# Patient Record
Sex: Male | Born: 1955
Health system: Southern US, Community
[De-identification: ages and names within clinical notes are randomized; demographics above are authoritative.]

## PROBLEM LIST (undated history)

## (undated) DIAGNOSIS — G629 Polyneuropathy, unspecified: Secondary | ICD-10-CM

## (undated) DIAGNOSIS — H269 Unspecified cataract: Secondary | ICD-10-CM

## (undated) DIAGNOSIS — E119 Type 2 diabetes mellitus without complications: Secondary | ICD-10-CM

## (undated) HISTORY — DX: Type 2 diabetes mellitus without complications: E11.9

## (undated) HISTORY — PX: BACK SURGERY: SHX140

## (undated) HISTORY — PX: EYE SURGERY: SHX253

## (undated) HISTORY — DX: Unspecified cataract: H26.9

## (undated) HISTORY — PX: TOTAL HIP ARTHROPLASTY: SHX124

## (undated) HISTORY — DX: Polyneuropathy, unspecified: G62.9

## (undated) HISTORY — PX: ANKLE SURGERY: SHX546

---

## 1976-04-27 HISTORY — PX: GASTROPLASTY: SHX192

## 1999-11-05 ENCOUNTER — Encounter: Admission: RE | Admit: 1999-11-05 | Discharge: 1999-11-05 | Payer: Self-pay | Admitting: Neurosurgery

## 1999-11-05 ENCOUNTER — Encounter: Payer: Self-pay | Admitting: Neurosurgery

## 1999-12-04 ENCOUNTER — Encounter: Payer: Self-pay | Admitting: Neurosurgery

## 1999-12-04 ENCOUNTER — Ambulatory Visit (HOSPITAL_COMMUNITY): Admission: RE | Admit: 1999-12-04 | Discharge: 1999-12-04 | Payer: Self-pay | Admitting: Neurosurgery

## 1999-12-18 ENCOUNTER — Encounter: Payer: Self-pay | Admitting: Neurosurgery

## 1999-12-18 ENCOUNTER — Ambulatory Visit (HOSPITAL_COMMUNITY): Admission: RE | Admit: 1999-12-18 | Discharge: 1999-12-18 | Payer: Self-pay | Admitting: Neurosurgery

## 2000-01-01 ENCOUNTER — Encounter: Payer: Self-pay | Admitting: Neurosurgery

## 2000-01-01 ENCOUNTER — Ambulatory Visit (HOSPITAL_COMMUNITY): Admission: RE | Admit: 2000-01-01 | Discharge: 2000-01-01 | Payer: Self-pay | Admitting: Neurosurgery

## 2006-08-01 ENCOUNTER — Emergency Department (HOSPITAL_COMMUNITY): Admission: EM | Admit: 2006-08-01 | Discharge: 2006-08-01 | Payer: Self-pay | Admitting: Emergency Medicine

## 2006-08-19 ENCOUNTER — Emergency Department (HOSPITAL_COMMUNITY): Admission: EM | Admit: 2006-08-19 | Discharge: 2006-08-20 | Payer: Self-pay | Admitting: Emergency Medicine

## 2006-08-23 ENCOUNTER — Ambulatory Visit: Payer: Self-pay | Admitting: Cardiovascular Disease

## 2006-08-23 ENCOUNTER — Inpatient Hospital Stay (HOSPITAL_COMMUNITY): Admission: EM | Admit: 2006-08-23 | Discharge: 2006-08-25 | Payer: Self-pay | Admitting: Emergency Medicine

## 2006-09-02 ENCOUNTER — Encounter: Admission: RE | Admit: 2006-09-02 | Discharge: 2006-09-02 | Payer: Self-pay | Admitting: Neurosurgery

## 2006-09-15 ENCOUNTER — Encounter: Admission: RE | Admit: 2006-09-15 | Discharge: 2006-09-15 | Payer: Self-pay | Admitting: Neurosurgery

## 2006-11-19 ENCOUNTER — Inpatient Hospital Stay (HOSPITAL_COMMUNITY): Admission: RE | Admit: 2006-11-19 | Discharge: 2006-11-24 | Payer: Self-pay | Admitting: Neurosurgery

## 2007-06-11 ENCOUNTER — Encounter: Admission: RE | Admit: 2007-06-11 | Discharge: 2007-06-11 | Payer: Self-pay | Admitting: Neurosurgery

## 2007-08-09 ENCOUNTER — Inpatient Hospital Stay (HOSPITAL_COMMUNITY): Admission: RE | Admit: 2007-08-09 | Discharge: 2007-08-12 | Payer: Self-pay | Admitting: Orthopaedic Surgery

## 2008-07-03 ENCOUNTER — Inpatient Hospital Stay (HOSPITAL_COMMUNITY): Admission: RE | Admit: 2008-07-03 | Discharge: 2008-07-07 | Payer: Self-pay | Admitting: Orthopaedic Surgery

## 2008-07-06 ENCOUNTER — Encounter (INDEPENDENT_AMBULATORY_CARE_PROVIDER_SITE_OTHER): Payer: Self-pay | Admitting: Orthopaedic Surgery

## 2008-07-06 ENCOUNTER — Ambulatory Visit: Payer: Self-pay | Admitting: Surgery

## 2010-05-18 ENCOUNTER — Encounter: Payer: Self-pay | Admitting: Neurosurgery

## 2010-08-07 LAB — GLUCOSE, CAPILLARY
Glucose-Capillary: 105 mg/dL — ABNORMAL HIGH (ref 70–99)
Glucose-Capillary: 109 mg/dL — ABNORMAL HIGH (ref 70–99)
Glucose-Capillary: 112 mg/dL — ABNORMAL HIGH (ref 70–99)
Glucose-Capillary: 116 mg/dL — ABNORMAL HIGH (ref 70–99)
Glucose-Capillary: 117 mg/dL — ABNORMAL HIGH (ref 70–99)
Glucose-Capillary: 126 mg/dL — ABNORMAL HIGH (ref 70–99)
Glucose-Capillary: 130 mg/dL — ABNORMAL HIGH (ref 70–99)
Glucose-Capillary: 146 mg/dL — ABNORMAL HIGH (ref 70–99)
Glucose-Capillary: 148 mg/dL — ABNORMAL HIGH (ref 70–99)
Glucose-Capillary: 150 mg/dL — ABNORMAL HIGH (ref 70–99)
Glucose-Capillary: 151 mg/dL — ABNORMAL HIGH (ref 70–99)
Glucose-Capillary: 153 mg/dL — ABNORMAL HIGH (ref 70–99)
Glucose-Capillary: 159 mg/dL — ABNORMAL HIGH (ref 70–99)
Glucose-Capillary: 173 mg/dL — ABNORMAL HIGH (ref 70–99)
Glucose-Capillary: 98 mg/dL (ref 70–99)

## 2010-08-07 LAB — BASIC METABOLIC PANEL
BUN: 12 mg/dL (ref 6–23)
BUN: 14 mg/dL (ref 6–23)
BUN: 15 mg/dL (ref 6–23)
BUN: 9 mg/dL (ref 6–23)
CO2: 29 mEq/L (ref 19–32)
CO2: 29 mEq/L (ref 19–32)
CO2: 29 mEq/L (ref 19–32)
Calcium: 8.2 mg/dL — ABNORMAL LOW (ref 8.4–10.5)
Calcium: 8.3 mg/dL — ABNORMAL LOW (ref 8.4–10.5)
Calcium: 8.3 mg/dL — ABNORMAL LOW (ref 8.4–10.5)
Calcium: 8.3 mg/dL — ABNORMAL LOW (ref 8.4–10.5)
Calcium: 8.4 mg/dL (ref 8.4–10.5)
Chloride: 100 mEq/L (ref 96–112)
Chloride: 95 mEq/L — ABNORMAL LOW (ref 96–112)
Chloride: 97 mEq/L (ref 96–112)
Creatinine, Ser: 0.77 mg/dL (ref 0.4–1.5)
Creatinine, Ser: 0.81 mg/dL (ref 0.4–1.5)
Creatinine, Ser: 0.86 mg/dL (ref 0.4–1.5)
Creatinine, Ser: 0.88 mg/dL (ref 0.4–1.5)
Creatinine, Ser: 0.94 mg/dL (ref 0.4–1.5)
GFR calc Af Amer: >60 mL/min (ref >60)
GFR calc Af Amer: >60 mL/min (ref >60)
GFR calc Af Amer: >60 mL/min (ref >60)
GFR calc non Af Amer: >60 mL/min (ref >60)
GFR calc non Af Amer: >60 mL/min (ref >60)
GFR calc non Af Amer: >60 mL/min (ref >60)
GFR calc non Af Amer: >60 mL/min (ref >60)
Glucose, Bld: 101 mg/dL — ABNORMAL HIGH (ref 70–99)
Glucose, Bld: 122 mg/dL — ABNORMAL HIGH (ref 70–99)
Glucose, Bld: 135 mg/dL — ABNORMAL HIGH (ref 70–99)
Potassium: 3.8 mEq/L (ref 3.5–5.1)
Potassium: 4 mEq/L (ref 3.5–5.1)
Potassium: 4.1 mEq/L (ref 3.5–5.1)
Sodium: 132 mEq/L — ABNORMAL LOW (ref 135–145)
Sodium: 132 mEq/L — ABNORMAL LOW (ref 135–145)

## 2010-08-07 LAB — PROTIME-INR
INR: 1 (ref 0.00–1.49)
INR: 1.3 (ref 0.00–1.49)
INR: 2.3 — ABNORMAL HIGH (ref 0.00–1.49)
INR: 2.4 — ABNORMAL HIGH (ref 0.00–1.49)
Prothrombin Time: 13.8 seconds (ref 11.6–15.2)
Prothrombin Time: 16.3 seconds — ABNORMAL HIGH (ref 11.6–15.2)
Prothrombin Time: 26.6 seconds — ABNORMAL HIGH (ref 11.6–15.2)
Prothrombin Time: 27.5 seconds — ABNORMAL HIGH (ref 11.6–15.2)

## 2010-08-07 LAB — CROSSMATCH: ABO/RH(D): O POS

## 2010-08-07 LAB — URINE CULTURE
Colony Count: NO GROWTH
Colony Count: NO GROWTH

## 2010-08-07 LAB — COMPREHENSIVE METABOLIC PANEL
ALT: 9 U/L (ref 0–53)
Alkaline Phosphatase: 90 U/L (ref 39–117)
BUN: 14 mg/dL (ref 6–23)
CO2: 27 mEq/L (ref 19–32)
GFR calc non Af Amer: >60 mL/min (ref >60)
Glucose, Bld: 140 mg/dL — ABNORMAL HIGH (ref 70–99)
Potassium: 4 mEq/L (ref 3.5–5.1)
Sodium: 138 mEq/L (ref 135–145)
Total Protein: 6.8 g/dL (ref 6.0–8.3)

## 2010-08-07 LAB — CBC
HCT: 27.4 % — ABNORMAL LOW (ref 39.0–52.0)
HCT: 28.8 % — ABNORMAL LOW (ref 39.0–52.0)
HCT: 40 % (ref 39.0–52.0)
Hemoglobin: 13.6 g/dL (ref 13.0–17.0)
Hemoglobin: 9.6 g/dL — ABNORMAL LOW (ref 13.0–17.0)
MCHC: 33.5 g/dL (ref 30.0–36.0)
MCHC: 33.9 g/dL (ref 30.0–36.0)
MCHC: 34.1 g/dL (ref 30.0–36.0)
MCV: 79.1 fL (ref 78.0–100.0)
Platelets: 153 10*3/uL (ref 150–400)
Platelets: 182 10*3/uL (ref 150–400)
RBC: 3.64 MIL/uL — ABNORMAL LOW (ref 4.22–5.81)
RBC: 3.68 MIL/uL — ABNORMAL LOW (ref 4.22–5.81)
RBC: 3.88 MIL/uL — ABNORMAL LOW (ref 4.22–5.81)
RBC: 5.16 MIL/uL (ref 4.22–5.81)
RDW: 15.8 % — ABNORMAL HIGH (ref 11.5–15.5)
RDW: 16.2 % — ABNORMAL HIGH (ref 11.5–15.5)
RDW: 16.3 % — ABNORMAL HIGH (ref 11.5–15.5)
WBC: 6.8 10*3/uL (ref 4.0–10.5)
WBC: 8 10*3/uL (ref 4.0–10.5)
WBC: 8.3 10*3/uL (ref 4.0–10.5)
WBC: 9.2 10*3/uL (ref 4.0–10.5)

## 2010-08-07 LAB — APTT: aPTT: 35 seconds (ref 24–37)

## 2010-08-07 LAB — URINALYSIS, ROUTINE W REFLEX MICROSCOPIC
Hgb urine dipstick: NEGATIVE
Ketones, ur: 15 mg/dL — AB
Nitrite: NEGATIVE
Protein, ur: NEGATIVE mg/dL
Urobilinogen, UA: 1 mg/dL (ref 0.0–1.0)

## 2010-08-07 LAB — DIFFERENTIAL
Basophils Absolute: 0 10*3/uL (ref 0.0–0.1)
Basophils Relative: 0 % (ref 0–1)
Eosinophils Absolute: 0 10*3/uL (ref 0.0–0.7)
Neutro Abs: 6.4 10*3/uL (ref 1.7–7.7)
Neutrophils Relative %: 67 % (ref 43–77)

## 2010-08-07 LAB — OSMOLALITY: Osmolality: 275 mOsm/kg (ref 275–300)

## 2010-08-07 LAB — URINE MICROSCOPIC-ADD ON

## 2010-08-07 LAB — URINALYSIS, MICROSCOPIC ONLY
Bilirubin Urine: NEGATIVE
Glucose, UA: NEGATIVE mg/dL
Hgb urine dipstick: NEGATIVE
Protein, ur: 30 mg/dL — AB

## 2010-09-09 NOTE — Op Note (Signed)
[name], [name] NO.:  [id number]   MEDICAL RECORD NO.:  [id number]          PATIENT TYPE:  INP   LOCATION:  5006                         FACILITY:  [facility]   PHYSICIAN:  Claude Manges. Whitfield, M.D.DATE OF BIRTH:  07-01-1955   DATE OF PROCEDURE:  08/09/2007  DATE OF DISCHARGE:                               OPERATIVE REPORT   PREOPERATIVE DIAGNOSIS:  End-stage osteoarthritis, left hip.   POSTOPERATIVE DIAGNOSIS:  End-stage osteoarthritis, left hip.   PROCEDURE:  Left total hip arthroplasty.   SURGEON:  Claude Manges. Cleophas Dunker, MD   ASSISTANT:  Lenard Galloway. Chaney Malling, MD  Oris Drone Petrarca, PA-C   ANESTHESIA:  General orotracheal.   COMPLICATIONS:  None.   COMPONENTS:  DePuy AML 16.5 large femoral stem, a 36 mm diameter hip  ball with a 8.5 mm neck length, and a 100 series 60 mm outer diameter  metal acetabular cup with a +4 polyethylene liner with a 10-degree  posterior lip.  Components were press fit.   PROCEDURE:  The patient was met in the holding area.  The left lower  extremity was marked as the appropriate operative extremity.  Any  questions were answered.  The patient was then taken to Room 15.  He was  placed under general orotracheal anesthesia.  Nursing staff inserted a  Foley catheter.  Urine was clear.   The patient was then placed in the lateral decubitus position with the  left side up and secured to the operating room table with the Innomed  hip system.   A routine southern incision was utilized via sharp dissection carried on  the subcutaneous tissue.  Abundant adipose tissue was then incised to  the level of the iliotibial band.  This was incised along the skin  incision.  Self-retaining retractors were inserted.  Detectably, the  procedure was difficult as the patient had a fixed flexion and external  rotation contracture.  The short external rotator tendons were  identified with some difficulty and then tagged with 0 Ethibond suture.  The  capsule was identified, incised along the femoral neck and head.  The head was then dislocated posteriorly.  The head was significantly  malformed.  There was over 75% of the femoral head that was devoid of  articular cartilage.   Using the calcar guide, the neck was osteotomized at a point of just  over a fingerbreadth proximal to the lesser tuberosity.   Piriformis fossa was identified.  A starter hole was then made.  The  canal finder was inserted.  Reaming was performed to 16 mm for a 16.5 mm  femoral component.  At that point, a good endosteal purchase.   Rasping was performed sequentially to 16.5 mm.  Calcar reamer was used  to obtain the appropriate calcar angle.  It had a very nice fit and no  toggling.   Retractor was then placed about the acetabulum.  The labrum was  degenerative and torn.  It was sharply excised.  There were large areas  of articular cartilage loss and osteophytes that were removed.  Reaming  was performed sequentially to 59  mm to accept a 60 mm component.  We had  very nice bleeding bone circumferentially.   We trialed a 58 and had good rim fit.  Accordingly, we proceeded to  insert the 60 mm outer diameter 100 series AML metallic cup in what we  felt was appropriate anteversion and flexion.  We had very nice purchase  and we were able to completely seat the component.   The trial +4 polyethylene liner was inserted.  We reinserted the 16.5 mm  large stature femoral rasp and then we tried several neck lengths and  felt that the 8.5 established leg length equality and there was no  instability whatsoever.   The trial components were then removed.  The wound was copiously  irrigated with saline solution.  The apex hole eliminator was inserted  followed by the final +4 10-degree posterior lip polyethylene liner.   The wound was irrigated.  There were several small osteophytes about the  acetabulum that were sharply excised.   The 16.5 mm large stature  femoral component was then impacted in about  20 degrees of anteversion and fit perfectly on the calcar.   We again trialed the 8.5 mm neck length with a 36 mm hip ball.  It was a  perfect fit and there was no instability.   Accordingly, the trial ball was removed.  The Morris-tapered neck was  then cleaned and the final 36 mm diameter with an 8.5 mm neck length was  then impacted on the Morris taper.  The wound was irrigated.  The  acetabulum cleaned.  The entire construct was then reduced again through  a full range of motion.  We could not sublux the hip.   The wound was again irrigated with saline solution.  The capsule was  closed with interrupted #1 Ethibond.  Short external rotators were then  closed with the same material.  The iliotibial band was closed with  running 0 Vicryl, the subcu in several layers of 0 and 2-0 Vicryl, skin  closed with skin clips.  Sterile bulky dressing was applied followed by  a sterile drapes followed by Mepilex dressing.  Marcaine was injected  around the wound edges.   The patient was then returned to the postanesthesia recovery in  satisfactory condition without complications.      Claude Manges. Cleophas Dunker, M.D.  Electronically Signed     PWW/MEDQ  D:  08/09/2007  T:  08/10/2007  Job:  [id number]

## 2010-09-09 NOTE — Op Note (Signed)
NAMEANTONIOUS, [name] NO.:  [id number]   MEDICAL RECORD NO.:  [id number]          PATIENT TYPE:  INP   LOCATION:  3039                         FACILITY:  [facility]   PHYSICIAN:  Payton Doughty, M.D.      DATE OF BIRTH:  1955-12-03   DATE OF PROCEDURE:  11/19/2006  DATE OF DISCHARGE:                               OPERATIVE REPORT   PREOPERATIVE DIAGNOSES:  L3 fracture, right L3 and L4 radiculopathies.   POSTOPERATIVE DIAGNOSES:  L3 fracture, right L3 and L4 radiculopathies.   PROCEDURE:  Right L3 and L4 laminectomy with lateral recess  decompression, L1-L4 segmental pedicle screws and L1-L4 left lamina and  posterolateral arthrodesis.   SERVICE:  Neurosurgery.   ANESTHESIA:  General endotracheal.   PREPARATION:  Prepped and scrubbed with alcohol wipe.   COMPLICATIONS:  None.   NURSE ASSISTANCE:  [place].   DOCTOR ASSISTANCE:  Phoebe Perch.   This is a 55 year old gentleman with an old L3 fracture, lateral recess  narrowing on the right side at L3-4 and L2-3 and right L3 and L4  radiculopathies. Taken to the operating room, smoothly anesthetized,  intubated, placed prone on the operating table. Following shave, prep,  and drape in the usual sterile fashion, the skin was infiltrated with 1%  lidocaine with 1:400,000 epinephrine.  The skin was incised from the top  of L5 to the top of L1 and the lamina and transverse processes of L2, L3  and L4 were exposed bilaterally in the subperiosteal plane.  Intraoperative x-ray confirmed correct level. Having confirmed correct  level,  hemilaminectomy of L3 and L4 was carried out using the high-  speed drill and the Kerrison.  This allowed removal of the abundant  redundant ligamentum flavum on the right side, undercutting of the  spinous process and generous decompression.  The L2, L3 and L4 roots  were identified and decompressed and the neural foramina carefully  explored and found to be open.  Pedicle screws were then  placed in L1,  L2, L3 and L4.  Intraoperative x-rays showed good placement of pedicle  screws.  A rod was contoured to fit across them and they were capped.  Final tightening was undertaken, the high-speed drill was used to  decorticate the left-sided lamina of L1, L2, L3 and L4 as well as the  transverse process of L1, L2, L3 and L4. BMP on the extender  matrix was laid across this as arthrodesis.  The final x-ray showed good  placement of rods and screws.  Successive layers of #0 Vicryl, 2-0  Vicryl and 3-0 nylon were used to close.  Betadine Telfa dressing was  applied, made occlusive with OpSite and the patient returned to recovery  room in good condition.           ______________________________  Payton Doughty, M.D.     MWR/MEDQ  D:  11/19/2006  T:  11/20/2006  Job:  [id number]

## 2010-09-09 NOTE — Discharge Summary (Signed)
NAME:  [name], [name] NO.:  [id number]   MEDICAL RECORD NO.:  [id number]          PATIENT TYPE:  INP   LOCATION:  A210                          FACILITY:  [facility]   PHYSICIAN:  Marcello Moores, MD   DATE OF BIRTH:  10-02-1955   DATE OF ADMISSION:  08/23/2006  DATE OF DISCHARGE:  04/30/2008LH                               DISCHARGE SUMMARY   ADMISSION AND HOSPITAL COURSE:  Mr. [name] is a 55 year old male with a  medical history of diabetes mellitus, who presents with chest  discomfort, palpitations, and sweating.  He presented to ER with this  problem and he was admitted with atypical chest pain to rule out acute  coronary syndrome.  The cardiac enzymes were within normal range.  EKG  was also normal.  Chest x-ray was also normal.  He was assessed by the  cardiologist as well and echocardiogram was done which was normal, and  stress test was done and the result is pending but he was discharged by  the cardiologist, and he is ready to go home.  In his hospital stay, his  blood pressure remained on the high range and was started on metoprolol  and hydrochlorothiazide by the cardiologist, and I will discharge him  with metoprolol and hydrochlorothiazide, and to have follow-up with PMD  and cardiologist.   PHYSICAL EXAMINATION:  VITAL SIGNS:  Blood pressure is 110/64,  temperature is 97, pulse rate is 53, respiratory rate is 20.  Blood  glucose level is 152.  Saturation is 98%  HEENT:  He has pink conjunctivae, nonicteric sclerae.  CHEST:  Good air entry.  CVS:  S1, S2 regular, no murmur.  ABDOMEN:  Soft.  EXTREMITIES:  No pedal edema.  CNS: Alert and oriented.   LABS:  CBC within normal range.  Glucose level was 139, otherwise other  chemistries are normal.   ASSESSMENT:  1. Atypical chest pain.  Acute coronary syndrome was ruled out.  2. Diabetes mellitus.  3. Hypertension.   He will be discharged with medication and will have follow-up.   MEDICATIONS:  1,   Glucophage 1000 mg b.i.d.  1. Lortab 10 mg four times a day.  2. Metoprolol 25 mg t.i.d.  3. Hydrochlorothiazide 12.5 mg p.o. daily.   The patient is well advised about the medications, and he needs regular  follow-up with his PMD and with a cardiologist.      Marcello Moores, MD  Electronically Signed     MT/MEDQ  D:  08/25/2006  T:  08/26/2006  Job:  [id number]

## 2010-09-09 NOTE — Consult Note (Signed)
[name], [name]               ACCOUNT NO.:  [id number]   MEDICAL RECORD NO.:  [id number]          PATIENT TYPE:  INP   LOCATION:  5006                         FACILITY:  [facility]   PHYSICIAN:  Pramod P. Pearlean Brownie, MD    DATE OF BIRTH:  10/18/1955   DATE OF CONSULTATION:  DATE OF DISCHARGE:                                 CONSULTATION   REFERRING PHYSICIAN:  Demetria Pore. Coral Spikes, M.D.   REASON FOR REFERRAL:  Hand tremor.   HISTORY OF PRESENT ILLNESS:  [name] is a 55 year old Caucasian male  who has been having intermittent upper extremity hand tremors for the  last 1 week.  He describes this as being mainly task specific, when he  is holding some objects in his hand he feels subtle jerking movement.  He has not been dropping objects.  He denies significant tremors at  rest.  There is no pill rolling quality, no drooling of saliva, no  change in his voice, stooping gait.  No family history of tremors.  The  patient says at times he has noticed jerky movement in his legs as well.  There is no head, neck, voice or jaw tremor noted.  He does take Lortab  3 tablets a day as well as Neurontin three times a day for chronic back  pain.   PAST MEDICAL HISTORY:  Significant for hypertension, diabetes with  chronic back pain status post back surgery by Dr. Channing Mutters and now left hip  surgery 2 days ago by Dr. Cleophas Dunker.   MEDICATIONS:  Medication list at home includes Lortab 1 tablet three  times a day, and Flexeril 10 mg three times a day, Glucophage daily.   PAST SURGICAL HISTORY:  Left knee surgery, right ankle surgery, stomach  surgery, back surgery.   SOCIAL HISTORY:  The patient is divorced.  He is on disability.  He is  currently not smoking or drinking.   PHYSICAL EXAMINATION:  GENERAL:  Reveals a pleasant, obese Caucasian  male who is at present not in distress.  He is afebrile, pulse rate is  70 per minute, regular, respiratory rate 16 per minute.  Distal pulses  are well felt.  HEAD:   Nontraumatic.  NECK:  Supple.  There is no bruit.  ENT:  Unremarkable.  CARDIAC:  No murmur or gallop.  LUNGS:  Clear to auscultation.  ABDOMEN:  Soft, nontender.  NEUROLOGIC:  Pleasant, awake, alert, cooperative.  There is no aphasia,  or dysarthria.  Pupils are equal, reactive.  Eye movements are full  range with no nystagmus.  Face is symmetric.  Palatal movements normal.  Tongue is midline.  Motor system exam reveals no upper extremity drift.  He has symmetric strength, tone, reflexes, coordination and sensation.  He has intermittent asterixis and flapping tremor of outstretched upper  extremities.  I did not see this in the lower extremities.  There is no  pill-rolling tremor.  There is no cogwheel rigidity.  Glabellar tap is  negative.  He can move all four extremities equally.  Gait was not  tested.  Left hip exam is limited due  to pain.   DATA REVIEWED:  The hospital chart is reviewed.   IMPRESSION:  A 52-year gentleman with 1-week history of intermittent  jerky upper extremity nonspecific tremors which seems like asterixis,  which may be medication effect from his narcotics and Neurontin.  Had  doubt this represents benign essential tremor or Parkinson's disease.   PLAN:  I would recommend to stopping the narcotics pain medication,  Lortab as well as Neurontin.  Use non-narcotic pain medications if  possible.  Check basic metabolic panel, labs, liver function tests,  ammonia levels.  If pain medications are necessary, use nonsteroidal  anti-inflammatory pain medications.  I would be happy to follow the  patient in consults.  Kindly call for questions.           ______________________________  Sunny Schlein. Pearlean Brownie, MD     PPS/MEDQ  D:  08/11/2007  T:  08/12/2007  Job:  [id number]

## 2010-09-09 NOTE — Procedures (Signed)
NAMEJOAB, CARDEN NO.:  [id number]   MEDICAL RECORD NO.:  [id number]          PATIENT TYPE:  INP   LOCATION:  A210                          FACILITY:  [facility]   PHYSICIAN:  Noralyn Pick. [name], MD, FACCDATE OF BIRTH:  July 28, 1955   DATE OF PROCEDURE:  DATE OF DISCHARGE:                                ECHOCARDIOGRAM   INDICATIONS:  Chest pain, diabetic.   Left ventricular cavity size was normal.  There was mild LVH with some  basal septal prominence.  Septal thickness was approximately 16 mm.  There was no outflow tract gradient.  There were no regional wall motion  abnormalities.  EF was 60%.  There was mild annular calcifications which  reveal MR.  There was mild left atrial enlargement.  The aortic valve  was trileaflet and sclerotic.  There was no stenosis.  Right-sided  cardiac chambers were normal with no evidence of pulmonary hypertension.  Subcostal imaging revealed no atrial septal defect, no source of  embolus, and no effusion.   M-Mode measurements, including aortic dimension at 36 mm.  Left atrial  dimension, 41 mm.  Septal thickness 16 mmHg.  LV diastolic dimension 37  mm.  LV systolic dimension 26 mm.   FINAL IMPRESSION:  1. Poor acoustical windows.  2. Mild left ventricular hypertrophy, ejection fraction of 60%.  No      wall motion abnormalities.  3. Mild annular calcifications with trace mitral regurgitation.  4. Aortic valve sclerosis.  5. Mild left atrial enlargement.  6. Normal right-sided cardiac chambers.  7. No pericardial effusion.      Noralyn Pick. [name], MD, [facility]  Electronically Signed     PCN/MEDQ  D:  08/24/2006  T:  08/24/2006  Job:  [id number]

## 2010-09-09 NOTE — H&P (Signed)
NAMEORREN, PIETSCH NO.:  [id number]   MEDICAL RECORD NO.:  [id number]          PATIENT TYPE:  INP   LOCATION:  A210                          FACILITY:  [facility]   PHYSICIAN:  Dorris Singh, DO    DATE OF BIRTH:  1955/11/29   DATE OF ADMISSION:  08/23/2006  DATE OF DISCHARGE:  LH                              HISTORY & PHYSICAL   The patient is a 55 year old, Caucasian male who presented to the ER  with a complaint of chest pain. The patient stated he was at home,  started to feel pressure and started to sweat and decided that it was  time for him to get that evaluated. While he was watching television,  the onset was acute and he had one episode. The pattern was persistent.  The patient stated that his whole body felt heavy and the symptoms were  described as moderate. He did not have anything that relieved the pain  and it was associated with diaphoresis, but he did not have nausea and  vomiting. The patient has a significant history for diabetes and a  family history for cardiovascular disease.   PAST MEDICAL HISTORY:  Diabetes, chronic back pain.   PAST SURGICAL HISTORY:  Cholecystectomy, knee surgery, gastroplasty and  ankle surgery.   SOCIAL HISTORY:  Nonsmoker, nondrinker and the patient denies any drug  use.   ALLERGIES:  Vespids aka bee sting.   CURRENT MEDICATIONS:  The patient current is on 2 medications:  1. Glucophage oral 100 mg b.i.d.  2. Lortab 10 mg 4 times a day.   REVIEW OF SYSTEMS:  Negative for fever, weight loss or appetite changes.  EYES:  Negative for eye pain or changes in vision. HEENT:  Negative for  ear pain, nose pain or drainage. CARDIOVASCULAR:  Positive for chest  pain, palpitations, diaphoresis. Negative for dyspnea. RESPIRATORY:  Negative for cough, dyspnea or productive cough. GASTROINTESTINAL:  Negative for nausea, vomiting, diarrhea or constipation.  MUSCULOSKELETAL: Positive for back pain. SKIN:  Negative pruritus,  rash  or abrasions. NEUROLOGIC:  Positive for light-headedness, negative for  headache.   PHYSICAL EXAMINATION:  VITAL SIGNS:  On the floor, the patient's vitals  are 97.4, 77, 20, 167/109 and that was rechecked 30 minutes later and  was found to be 147/92. Room air saturation at 95%. The patient is 75  inches tall and 116.7 kg.  GENERAL:  This is a well-developed, well-nourished, Caucasian male who  is appropriate.  HEENT:  Head is normocephalic, atraumatic. Eyes are normal in  appearance, symmetrical. EOMI, PERRLA. Ears, nose, throat, mouth and  pharynx normal. Teeth are in poor repair.  NECK:  Supple, nontender, no lymphadenopathy.  CARDIOVASCULAR:  Regular rate and rhythm, no murmur, rub or gallop. S1,  S2 present.  RESPIRATORY:  Normal breath sounds, equal bilaterally. No rales, rhonchi  or wheezes. Diminished breath sounds.  ABDOMEN:  Soft, nontender, nondistended. Positive midline scar as well  as scars on the right side indicative of cholecystectomy.  EXTREMITIES:  No abnormality, full range of motion, positive +1 edema  bilaterally with multiple abrasions on lower  legs.  SKIN:  Normal color, normal turgor, no rash or petechia noted.   EKG was done, rate was 64, normal sinus rhythm, normal axis, normal  interval, normal P, QRS and normal T wave. Radiology did a chest x-ray,  no acute changes.   LABORATORY DATA:  Sodium 136, potassium 4.2, chloride 105, carbon  dioxide 24, glucose 163, BUN 13, creatinine 0.71. Cardiac markers were  done. CK-MB was less than 1, myoglobin was 40.4, troponin was 0.05. CBC  was done, white count 7.7, hemoglobin 15.3, hematocrit 43.9. Platelets  207.   ASSESSMENT/PLAN:  The patient was admitted to the [facility] service for  a diagnosis of  chest pain  diabetes.  We will have [place] to consult and participate regarding his care. The  patient had 3 sets of normal enzymes. Will continue to monitor him. We  have sublingual nitroglycerin if the  chest pain returns and will do labs  in the morning.      Dorris Singh, DO  Electronically Signed     CB/MEDQ  D:  08/24/2006  T:  08/24/2006  Job:  [id number]

## 2010-09-09 NOTE — Discharge Summary (Signed)
NAMERADWAN, COWLEY NO.:  [id number]   MEDICAL RECORD NO.:  [id number]          PATIENT TYPE:  INP   LOCATION:  3039                         FACILITY:  [facility]   PHYSICIAN:  Payton Doughty, M.D.      DATE OF BIRTH:  02-Dec-1955   DATE OF ADMISSION:  11/19/2006  DATE OF DISCHARGE:  11/24/2006                               DISCHARGE SUMMARY   ADMITTING DIAGNOSIS:  L3 fracture.   DISCHARGE DIAGNOSIS:  L3 fracture.   PROCEDURE:  L1-L4 fusion.   DICTATING DOCTOR:  Dr. Channing Mutters.   SERVICES:  Neurosurgery.   BODY OF TEXT:  This is a 55 year old, right-handed, gentleman, whose  history and physical is recounted in the chart.  He has had L3 fracture  for numerous years and has developed back pain, right leg associated  with it.  Otherwise, in reasonably good health.  Strength is full.  He  was admitted after ascertainment of  normal __________ values and  underwent a segmental fusion from L1-L4.  Postoperatively, he has done  well.  Mobilizing with PT.  Foley was removed.  He says his leg pain is  gone and his incision has had some leakage, but is now dry.  He has  remained afebrile.  His other vital signs are stable.  He is up and  about, independent in his activities of daily living.  He is being  discharged home in the care of his family with Percocet for pain and 5  days of Cipro for his skin coverage.           ______________________________  Payton Doughty, M.D.     MWR/MEDQ  D:  11/24/2006  T:  11/24/2006  Job:  [id number]

## 2010-09-09 NOTE — Consult Note (Signed)
[name], GILL NO.:  [id number]   MEDICAL RECORD NO.:  [id number]          PATIENT TYPE:  INP   LOCATION:  A210                          FACILITY:  [facility]   PHYSICIAN:  Noralyn Pick. [name], MD, FACCDATE OF BIRTH:  1955-05-24   DATE OF CONSULTATION:  DATE OF DISCHARGE:                                 CONSULTATION   REASON FOR CONSULT:  Chest pain.   HISTORY OF PRESENT ILLNESS:  The patient is a 55 year old patient  admitted by [facility] yesterday evening.  He presented to the ER with the  complaints of diaphoresis, a pressure sensation in his epigastric and  chest area, and actually a feeling of things closing in on him.  The  patient's chest pain complaints were actually somewhat minor compared to  an overwhelming feeling of weakness, particularly in his legs,  diaphoresis, and almost a claustrophobic-like feeling.   The patient has not had this happen before.  When he was evaluated, his  blood sugar was 160 and his blood pressure was somewhat elevated.  He  was initially watching television and had the acute onset of this  feeling.  It was persistent and he called his girlfriend, who is an OR  nurse, and drove himself to the hospital.   The patient apparently felt relief with Demerol.   He did not have any associated nausea or vomiting.  There is no previous  history of cardiac disease.  He does have a history of diabetes and  family history for coronary artery disease.  His initial evaluation in  the ER was negative, with negative for cardiac enzymes and no  arrhythmia.  The patient essentially felt better after his Demerol;  however, he continues to feel somewhat fatigued this morning.   PAST MEDICAL HISTORY:  His past medical history is remarkable for  diabetes, which he has had for about 2 years.  It is orally treated.  He  has chronic lower back pain and actually is to see Dr. Channing Mutters later this  week.  I am not sure if any surgery is planned.  He has had  a previous  cholecystectomy, knee surgery, gastroplasty, and right ankle surgery.   SOCIAL HISTORY:  The patient is a nonsmoker, nondrinker.  He is  disabled.  His girlfriend is an Scientist, forensic.  He is otherwise fairly  sedentary.  He does [place] a [place] of  [place].   ALLERGIES:  BEE STINGS.   MEDICATIONS:  1. He is only taking Glucophage 1 gram b.i.d.  2. Lortab 10 mg 4 times a day.   REVIEW OF SYSTEMS:  His review of systems is otherwise negative, except  for as indicated in the HPI.   PHYSICAL EXAMINATION:  GENERAL:  Physical examination is remarkable for  an overweight white male.  He is in no distress.  VITAL SIGNS:  Blood pressure is currently 140/70.  His room air  saturations are 100%.  He is in sinus rhythm, with a rate of 75.  HEENT:  Normal.  There is no thyromegaly.  No carotid  bruits.  LUNGS:  Lungs are clear.  HEART:  S1, S2.  Normal heart sounds.  ABDOMEN:  He has a vertical gastroplasty scar and a horizontal  cholecystectomy scar.  There is no triple-A.  No epigastric pain.  No  hepatosplenomegaly.  EXTREMITIES:  Distal pulses are intact, with no edema.  NEUROLOGIC:  Nonfocal.  SKIN:  Warm and dry.   DATA:  His EKG is normal.  His chest x-ray shows no active disease.  His  laboratory work is remarkable for 2 sets of negative enzymes.  His  hematocrit is 39.8.  Platelets are 178,000.  White count is normal.  Potassium is 4.1, glucose 139, BUN 10, creatinine 0.6.   IMPRESSION:  Atypical presentation in a diabetic.  Apparently the only  objective findings were somewhat of a high sugar and high blood  pressure.   The patient is a diabetic.  He is currently not on a statin drug or an  angiotensin-converting enzyme inhibitor.  He should probably be on both.  The angiotensin-converting enzyme inhibitor would help with his labile  blood pressure and decreased proteinuria in regards to nephrotoxic  effects of his diabetes.  Certainly he  deserves to have a Myoview given  his lower back pain.  I do not think he can walk.  There is also a  question of needing upcoming back surgery and this would help clear him  for surgery.  I will try to arrange his adenosine Myoview for later  today or tomorrow.   He will also have a 2D echocardiogram to rule out regional wall motion  abnormalities.  This will hopefully be done today.   His symptoms are somewhat of an enigma.  I do not think they represent  an acute cardiac event; however, I think his risk factors probably need  tighter control.  The primary service should order a hemoglobin A1C  while he is in the hospital to see how good his sugars have been  controlled.   Further recommendations will be based on the results of his  echocardiogram and Myoview, as well as his clinical course.   I took the liberty of starting him on 10 mg of lisinopril here in the  hospital, and we will have to monitor the effects of this on his  creatinine and blood pressure.      Noralyn Pick. [name], MD, [facility]  Electronically Signed     PCN/MEDQ  D:  08/24/2006  T:  08/24/2006  Job:  [id number]

## 2010-09-09 NOTE — Op Note (Signed)
NAMEADEMIDE, SCHABERG NO.:  [id number]   MEDICAL RECORD NO.:  [id number]          PATIENT TYPE:  INP   LOCATION:  5002                         FACILITY:  [facility]   PHYSICIAN:  Claude Manges. Whitfield, M.D.DATE OF BIRTH:  1955-07-18   DATE OF PROCEDURE:  DATE OF DISCHARGE:                               OPERATIVE REPORT   PREOPERATIVE DIAGNOSIS:  End-stage osteoarthritis, right hip.   POSTOPERATIVE DIAGNOSIS:  End-stage osteoarthritis, right hip.   PROCEDURE:  Right total hip replacement.   COMPLICATIONS:  None.   COMPONENTS:  DePuy AML 16.5-mm large femoral component, a 60-mm outer  diameter 100 series metallic acetabular cup with a Marathon 10-degree  posterior lip liner, a 36-mm outer diameter hip ball with a 8.5-mm neck  length, all were press fit.   PROCEDURE:  With the patient comfortable on the operating table and  under general orotracheal anesthesia, nursing staff inserted a Foley  catheter.  The patient was then placed in the lateral decubitus position  with the right side up and secured to the operating room table with the  Innomed hip system.  The right lower extremity had been identified as  the appropriate extremity preoperatively.   The right hip was then prepped with Betadine scrub and then DuraPrep  from the iliac crest to the midcalf.  Sterile draping was then  performed.   A routine southern incision was utilized and via sharp dissection  carried down to the subcutaneous tissue.  There was abundant adipose  tissue that was incised with the Bovie.  Self-retaining retractors were  inserted.  The iliotibial band was identified, incised along the length  of the skin incision.  East-West retractor was then inserted.  With the  hip internally rotated with some difficulty related to the  osteoarthritis, short external rotators were identified.  Tendinous  structures were tagged with 0 Ethilon suture.  The external rotators  were then released in the  posterior aspect of the greater trochanter.  The capsule was identified.  It was incised along the femoral neck and  head.  There was a serosanguineous effusion.   The head was then dislocated posteriorly with some difficulty related to  the large osteophytes.   The head was identified.  It was oblong.  There were large areas of  articular cartilage loss and large osteophytes.   The head was then osteotomized using the calcar guide and then removed  from the wound.   A center hole was then made in the piriformis fossa with the reamer.  The canal finder was inserted.  Reaming was performed to 16 mm to accept  a 16.5 component.  The patient had a prior left total hip replacement  with a 16.5 component, thought this was a perfect fit.   Rasping was then performed sequentially to a 16.5 large component.  Femoral neck was osteotomized about 1 cm proximal to the lesser  trochanter.  We had a very nice fit and there were no further  osteophytes.   Retractor was then placed about the acetabulum.  The labrum was  identified circumferentially with some tearing.  This was removed.  Osteophytes were removed as well.  Reaming was performed sequentially to  59 mm to accept a 60-mm component.  This had been measured  preoperatively and it was also the same size as the opposite hip.  We  had very nice bleeding and excellent depth.   We trialed a 58 component with excellent rim fit and it would completely  seat.  The 60 mm had nice rim fit, but would not completely seat.   The 100 series metallic AML acetabular component was then impacted into  the hip.  We inserted the +4 neck length and screwed it in place.  With  the 16.5 large rasp in place that we trialed several neck lengths.  We  thought we were unstable even with an 11-mm neck length and accordingly  removed all the trial components and repositioned the acetabular  component in more flexion and abduction.  With this new construct, the   hip was perfectly stable with an 8.5-mm neck length.   All the trial components were removed.  We further impacted the  acetabular component, maybe a millimeter, it was perfectly stable.  We  irrigated it and inserted a central hole filler and then impacted the  Marathon polyethylene liner with 10-degrees posterior lip.  The wound  was then irrigated again as well as the femoral canal.  This 16.5-mm  femoral stem was then impacted flush on the calcar at about 15 degrees  of anteversion.  We again trialed a 8.5-mm neck length and felt that it  was perfectly stable.  It was removed and the [facility] taper neck cleaned,  and the final 36-mm outer diameter hip ball with an 8.5-mm neck length  was then impacted.  The acetabulum was inspected and was clear of any  loose material.  It was again irrigated.  The final construct was then  reduced and through a full range of motion we had no instability and we  thought that the leg lengths were symmetrical.  He was approximately a  centimeter short preoperatively.   The wound was again irrigated with saline solution.  The capsule was  closed anatomically with #1 Ethibond.  The short external rotators were  closed with similar material.  I was able to palpate the sciatic nerve  throughout the procedure and was intact.   The iliotibial band was closed with a running #1 Vicryl.  The subcu was  closed in several layers with 0 and 2-0 Vicryl, skin closed with skin  clips.  Sterile bulky dressing was applied.  We did inject Marcaine with  epinephrine into the wound edges and the deep capsule.  The patient was  awoken and carefully placed on the operating room stretcher and returned  to the postanesthesia recovery room in satisfactory condition.      Claude Manges. Cleophas Dunker, M.D.  Electronically Signed     PWW/MEDQ  D:  07/03/2008  T:  07/03/2008  Job:  [id number]

## 2010-09-09 NOTE — H&P (Signed)
NAMESHIMSHON, NARULA NO.:  [id number]   MEDICAL RECORD NO.:  [id number]          PATIENT TYPE:  INP   LOCATION:  3172                         FACILITY:  [facility]   PHYSICIAN:  Payton Doughty, M.D.      DATE OF BIRTH:  December 30, 1955   DATE OF ADMISSION:  11/19/2006  DATE OF DISCHARGE:                              HISTORY & PHYSICAL   ADMISSION DIAGNOSIS:  L3 fracture.   SERVICE:  Neurosurgery.   A very nice 55 year old right-handed white gentleman who for numerous  years had pain in his back, worse in his right leg but also gets in the  left.  He has numbness of both feet.  He has had some pain in his neck  and in the left shoulder.  Epidurals have not helped.  MR shows stenosis  at 3-4, 2-3, as well as his fracture through the vertebral body of L3.  He is admitted now for decompression and fusion.   MEDICAL HISTORY:  Remarkable for type 2 diabetes.  He takes glipizide 5  mg twice a day, Feldene 20 mg a day, and Lorcet 10/500 four a day.  He  has no allergies.   SURGICAL HISTORY:  None.   SOCIAL HISTORY:  Does not smoke, does not drink, and is on disability.   FAMILY HISTORY:  History of diabetes, hypertension, and heart disease.   REVIEW OF SYSTEMS:  Remarkable for glasses, swelling in his hands and  feet, leg pain, leg weakness, back pain, joint pain, arthritis, neck  pain.   HEENT:  Within normal limits.  He has reasonable range of motion of his  neck.  CHEST:  Clear.  CARDIAC:  Regular rate and rhythm.  ABDOMEN:  Large but nontender with no hepatosplenomegaly.  EXTREMITIES:  Without clubbing or cyanosis.  GENITOURINARY:  Deferred.  PERIPHERAL PULSES:  Good.  NEUROLOGIC:  He is awake, alert and oriented.  His cranial nerves are  intact.  Motor exam is shows 5/5 strength throughout the upper and lower  extremities.  Pulling bothers his back, not so much pushing.  He is  areflexic in the lower extremities, has positive straight leg and  reverse straight leg  raise for right leg pain.   MR demonstrates fibrous changes at the old L3 fracture.  He has an L2  inferior endplate fracture and lateral recess narrowing on the right a 2-  3 and 3-4.   CLINICAL IMPRESSION:  Right L3 radiculopathy related to fracture and  spondylosis.   The plan is for wide lateral decompression on the right side of L2-3, L3-  4; make sure the 2-3-4 roots are decompressed; place pedicle screws at  L1 and L2, possibly at L3 and L4, and bridging the fracture, the left  side with endplates, laminar bone graft as well as intertransverse graft  bilaterally.  The risks and benefits of this approach have been  discussed with him and he wishes to proceed.           ______________________________  Payton Doughty, M.D.     MWR/MEDQ  D:  11/19/2006  T:  11/19/2006  Job:  [id number]

## 2010-09-12 NOTE — Discharge Summary (Signed)
[name], [name] NO.:  [id number]   MEDICAL RECORD NO.:  [id number]          PATIENT TYPE:  INP   LOCATION:  5006                         FACILITY:  [facility]   PHYSICIAN:  Claude Manges. Whitfield, M.D.DATE OF BIRTH:  25-Apr-1956   DATE OF ADMISSION:  08/09/2007  DATE OF DISCHARGE:  08/12/2007                               DISCHARGE SUMMARY   ADMISSION DIAGNOSIS:  Osteoarthritis of left hip.   DISCHARGE DIAGNOSES:  1. Osteoarthritis of left hip.  2. Osteoarthritis of right hip.  3. Non-insulin-dependent diabetes mellitus.  4. Hypertension.  5. Post hemorrhagic anemia.   PROCEDURE:  Left total hip arthroplasty.   HISTORY:  This is a 55 year old white male status post laminectomy with  instrumentation by Dr. Channing Mutters on July 2008.  However, his left hip has been  so painful that he must waddle with persistent groin pain.  He is now  having left knee pain also.  He has difficulty with sleep and activities  of daily living.  Has radiographic end-stage osteoarthritis.  Indicated  for left total hip arthroplasty.   HOSPITAL COURSE:  This is a 55 year old male, now admitted on August 09, 2007, after appropriate laboratory studies were obtained as well as 2 g  of Ancef IV on-call in the operating room.  He was taken to the  operating room, where he underwent a left total hip arthroplasty.  He  tolerated the procedure well.  He was placed with glycemic control of  moderate correction.  Ancef was continued at 1 g IV q.6 h. for 3 doses.  Lovenox 30 mg subcu q.12 h. was started on August 10, 2007, at 8:00 a.m.  Coumadin was then started.  Consults PT/OT and Care Management were  made.  Ambulation with 50% weightbearing.  Foley was placed  intraoperatively.  He was allowed out of bed to chair the following day.  He was weaned off his PCA to oral pain medicines.  Weaned off O2,  keeping his sats greater than 92%.  His Lovenox was discontinued on  August 11, 2007.  CMP, CBC, and  ammonia level was ordered on August 11, 2007.  His Neurontin and Lortab was discontinued.  Remainder of his  hospital course was uneventful.  He was discharged on August 12, 2007, to  return back to the office in followup.  His lisinopril and  hydrochlorothiazide were held on August 12, 2007.  Radiographic studies  on August 09, 2007, showed a right hip replacement without complicating  features.  A second view of that was on the pelvis.  There is a mistype  on the report.  It was a left hip replacement with no complicating  feature.   LABORATORY STUDIES:  Hemoglobin of 12.9, hematocrit 37.5%, white count  9900, and platelets 171,000.  Discharge hemoglobin 8.9, hematocrit  25.6%, white count 8100, and platelets 151,000.  Protime rated at 14.0  and INR of 1.1, and PTT of 30.  Discharge protime 22.9 and INR 2.0.  Preop sodium 138, potassium 4.7, chloride 104, CO2 of 27, glucose 68,  BUN 18, and creatinine 1.08.  Discharge sodium 137, potassium 3.6,  chloride 100, CO2 of 28, glucose 110, BUN 11, and creatinine 0.79.  GFR  preop was greater than 60.  Discharge greater than 60.  Preop total  protein 6.2, albumin 3.6, AST 14, ALT 10, ALP 77, and total bilirubin  0.7.  Glycosylated hemoglobin was 5.8.  Urinalysis showed amber with  small amount of bilirubin, ketones 15.  Blood type O positive.  Antibody  screen negative.  No growth on urine culture.  Ammonia level was normal  at 23.   DISCHARGE INSTRUCTIONS:  Follow diabetic diet.  Follow the wound  instruction sheet and change the dressing daily.  Increase activity  slowly.  May shower on Saturday.  No lifting or driving for 6 weeks.  A  50% weightbearing as taught with physical therapy to the left leg.  Follow the precautions as taught.  Prescription for Coumadin 5 mg as  directed by home health pharmacist.  Robaxin 500 mg 1 tablet every 6  hours as needed for spasms.  Percocet 5/325 one to two tablets every 4  hours as needed for pain, Colace  100 mg b.i.d.  [name] for home health.  Follow back up with Dr. Cleophas Dunker on August 22, 2007.  Discharged in  improved condition.      [name], P.A.-C.      Claude Manges. Cleophas Dunker, M.D.  Electronically Signed    BDP/MEDQ  D:  09/20/2007  T:  09/21/2007  Job:  [id number]

## 2010-09-12 NOTE — Discharge Summary (Signed)
[name], [name] NO.:  [id number]   MEDICAL RECORD NO.:  [id number]          PATIENT TYPE:  INP   LOCATION:  5002                         FACILITY:  [facility]   PHYSICIAN:  [name], M.D.DATE OF BIRTH:  21-May-1955   DATE OF ADMISSION:  07/03/2008  DATE OF DISCHARGE:  07/07/2008                               DISCHARGE SUMMARY   ADMISSION DIAGNOSIS:  Osteoarthritis of the right hip.   DISCHARGE DIAGNOSES:  1. Osteoarthritis of the right hip.  2. Status post left total hip arthroplasty.  3. Non-insulin-dependent diabetes mellitus.  4. History of hypertension.  5. Hypoosmolality.  6. Acute blood loss anemia.   PROCEDURE:  Right total hip arthroplasty.   HISTORY:  [name] is a 55 year old white male with status post left  total hip arthroplasty last year with excellent results.  He is now  presenting with right hip pain which is worsening.  He is having severe  aching, sharp, constant pain of the right hip and groin which interferes  with ambulation and ADLs.  He has failed conservative treatment.  He is  now indicated for right total hip arthroplasty.   HOSPITAL COURSE:  A 55 year old male admitted on July 03, 2008.  After  appropriate laboratory studies were obtained as well as 1 g of Ancef IV  on-call to the operating room, he was taken to the operating room where  he underwent a right total hip arthroplasty.  He tolerated the procedure  well.  He was placed on Dilaudid full-dose PCA pump.  Started on Lovenox  40 mg subcu q.24 h. and was placed on Coumadin as per pharmacy protocol.  Ancef was continued to 1 g IV q.6 h. x3 doses.  Placed on 60 g carb  modified diet.   CONSULTATION:  PT, OT, and care management were made.  Ambulate partial  weightbearing 50%.  He was allowed out of bed to chair the following  day.  He was weaned off his PCA and off the O2.  He was fluid restricted  to 1000 mL per 24 hours secondary to his hypoosmolality and  decreased  electrolytes.  His lisinopril and hydrochlorothiazide were also held.  On July 05, 2008, his Lovenox was discontinued.  On July 05, 2008, he  did have a Doppler of his right lower leg to rule out DVT, which was  negative.  Remainder of his hospital course was uneventful and he was  discharged on July 07, 2008 to return back to the office in 10-14 days  for staple removal.  His non-invasive vascular lab revealed no DVT,  superficial thrombosis, or Baker cyst of the right leg.  Portable pelvis  on July 03, 2008 revealed satisfactory position of right hip  replacement.  Right hip on July 03, 2008 revealed femoral component of  the right hip prosthesis.  He was in satisfactory position on this cross-  table lateral view.   LABORATORY STUDIES:  Hemoglobin 13.6, hematocrit 40.0%, white count  9600, platelets was 177,000.  Discharge hemoglobin 9.6, hematocrit  28.8%, white count 6800, platelets 182,000.  Protime preop 13.8, INR  1.0, PTT 35.  Discharge protime 26.6, INR 2.3.  Electrolytes preop;  sodium 138, potassium 4.0, chloride 102, CO2 27, glucose 140, BUN 14,  creatinine 0.11.  His sodium became low at 130 on July 04, 2008.  With  fluid restriction and discontinuation of medications, his sodium at time  of discharge was 136, potassium 4.0, chloride 100, CO2 29, glucose 98,  BUN 15, creatinine 0.77.  GFR was greater than 60 throughout his  hospital course.  Preop calcium 9.5, calcium at discharge 8.3.  Preop  total protein 6.8, albumin 3.6, AST 12, ALT 9, ALP 90, total bilirubin  0.7, serum osmolality on July 04, 2008 was 269 and on July 06, 2008  was 275.  Glycosylated hemoglobin was 6.4.  Urinalysis on June 28, 2008  revealed hazy with a small amount of bilirubin, ketones of 15, small  amount of leukocyte esterase with few epithelials with 3-6 whites, 0-2  reds, and rare bacteria.  Urine on July 03, 2008 revealed rare bacteria,  0-2 whites and hyaline casts and mucus  present.  Urine cultures on June 28, 2008 and July 03, 2008 revealed no growth.   DISCHARGE INSTRUCTIONS:  He was discharged on diabetic diet.  He will  increase activity slowly.  Use his crutches or walker, partial  weightbearing 50%.  No driving or lifting for 6 weeks.  May shower on  Saturday.  Keep the incision clean and dry and change his dressings  daily with 4 x 4 gauze and tape.  Prescription for Percocet 5/325 1-2  tablets every 4 hours as needed for pain, Robaxin 500 mg 1 tablet every  6 hours as needed for spasms, Coumadin 5 mg as directed by home health.  He was discharged in improved condition.  He will need to follow back up  in the office with [name] on July 16, 2008 and he will need to  make an appointment for that.      [name], P.A.-C.      [name], M.D.  Electronically Signed    BDP/MEDQ  D:  08/16/2008  T:  08/17/2008  Job:  [id number]

## 2011-01-20 LAB — PROTIME-INR
INR: 1.1
INR: 1.3
INR: 2 — ABNORMAL HIGH
Prothrombin Time: 16.2 — ABNORMAL HIGH
Prothrombin Time: 22.9 — ABNORMAL HIGH

## 2011-01-20 LAB — BASIC METABOLIC PANEL
Calcium: 8.4
Calcium: 8.4
Creatinine, Ser: 0.87
GFR calc Af Amer: >60
GFR calc non Af Amer: >60
GFR calc non Af Amer: >60
Potassium: 3.8
Sodium: 135

## 2011-01-20 LAB — CBC
HCT: 37.5 — ABNORMAL LOW
HCT: 26.3 — ABNORMAL LOW
Hemoglobin: 12.9 — ABNORMAL LOW
Hemoglobin: 9 — ABNORMAL LOW
MCHC: 34.5
MCV: 87.3
Platelets: 136 — ABNORMAL LOW
RBC: 2.93 — ABNORMAL LOW
RBC: 3.23 — ABNORMAL LOW
RDW: 14.2
WBC: 6.8
WBC: 8.1
WBC: 8.5

## 2011-01-20 LAB — APTT: aPTT: 30

## 2011-01-20 LAB — HEMOGLOBIN A1C
Hgb A1c MFr Bld: 5.8
Mean Plasma Glucose: 129

## 2011-01-20 LAB — DIFFERENTIAL
Basophils Absolute: 0
Basophils Relative: 1
Basophils Relative: 0
Eosinophils Absolute: 0.1
Eosinophils Relative: 1
Lymphocytes Relative: 14
Lymphs Abs: 1.3
Monocytes Absolute: 1
Monocytes Relative: 11
Neutro Abs: 7.2
Neutrophils Relative %: 73

## 2011-01-20 LAB — URINALYSIS, ROUTINE W REFLEX MICROSCOPIC
Glucose, UA: NEGATIVE
Hgb urine dipstick: NEGATIVE
Ketones, ur: 15 — AB
Protein, ur: NEGATIVE

## 2011-01-20 LAB — COMPREHENSIVE METABOLIC PANEL
ALT: 12
AST: 13
Albumin: 2.4 — ABNORMAL LOW
Alkaline Phosphatase: 77
Alkaline Phosphatase: 68
BUN: 18
CO2: 28
Calcium: 9.1
Chloride: 100
Creatinine, Ser: 1.08
Creatinine, Ser: 0.79
GFR calc Af Amer: >60
GFR calc non Af Amer: >60
Glucose, Bld: 68 — ABNORMAL LOW
Potassium: 3.6
Total Bilirubin: 0.7
Total Protein: 6.2

## 2011-01-20 LAB — TYPE AND SCREEN

## 2011-01-20 LAB — URINE CULTURE: Culture: NO GROWTH

## 2011-01-20 LAB — AMMONIA: Ammonia: 23

## 2011-02-09 LAB — URINE MICROSCOPIC-ADD ON

## 2011-02-09 LAB — COMPREHENSIVE METABOLIC PANEL
ALT: 9
AST: 14
Albumin: 3.6
Alkaline Phosphatase: 66
Calcium: 9.3
GFR calc Af Amer: >60
Potassium: 4.1
Sodium: 139
Total Protein: 6.8

## 2011-02-09 LAB — URINALYSIS, ROUTINE W REFLEX MICROSCOPIC
Hgb urine dipstick: NEGATIVE
Nitrite: NEGATIVE
Specific Gravity, Urine: 1.03
Urobilinogen, UA: 1
pH: 5.5

## 2011-02-09 LAB — TYPE AND SCREEN: ABO/RH(D): O POS

## 2011-02-09 LAB — DIFFERENTIAL
Basophils Relative: 1
Eosinophils Absolute: 0.3
Lymphs Abs: 2.5
Monocytes Absolute: 0.9 — ABNORMAL HIGH
Monocytes Relative: 11

## 2011-02-09 LAB — ABO/RH: ABO/RH(D): O POS

## 2011-02-09 LAB — CBC
Hemoglobin: 14
Platelets: 245
RDW: 14.2 — ABNORMAL HIGH

## 2016-11-23 DIAGNOSIS — Z7984 Long term (current) use of oral hypoglycemic drugs: Secondary | ICD-10-CM | POA: Diagnosis not present

## 2016-11-23 DIAGNOSIS — M79671 Pain in right foot: Secondary | ICD-10-CM | POA: Diagnosis not present

## 2016-11-23 DIAGNOSIS — E1142 Type 2 diabetes mellitus with diabetic polyneuropathy: Secondary | ICD-10-CM | POA: Diagnosis not present

## 2016-11-30 DIAGNOSIS — H524 Presbyopia: Secondary | ICD-10-CM | POA: Diagnosis not present

## 2016-11-30 DIAGNOSIS — H43812 Vitreous degeneration, left eye: Secondary | ICD-10-CM | POA: Diagnosis not present

## 2016-11-30 DIAGNOSIS — E119 Type 2 diabetes mellitus without complications: Secondary | ICD-10-CM | POA: Diagnosis not present

## 2016-12-01 DIAGNOSIS — E119 Type 2 diabetes mellitus without complications: Secondary | ICD-10-CM | POA: Diagnosis not present

## 2016-12-01 DIAGNOSIS — E1140 Type 2 diabetes mellitus with diabetic neuropathy, unspecified: Secondary | ICD-10-CM | POA: Diagnosis not present

## 2017-07-08 DIAGNOSIS — E1142 Type 2 diabetes mellitus with diabetic polyneuropathy: Secondary | ICD-10-CM | POA: Diagnosis not present

## 2017-07-08 DIAGNOSIS — E119 Type 2 diabetes mellitus without complications: Secondary | ICD-10-CM | POA: Diagnosis not present

## 2017-07-08 DIAGNOSIS — Z79899 Other long term (current) drug therapy: Secondary | ICD-10-CM | POA: Diagnosis not present

## 2018-04-29 ENCOUNTER — Encounter: Payer: Self-pay | Admitting: Family Medicine

## 2018-04-29 ENCOUNTER — Ambulatory Visit (INDEPENDENT_AMBULATORY_CARE_PROVIDER_SITE_OTHER): Payer: Medicare Other | Admitting: Family Medicine

## 2018-04-29 VITALS — BP 123/83 | HR 84 | Temp 97.4°F | Ht 75.0 in | Wt 265.4 lb

## 2018-04-29 DIAGNOSIS — E1149 Type 2 diabetes mellitus with other diabetic neurological complication: Secondary | ICD-10-CM | POA: Insufficient documentation

## 2018-04-29 DIAGNOSIS — Z13220 Encounter for screening for lipoid disorders: Secondary | ICD-10-CM

## 2018-04-29 LAB — BAYER DCA HB A1C WAIVED

## 2018-04-29 MED ORDER — GLIPIZIDE ER 5 MG PO TB24: 5.0000 mg | ORAL_TABLET | Freq: Two times a day (BID) | ORAL | 3 refills | Status: DC

## 2018-04-29 MED ORDER — GABAPENTIN 300 MG PO CAPS: 300.0000 mg | ORAL_CAPSULE | Freq: Three times a day (TID) | ORAL | 3 refills | Status: DC

## 2018-04-29 MED ORDER — METFORMIN HCL ER 500 MG PO TB24: 500.0000 mg | ORAL_TABLET | Freq: Two times a day (BID) | ORAL | 3 refills | Status: DC

## 2018-04-29 NOTE — Progress Notes (Signed)
BP 123/83   Pulse 84   Temp (!) 97.4 F (36.3 C) (Oral)   Ht '6\' 3"'$  (1.905 m)   Wt 265 lb 6.4 oz (120.4 kg)   BMI 33.17 kg/m    Subjective:    Patient ID: [name]., male    DOB: 1955/10/13, 63 y.o.   MRN: [id number]  HPI: Sakib Noguez. is a 63 y.o. male presenting on 04/29/2018 for New Patient (Initial Visit) [facility] [place]) and Establish Care   HPI Type 2 diabetes mellitus Patient is coming in as a new patient to establish care with our office.  He says is been out of his diabetes medications for couple weeks now and his sugars have been running as high as 380.  Patient comes in today for recheck of his diabetes. Patient has been currently taking metformin and glipizide but has been out of them for 2 weeks. Patient is not currently on an ACE inhibitor/ARB. Patient has seen an ophthalmologist this year. Patient has diabetic neuropathy and takes gabapentin but is flared up because sugars are elevated currently   Relevant past medical, surgical, family and social history reviewed and updated as indicated. Interim medical history since our last visit reviewed. Allergies and medications reviewed and updated.  Review of Systems  Constitutional: Negative for chills and fever.  HENT: Negative for ear pain and tinnitus.   Eyes: Negative for pain and visual disturbance.  Respiratory: Negative for cough, shortness of breath and wheezing.   Cardiovascular: Negative for chest pain, palpitations and leg swelling.  Gastrointestinal: Negative for abdominal pain, blood in stool, constipation and diarrhea.  Genitourinary: Negative for dysuria and hematuria.  Musculoskeletal: Negative for back pain, gait problem and myalgias.  Skin: Negative for rash.  Neurological: Positive for numbness. Negative for dizziness, weakness and headaches.  Psychiatric/Behavioral: Negative for suicidal ideas.  All other systems reviewed and are negative.   Per HPI unless specifically indicated  above  Social History   Socioeconomic History  . Marital status: Married    Spouse name: Not on file  . Number of children: Not on file  . Years of education: Not on file  . Highest education level: Not on file  Occupational History  . Not on file  Social Needs  . Financial resource strain: Not on file  . Food insecurity:    Worry: Not on file    Inability: Not on file  . Transportation needs:    Medical: Not on file    Non-medical: Not on file  Tobacco Use  . Smoking status: Never Smoker  . Smokeless tobacco: Never Used  Substance and Sexual Activity  . Alcohol use: Never    Frequency: Never  . Drug use: Never  . Sexual activity: Yes    Comment: married for 2 years, widowed twice, 10 children  Lifestyle  . Physical activity:    Days per week: Not on file    Minutes per session: Not on file  . Stress: Not on file  Relationships  . Social connections:    Talks on phone: Not on file    Gets together: Not on file    Attends religious service: Not on file    Active member of club or organization: Not on file    Attends meetings of clubs or organizations: Not on file    Relationship status: Not on file  . Intimate partner violence:    Fear of current or ex partner: Not on file    Emotionally  abused: Not on file    Physically abused: Not on file    Forced sexual activity: Not on file  Other Topics Concern  . Not on file  Social History Narrative  . Not on file    Past Surgical History:  Procedure Laterality Date  . ANKLE SURGERY Right   . BACK SURGERY    . EYE SURGERY     cateracts  . GASTROPLASTY  1978  . TOTAL HIP ARTHROPLASTY Bilateral     Family History  Problem Relation Age of Onset  . Diabetes Mother   . Heart disease Mother   . Heart disease Father   . Diabetes Father     Allergies as of 04/29/2018      Reactions   Bee Venom       Medication List       Accurate as of April 29, 2018 10:08 AM. Always use your most recent med list.         gabapentin 300 MG capsule Commonly known as:  NEURONTIN Take 1 capsule (300 mg total) by mouth 3 (three) times daily.   glipiZIDE 5 MG 24 hr tablet Commonly known as:  GLUCOTROL XL Take 1 tablet (5 mg total) by mouth 2 (two) times daily.   metFORMIN 500 MG 24 hr tablet Commonly known as:  GLUCOPHAGE-XR Take 1 tablet (500 mg total) by mouth 2 (two) times daily.          Objective:    BP 123/83   Pulse 84   Temp (!) 97.4 F (36.3 C) (Oral)   Ht '6\' 3"'$  (1.905 m)   Wt 265 lb 6.4 oz (120.4 kg)   BMI 33.17 kg/m   Wt Readings from Last 3 Encounters:  04/29/18 265 lb 6.4 oz (120.4 kg)    Physical Exam Vitals signs and nursing note reviewed.  Constitutional:      General: He is not in acute distress.    Appearance: He is well-developed. He is not diaphoretic.  Eyes:     General: No scleral icterus.    Conjunctiva/sclera: Conjunctivae normal.  Neck:     Musculoskeletal: Neck supple.     Thyroid: No thyromegaly.  Cardiovascular:     Rate and Rhythm: Normal rate and regular rhythm.     Heart sounds: Normal heart sounds. No murmur.  Pulmonary:     Effort: Pulmonary effort is normal. No respiratory distress.     Breath sounds: Normal breath sounds. No wheezing.  Musculoskeletal: Normal range of motion.  Lymphadenopathy:     Cervical: No cervical adenopathy.  Skin:    General: Skin is warm and dry.     Findings: No rash.  Neurological:     Mental Status: He is alert and oriented to person, place, and time.     Coordination: Coordination normal.  Psychiatric:        Behavior: Behavior normal.         Assessment & Plan:   Problem List Items Addressed This Visit      Endocrine   Type 2 diabetes mellitus with neurological complications ([place]) - Primary   Relevant Medications   glipiZIDE (GLUCOTROL XL) 5 MG 24 hr tablet   metFORMIN (GLUCOPHAGE-XR) 500 MG 24 hr tablet   gabapentin (NEURONTIN) 300 MG capsule   Other Relevant Orders   Bayer DCA Hb A1c Waived   CBC  with Differential/Platelet   CMP14+EGFR   Lipid panel    Other Visit Diagnoses    Lipid screening  Relevant Orders   CBC with Differential/Platelet   Lipid panel      Will have patient restart on his previous home medications including metformin and glipizide and gabapentin and will check his blood work today and will reevaluate in 3 months Follow up plan: Return in about 3 months (around 07/29/2018), or if symptoms worsen or fail to improve, for Diabetes recheck.  Caryl Pina, MD [facility] 04/29/2018, 10:08 AM

## 2018-05-02 LAB — CMP14+EGFR
ALT: 11 IU/L (ref 0–44)
AST: 14 IU/L (ref 0–40)
Albumin/Globulin Ratio: 1.3 (ref 1.2–2.2)
Albumin: 3.9 g/dL (ref 3.6–4.8)
Alkaline Phosphatase: 135 IU/L — ABNORMAL HIGH (ref 39–117)
BUN/Creatinine Ratio: 11 (ref 10–24)
BUN: 11 mg/dL (ref 8–27)
Bilirubin Total: 0.5 mg/dL (ref 0.0–1.2)
CALCIUM: 9.4 mg/dL (ref 8.6–10.2)
CHLORIDE: 94 mmol/L — AB (ref 96–106)
CO2: 20 mmol/L (ref 20–29)
Creatinine, Ser: 0.99 mg/dL (ref 0.76–1.27)
GFR, EST AFRICAN AMERICAN: 93 mL/min/{1.73_m2} (ref >59)
GFR, EST NON AFRICAN AMERICAN: 81 mL/min/{1.73_m2} (ref >59)
GLUCOSE: 481 mg/dL — AB (ref 65–99)
Globulin, Total: 3 g/dL (ref 1.5–4.5)
Potassium: 4.5 mmol/L (ref 3.5–5.2)
Sodium: 131 mmol/L — ABNORMAL LOW (ref 134–144)
TOTAL PROTEIN: 6.9 g/dL (ref 6.0–8.5)

## 2018-05-02 LAB — CBC WITH DIFFERENTIAL/PLATELET
BASOS ABS: 0.1 10*3/uL (ref 0.0–0.2)
Basos: 1 %
EOS (ABSOLUTE): 0.2 10*3/uL (ref 0.0–0.4)
Eos: 2 %
Hematocrit: 45.4 % (ref 37.5–51.0)
Hemoglobin: 15.2 g/dL (ref 13.0–17.7)
IMMATURE GRANS (ABS): 0.1 10*3/uL (ref 0.0–0.1)
IMMATURE GRANULOCYTES: 1 %
LYMPHS: 18 %
Lymphocytes Absolute: 1.7 10*3/uL (ref 0.7–3.1)
MCH: 27.6 pg (ref 26.6–33.0)
MCHC: 33.5 g/dL (ref 31.5–35.7)
MCV: 83 fL (ref 79–97)
Monocytes Absolute: 0.7 10*3/uL (ref 0.1–0.9)
Monocytes: 7 %
Neutrophils Absolute: 6.7 10*3/uL (ref 1.4–7.0)
Neutrophils: 71 %
PLATELETS: 216 10*3/uL (ref 150–450)
RBC: 5.5 x10E6/uL (ref 4.14–5.80)
RDW: 13.9 % (ref 12.3–15.4)
WBC: 9.5 10*3/uL (ref 3.4–10.8)

## 2018-05-02 LAB — LIPID PANEL
Chol/HDL Ratio: 3.1 ratio (ref 0.0–5.0)
Cholesterol, Total: 160 mg/dL (ref 100–199)
HDL: 52 mg/dL (ref >39)
LDL Calculated: 86 mg/dL (ref 0–99)
Triglycerides: 108 mg/dL (ref 0–149)
VLDL Cholesterol Cal: 22 mg/dL (ref 5–40)

## 2018-07-29 ENCOUNTER — Ambulatory Visit: Payer: Medicare Other | Admitting: Family Medicine

## 2018-08-01 ENCOUNTER — Ambulatory Visit: Payer: Medicare Other | Admitting: Family Medicine

## 2018-08-04 ENCOUNTER — Ambulatory Visit: Payer: Medicare Other | Admitting: Family Medicine

## 2018-08-17 ENCOUNTER — Other Ambulatory Visit: Payer: Self-pay | Admitting: Family Medicine

## 2018-08-17 ENCOUNTER — Other Ambulatory Visit: Payer: Self-pay

## 2018-08-17 NOTE — Telephone Encounter (Signed)
Scheduled patient for follow up appointment with Dr. Warrick Parisian 08/18/2018.  Advised patient Dr. Warrick Parisian will address refills at that time. Patient agreeable.

## 2018-08-18 ENCOUNTER — Encounter: Payer: Self-pay | Admitting: Family Medicine

## 2018-08-18 ENCOUNTER — Other Ambulatory Visit: Payer: Self-pay

## 2018-08-18 ENCOUNTER — Ambulatory Visit (INDEPENDENT_AMBULATORY_CARE_PROVIDER_SITE_OTHER): Payer: Medicare Other | Admitting: Family Medicine

## 2018-08-18 VITALS — BP 149/90 | HR 67 | Temp 97.4°F | Ht 75.0 in | Wt 279.0 lb

## 2018-08-18 DIAGNOSIS — I10 Essential (primary) hypertension: Secondary | ICD-10-CM

## 2018-08-18 DIAGNOSIS — E1159 Type 2 diabetes mellitus with other circulatory complications: Secondary | ICD-10-CM | POA: Diagnosis not present

## 2018-08-18 DIAGNOSIS — E1149 Type 2 diabetes mellitus with other diabetic neurological complication: Secondary | ICD-10-CM | POA: Diagnosis not present

## 2018-08-18 LAB — BAYER DCA HB A1C WAIVED: HB A1C (BAYER DCA - WAIVED): 10.1 % — ABNORMAL HIGH (ref <7.0)

## 2018-08-18 MED ORDER — GABAPENTIN 400 MG PO CAPS: 400.0000 mg | ORAL_CAPSULE | Freq: Three times a day (TID) | ORAL | 5 refills | Status: DC

## 2018-08-18 MED ORDER — LISINOPRIL 20 MG PO TABS: 20.0000 mg | ORAL_TABLET | Freq: Every day | ORAL | 5 refills | Status: DC

## 2018-08-18 MED ORDER — METFORMIN HCL ER 500 MG PO TB24
500.0000 mg | ORAL_TABLET | Freq: Two times a day (BID) | ORAL | 5 refills | Status: DC
Start: 2018-08-18 — End: 2018-11-21

## 2018-08-18 MED ORDER — GLIPIZIDE ER 5 MG PO TB24: 5.0000 mg | ORAL_TABLET | Freq: Two times a day (BID) | ORAL | 5 refills | Status: DC

## 2018-08-18 NOTE — Progress Notes (Signed)
BP (!) 149/90   Pulse 67   Temp (!) 97.4 F (36.3 C) (Oral)   Ht '6\' 3"'$  (1.905 m)   Wt 279 lb (126.6 kg)   BMI 34.87 kg/m    Subjective:   Patient ID: [name]., male    DOB: 1955-08-07, 63 y.o.   MRN: [id number]  HPI: [name]. is a 63 y.o. male presenting on 08/18/2018 for Diabetes (3 month follow up)   HPI Type 2 diabetes mellitus Patient comes in today for recheck of his diabetes. Patient has been currently taking metformin and glipizide and says his blood sugars are running better and is staying under 200 more often. Patient is not currently on an ACE inhibitor/ARB. Patient has seen an ophthalmologist this year.  Patient says that his neuropathy has increased in both of his feet and he is having more burning and numbness.  He says his right heel is numb and his left toes are numb.  He denies any sores.  Hypertension Patient is currently on no medication, and their blood pressure today is 149/90. Patient denies headaches, blurred vision, chest pains, shortness of breath, or weakness. Denies any side effects from medication and is content with current medication.   Relevant past medical, surgical, family and social history reviewed and updated as indicated. Interim medical history since our last visit reviewed. Allergies and medications reviewed and updated.  Review of Systems  Constitutional: Negative for chills and fever.  Eyes: Negative for visual disturbance.  Respiratory: Negative for shortness of breath and wheezing.   Cardiovascular: Negative for chest pain and leg swelling.  Endocrine: Positive for polyuria. Negative for polydipsia.  Musculoskeletal: Negative for back pain and gait problem.  Skin: Negative for rash.  Neurological: Negative for weakness, numbness and headaches.  All other systems reviewed and are negative.   Per HPI unless specifically indicated above   Allergies as of 08/18/2018      Reactions   Bee Venom       Medication  List       Accurate as of August 18, 2018  9:59 AM. Always use your most recent med list.        gabapentin 400 MG capsule Commonly known as:  NEURONTIN Take 1 capsule (400 mg total) by mouth 3 (three) times daily.   glipiZIDE 5 MG 24 hr tablet Commonly known as:  GLUCOTROL XL Take 1 tablet (5 mg total) by mouth 2 (two) times daily.   lisinopril 20 MG tablet Commonly known as:  ZESTRIL Take 1 tablet (20 mg total) by mouth daily.   metFORMIN 500 MG 24 hr tablet Commonly known as:  GLUCOPHAGE-XR Take 1 tablet (500 mg total) by mouth 2 (two) times daily.        Objective:   BP (!) 149/90   Pulse 67   Temp (!) 97.4 F (36.3 C) (Oral)   Ht '6\' 3"'$  (1.905 m)   Wt 279 lb (126.6 kg)   BMI 34.87 kg/m   Wt Readings from Last 3 Encounters:  08/18/18 279 lb (126.6 kg)  04/29/18 265 lb 6.4 oz (120.4 kg)    Physical Exam Vitals signs and nursing note reviewed.  Constitutional:      General: He is not in acute distress.    Appearance: He is well-developed. He is not diaphoretic.  Eyes:     General: No scleral icterus.    Conjunctiva/sclera: Conjunctivae normal.  Neck:     Musculoskeletal: Neck supple.  Thyroid: No thyromegaly.  Cardiovascular:     Rate and Rhythm: Normal rate and regular rhythm.     Heart sounds: Normal heart sounds. No murmur.  Pulmonary:     Effort: Pulmonary effort is normal. No respiratory distress.     Breath sounds: Normal breath sounds. No wheezing.  Musculoskeletal: Normal range of motion.  Lymphadenopathy:     Cervical: No cervical adenopathy.  Skin:    General: Skin is warm and dry.     Findings: No rash.  Neurological:     Mental Status: He is alert and oriented to person, place, and time.     Coordination: Coordination normal.  Psychiatric:        Behavior: Behavior normal.    Diabetic Foot Exam - Simple   Simple Foot Form Diabetic Foot exam was performed with the following findings:  Yes 08/18/2018 10:02 AM  Visual Inspection No  deformities, no ulcerations, no other skin breakdown bilaterally:  Yes Sensation Testing See comments:  Yes Pulse Check Posterior Tibialis and Dorsalis pulse intact bilaterally:  Yes Comments Patient has some decreased sensation on his right heel and left toes especially around the big toe.       Assessment & Plan:   Problem List Items Addressed This Visit      Endocrine   Type 2 diabetes mellitus with neurological complications ([place]) - Primary   Relevant Medications   gabapentin (NEURONTIN) 400 MG capsule   glipiZIDE (GLUCOTROL XL) 5 MG 24 hr tablet   metFORMIN (GLUCOPHAGE-XR) 500 MG 24 hr tablet   lisinopril (ZESTRIL) 20 MG tablet   Other Relevant Orders   Bayer DCA Hb A1c Waived   Microalbumin / creatinine urine ratio   BMP8+EGFR    Other Visit Diagnoses    Hypertension associated with diabetes (HCC)       Relevant Medications   glipiZIDE (GLUCOTROL XL) 5 MG 24 hr tablet   metFORMIN (GLUCOPHAGE-XR) 500 MG 24 hr tablet   lisinopril (ZESTRIL) 20 MG tablet   Other Relevant Orders   BMP8+EGFR      Patient's A1c is improved today and is 10.1 which is down from 14.  He is losing weight and trying to change his diet and lifestyle.  He is still taking metformin and glipizide.  Patient does not currently have a partner that he drug plan so he cannot afford any medications besides generics and he does not want to go on insulin currently and would like to try diet and lifestyle changes again for another few months.  We will start patient on lisinopril for blood pressure and have him return in 3 to 4 weeks for a basic metabolic panel Follow up plan: Return in about 3 months (around 11/17/2018), or if symptoms worsen or fail to improve, for Recheck diabetes.  Counseling provided for all of the vaccine components Orders Placed This Encounter  Procedures  . Bayer DCA Hb A1c Waived  . Microalbumin / creatinine urine ratio  . BMP8+EGFR    Caryl Pina, MD [facility] 08/18/2018, 9:59 AM

## 2018-08-19 ENCOUNTER — Telehealth: Payer: Self-pay | Admitting: Family Medicine

## 2018-08-19 DIAGNOSIS — K047 Periapical abscess without sinus: Secondary | ICD-10-CM

## 2018-08-19 LAB — MICROALBUMIN / CREATININE URINE RATIO
Creatinine, Urine: 112.2 mg/dL
Microalb/Creat Ratio: 4 mg/g creat (ref 0–29)
Microalbumin, Urine: 4.8 ug/mL

## 2018-08-19 MED ORDER — IBUPROFEN 600 MG PO TABS: 600.0000 mg | ORAL_TABLET | Freq: Three times a day (TID) | ORAL | 0 refills | Status: DC | PRN

## 2018-08-19 MED ORDER — AMOXICILLIN-POT CLAVULANATE 875-125 MG PO TABS: 1.0000 | ORAL_TABLET | Freq: Two times a day (BID) | ORAL | 0 refills | Status: DC

## 2018-08-19 NOTE — Telephone Encounter (Signed)
Pt aware - cancelled at [facility] and was sent to [facility].

## 2018-08-19 NOTE — Telephone Encounter (Signed)
Please let them know that I sent in some ibuprofen and an antibiotic for him, he can also let him know that if it is an emergent situation with the tooth there is a dentist that I know of that is open in [place], Dr. Sena Hitch wooden and is taking emergent visits Caryl Pina, MD [facility] 08/19/2018, 11:17 AM

## 2018-11-18 ENCOUNTER — Telehealth: Payer: Self-pay | Admitting: Family Medicine

## 2018-11-18 NOTE — Telephone Encounter (Signed)
FYI

## 2018-11-18 NOTE — Telephone Encounter (Signed)
Okay sounds good thanks for the info

## 2018-11-21 ENCOUNTER — Ambulatory Visit (INDEPENDENT_AMBULATORY_CARE_PROVIDER_SITE_OTHER): Payer: Medicare Other | Admitting: Family Medicine

## 2018-11-21 ENCOUNTER — Encounter: Payer: Self-pay | Admitting: Family Medicine

## 2018-11-21 VITALS — BP 125/80 | HR 68 | Temp 97.8°F | Ht 75.0 in | Wt 287.2 lb

## 2018-11-21 DIAGNOSIS — E1149 Type 2 diabetes mellitus with other diabetic neurological complication: Secondary | ICD-10-CM

## 2018-11-21 DIAGNOSIS — I10 Essential (primary) hypertension: Secondary | ICD-10-CM | POA: Diagnosis not present

## 2018-11-21 DIAGNOSIS — Z9189 Other specified personal risk factors, not elsewhere classified: Secondary | ICD-10-CM

## 2018-11-21 DIAGNOSIS — E1159 Type 2 diabetes mellitus with other circulatory complications: Secondary | ICD-10-CM

## 2018-11-21 LAB — BAYER DCA HB A1C WAIVED: HB A1C (BAYER DCA - WAIVED): >14 % — ABNORMAL HIGH (ref <7.0)

## 2018-11-21 MED ORDER — OLMESARTAN MEDOXOMIL 20 MG PO TABS: 20.0000 mg | ORAL_TABLET | Freq: Every day | ORAL | 3 refills | Status: DC

## 2018-11-21 MED ORDER — ATORVASTATIN CALCIUM 20 MG PO TABS: 20.0000 mg | ORAL_TABLET | Freq: Every day | ORAL | 3 refills | Status: DC

## 2018-11-21 MED ORDER — METFORMIN HCL ER 500 MG PO TB24: 500.0000 mg | ORAL_TABLET | Freq: Two times a day (BID) | ORAL | 3 refills | Status: DC

## 2018-11-21 MED ORDER — GABAPENTIN 400 MG PO CAPS: 800.0000 mg | ORAL_CAPSULE | Freq: Two times a day (BID) | ORAL | 3 refills | Status: DC

## 2018-11-21 MED ORDER — GLIPIZIDE ER 5 MG PO TB24: 5.0000 mg | ORAL_TABLET | Freq: Two times a day (BID) | ORAL | 3 refills | Status: DC

## 2018-11-21 NOTE — Progress Notes (Signed)
BP 125/80   Pulse 68   Temp 97.8 F (36.6 C) (Temporal)   Ht '6\' 3"'$  (1.905 m)   Wt 287 lb 3.2 oz (130.3 kg)   BMI 35.90 kg/m    Subjective:   Patient ID: Ethan Price., male    DOB: [birth year], 63 y.o.   MRN: [id number]  HPI: Ethan Santistevan. is a 63 y.o. male presenting on 11/21/2018 for Diabetes (check up ), Hypertension (Patient states the lisinopril gives him a cough), and Knee Pain (left x 2 weeks)   HPI Type 2 diabetes mellitus Patient comes in today for recheck of his diabetes. Patient has been currently taking glipizide and metformin and says his blood sugars been running better although is up on weight and it is been challenging because of the coronavirus and being stuck at home. Patient is currently on an ACE inhibitor/ARB and has a cough with it would like to switch blood pressure pills. Patient has not seen an ophthalmologist this year. Patient denies any issues with their feet except the neuropathy, he is still fighting some neuropathy and he switched his gabapentin to where he is taking instead of 1 3 times a day to twice a day and he says that a lot better so we will change his prescription as such.   Hypertension Patient is currently on lisinopril but is having a cough and would like to switch to an R, and their blood pressure today is 125/80. Patient denies any lightheadedness or dizziness. Patient denies headaches, blurred vision, chest pains, shortness of breath, or weakness. Denies any side effects from medication and is content with current medication.   Patient has back and knee pain that bothers him off and on with the arthritis and because that he keeps a handicap form and would like to get 1 of those.  He says his knees been bothering him a little more recently and feel more stiff recently.  Relevant past medical, surgical, family and social history reviewed and updated as indicated. Interim medical history since our last visit reviewed. Allergies and  medications reviewed and updated.  Review of Systems  Constitutional: Negative for chills and fever.  Eyes: Negative for visual disturbance.  Respiratory: Negative for shortness of breath and wheezing.   Cardiovascular: Negative for chest pain and leg swelling.  Musculoskeletal: Positive for arthralgias and back pain. Negative for gait problem.  Skin: Negative for rash.  Neurological: Positive for numbness. Negative for dizziness, weakness and light-headedness.  All other systems reviewed and are negative.   Per HPI unless specifically indicated above   Allergies as of 11/21/2018      Reactions   Bee Venom       Medication List       Accurate as of November 21, 2018 10:40 AM. If you have any questions, ask your nurse or doctor.        STOP taking these medications   amoxicillin-clavulanate 875-125 MG tablet Commonly known as: AUGMENTIN Stopped by: Fransisca Kaufmann Dettinger, MD     TAKE these medications   gabapentin 400 MG capsule Commonly known as: NEURONTIN Take 1 capsule (400 mg total) by mouth 3 (three) times daily.   glipiZIDE 5 MG 24 hr tablet Commonly known as: GLUCOTROL XL Take 1 tablet (5 mg total) by mouth 2 (two) times daily.   ibuprofen 600 MG tablet Commonly known as: ADVIL Take 1 tablet (600 mg total) by mouth every 8 (eight) hours as needed.   lisinopril 20  MG tablet Commonly known as: ZESTRIL Take 1 tablet (20 mg total) by mouth daily.   metFORMIN 500 MG 24 hr tablet Commonly known as: GLUCOPHAGE-XR Take 1 tablet (500 mg total) by mouth 2 (two) times daily.        Objective:   BP 125/80   Pulse 68   Temp 97.8 F (36.6 C) (Temporal)   Ht '6\' 3"'$  (1.905 m)   Wt 287 lb 3.2 oz (130.3 kg)   BMI 35.90 kg/m   Wt Readings from Last 3 Encounters:  11/21/18 287 lb 3.2 oz (130.3 kg)  08/18/18 279 lb (126.6 kg)  04/29/18 265 lb 6.4 oz (120.4 kg)    Physical Exam Vitals signs and nursing note reviewed.  Constitutional:      General: He is not in acute  distress.    Appearance: He is well-developed. He is not diaphoretic.  Eyes:     General: No scleral icterus.    Conjunctiva/sclera: Conjunctivae normal.  Neck:     Musculoskeletal: Neck supple.     Thyroid: No thyromegaly.  Cardiovascular:     Rate and Rhythm: Normal rate and regular rhythm.     Heart sounds: Normal heart sounds. No murmur.  Pulmonary:     Effort: Pulmonary effort is normal. No respiratory distress.     Breath sounds: Normal breath sounds. No wheezing.  Musculoskeletal:        General: No swelling.  Lymphadenopathy:     Cervical: No cervical adenopathy.  Skin:    General: Skin is warm and dry.     Findings: No rash.  Neurological:     Mental Status: He is alert and oriented to person, place, and time.     Coordination: Coordination normal.  Psychiatric:        Behavior: Behavior normal.       Assessment & Plan:   Problem List Items Addressed This Visit      Cardiovascular and Mediastinum   Hypertension associated with diabetes ([place])   Relevant Medications   olmesartan (BENICAR) 20 MG tablet   glipiZIDE (GLUCOTROL XL) 5 MG 24 hr tablet   metFORMIN (GLUCOPHAGE-XR) 500 MG 24 hr tablet   atorvastatin (LIPITOR) 20 MG tablet     Endocrine   Type 2 diabetes mellitus with neurological complications ([facility]) - Primary   Relevant Medications   olmesartan (BENICAR) 20 MG tablet   gabapentin (NEURONTIN) 400 MG capsule   glipiZIDE (GLUCOTROL XL) 5 MG 24 hr tablet   metFORMIN (GLUCOPHAGE-XR) 500 MG 24 hr tablet   atorvastatin (LIPITOR) 20 MG tablet   Other Relevant Orders   CMP14+EGFR   Lipid panel   Bayer DCA Hb A1c Waived     Other   [place] cardiac risk >20% in next 10 years   Relevant Medications   atorvastatin (LIPITOR) 20 MG tablet   Other Relevant Orders   CBC with Differential/Platelet   Lipid panel      The 10-year ASCVD risk score Mikey Bussing DC Jr., et al., 2013) is: 20.1%   Values used to calculate the score:     Age: 50 years     Sex:  Male     Is Non-Hispanic African American: No     Diabetic: Yes     Tobacco smoker: No     Systolic Blood Pressure: 732 mmHg     Is BP treated: Yes     HDL Cholesterol: 52 mg/dL     Total Cholesterol: 160 mg/dL   Start Lipitor because of  ASCVD risk , Switch from lisinopril to olmesartan  Continue metformin and glipizide Follow up plan: Return in about 3 months (around 02/21/2019), or if symptoms worsen or fail to improve, for Diabetes and hypertension.  Counseling provided for all of the vaccine components Orders Placed This Encounter  Procedures  . CBC with Differential/Platelet  . CMP14+EGFR  . Lipid panel  . Bayer [facility] Hb A1c [place], MD [facility] 11/21/2018, 10:40 AM

## 2018-11-22 LAB — CBC WITH DIFFERENTIAL/PLATELET
Basophils Absolute: 0.1 10*3/uL (ref 0.0–0.2)
Basos: 1 %
EOS (ABSOLUTE): 0.2 10*3/uL (ref 0.0–0.4)
Eos: 3 %
Hematocrit: 42.3 % (ref 37.5–51.0)
Hemoglobin: 14.5 g/dL (ref 13.0–17.7)
Immature Grans (Abs): 0.1 10*3/uL (ref 0.0–0.1)
Immature Granulocytes: 1 %
Lymphocytes Absolute: 1.8 10*3/uL (ref 0.7–3.1)
Lymphs: 22 %
MCH: 27.8 pg (ref 26.6–33.0)
MCHC: 34.3 g/dL (ref 31.5–35.7)
MCV: 81 fL (ref 79–97)
Monocytes Absolute: 0.7 10*3/uL (ref 0.1–0.9)
Monocytes: 9 %
Neutrophils Absolute: 5.4 10*3/uL (ref 1.4–7.0)
Neutrophils: 64 %
Platelets: 223 10*3/uL (ref 150–450)
RBC: 5.22 x10E6/uL (ref 4.14–5.80)
RDW: 13.3 % (ref 11.6–15.4)
WBC: 8.3 10*3/uL (ref 3.4–10.8)

## 2018-11-22 LAB — LIPID PANEL
Chol/HDL Ratio: 3.5 ratio (ref 0.0–5.0)
Cholesterol, Total: 165 mg/dL (ref 100–199)
HDL: 47 mg/dL (ref >39)
LDL Calculated: 94 mg/dL (ref 0–99)
Triglycerides: 120 mg/dL (ref 0–149)
VLDL Cholesterol Cal: 24 mg/dL (ref 5–40)

## 2018-11-22 LAB — CMP14+EGFR
ALT: 7 IU/L (ref 0–44)
AST: 6 IU/L (ref 0–40)
Albumin/Globulin Ratio: 1.3 (ref 1.2–2.2)
Albumin: 4 g/dL (ref 3.8–4.8)
Alkaline Phosphatase: 110 IU/L (ref 39–117)
BUN/Creatinine Ratio: 13 (ref 10–24)
BUN: 12 mg/dL (ref 8–27)
Bilirubin Total: 0.4 mg/dL (ref 0.0–1.2)
CO2: 21 mmol/L (ref 20–29)
Calcium: 9.9 mg/dL (ref 8.6–10.2)
Chloride: 97 mmol/L (ref 96–106)
Creatinine, Ser: 0.96 mg/dL (ref 0.76–1.27)
GFR calc Af Amer: 96 mL/min/{1.73_m2} (ref >59)
GFR calc non Af Amer: 83 mL/min/{1.73_m2} (ref >59)
Globulin, Total: 3 g/dL (ref 1.5–4.5)
Glucose: 358 mg/dL — ABNORMAL HIGH (ref 65–99)
Potassium: 4.8 mmol/L (ref 3.5–5.2)
Sodium: 133 mmol/L — ABNORMAL LOW (ref 134–144)
Total Protein: 7 g/dL (ref 6.0–8.5)

## 2018-11-24 ENCOUNTER — Telehealth: Payer: Self-pay | Admitting: Family Medicine

## 2018-11-24 MED ORDER — METFORMIN HCL ER 500 MG PO TB24: 1000.0000 mg | ORAL_TABLET | Freq: Two times a day (BID) | ORAL | 2 refills | Status: DC

## 2018-11-24 NOTE — Telephone Encounter (Signed)
Patient aware of results.

## 2018-12-06 DIAGNOSIS — H25011 Cortical age-related cataract, right eye: Secondary | ICD-10-CM | POA: Diagnosis not present

## 2018-12-06 DIAGNOSIS — E113291 Type 2 diabetes mellitus with mild nonproliferative diabetic retinopathy without macular edema, right eye: Secondary | ICD-10-CM | POA: Diagnosis not present

## 2018-12-06 DIAGNOSIS — E119 Type 2 diabetes mellitus without complications: Secondary | ICD-10-CM | POA: Diagnosis not present

## 2018-12-06 DIAGNOSIS — Z961 Presence of intraocular lens: Secondary | ICD-10-CM | POA: Diagnosis not present

## 2018-12-06 DIAGNOSIS — H01023 Squamous blepharitis right eye, unspecified eyelid: Secondary | ICD-10-CM | POA: Diagnosis not present

## 2018-12-06 DIAGNOSIS — Z7984 Long term (current) use of oral hypoglycemic drugs: Secondary | ICD-10-CM | POA: Diagnosis not present

## 2019-01-17 ENCOUNTER — Telehealth: Payer: Self-pay | Admitting: Family Medicine

## 2019-01-18 NOTE — Telephone Encounter (Signed)
Patient aware of recommendations.  

## 2019-01-18 NOTE — Telephone Encounter (Signed)
Metformin typically does not cause weight gain, and usually leads to weight loss, focus really closely on eating right and eating what is good, metformin is more known for weight loss, not weight gain

## 2019-08-24 ENCOUNTER — Other Ambulatory Visit: Payer: Self-pay | Admitting: Family Medicine

## 2019-08-27 ENCOUNTER — Other Ambulatory Visit: Payer: Self-pay | Admitting: Family Medicine

## 2019-10-12 ENCOUNTER — Ambulatory Visit (INDEPENDENT_AMBULATORY_CARE_PROVIDER_SITE_OTHER): Payer: Medicare Other | Admitting: Family Medicine

## 2019-10-12 ENCOUNTER — Encounter: Payer: Self-pay | Admitting: Family Medicine

## 2019-10-12 ENCOUNTER — Other Ambulatory Visit: Payer: Self-pay

## 2019-10-12 VITALS — BP 132/87 | HR 78 | Temp 97.8°F | Ht 75.0 in | Wt 291.0 lb

## 2019-10-12 DIAGNOSIS — E1159 Type 2 diabetes mellitus with other circulatory complications: Secondary | ICD-10-CM | POA: Diagnosis not present

## 2019-10-12 DIAGNOSIS — E1149 Type 2 diabetes mellitus with other diabetic neurological complication: Secondary | ICD-10-CM | POA: Diagnosis not present

## 2019-10-12 DIAGNOSIS — Z9189 Other specified personal risk factors, not elsewhere classified: Secondary | ICD-10-CM

## 2019-10-12 DIAGNOSIS — I10 Essential (primary) hypertension: Secondary | ICD-10-CM | POA: Diagnosis not present

## 2019-10-12 MED ORDER — TRULICITY 0.75 MG/0.5ML ~~LOC~~ SOAJ
0.7500 mg | SUBCUTANEOUS | 5 refills | Status: DC
Start: 2019-10-12 — End: 2020-02-01

## 2019-10-12 MED ORDER — OLMESARTAN MEDOXOMIL 20 MG PO TABS: 20.0000 mg | ORAL_TABLET | Freq: Every day | ORAL | 3 refills | Status: DC

## 2019-10-12 MED ORDER — TRESIBA FLEXTOUCH 100 UNIT/ML ~~LOC~~ SOPN: 10.0000 [IU] | PEN_INJECTOR | Freq: Every day | SUBCUTANEOUS | 5 refills | Status: DC

## 2019-10-12 MED ORDER — METFORMIN HCL ER 500 MG PO TB24: 1000.0000 mg | ORAL_TABLET | Freq: Two times a day (BID) | ORAL | 3 refills | Status: DC

## 2019-10-12 MED ORDER — GABAPENTIN 400 MG PO CAPS: 800.0000 mg | ORAL_CAPSULE | Freq: Two times a day (BID) | ORAL | 3 refills | Status: DC

## 2019-10-12 MED ORDER — GLIPIZIDE ER 5 MG PO TB24: 5.0000 mg | ORAL_TABLET | Freq: Two times a day (BID) | ORAL | 3 refills | Status: DC

## 2019-10-12 MED ORDER — ATORVASTATIN CALCIUM 20 MG PO TABS: 20.0000 mg | ORAL_TABLET | Freq: Every day | ORAL | 3 refills | Status: DC

## 2019-10-12 NOTE — Progress Notes (Signed)
BP 132/87   Pulse 78   Temp 97.8 F (36.6 C)   Ht _0  (1.905 m)   Wt 291 lb (132 kg)   SpO2 96%   BMI 36.37 kg/m    Subjective:   Patient ID: [name]., male    DOB: [birth year], 64 y.o.   MRN: [id number]  HPI: [name]. is a 64 y.o. male presenting on 10/12/2019 for Hospitalization Follow-up (chest pain. Seen by [facility])   HPI Chest pain Patient is coming in for hospital follow-up for chest pain.  He was seen on 09/27/2019 for chest pain and was transferred from the hospital he was at [facility] overnight.  They did evaluate him for cardiac and he did see his cardiologist Dr. Sabra Heck.  Dr. Sabra Heck did love what he was seen but did not see anything acutely and has plan for cardiac catheterization in this upcoming week.  Since hospital visit he has had occasional chest pain but took 1 nitro and it went away quickly.  Type 2 diabetes mellitus Patient comes in today for recheck of his diabetes. Patient has been currently taking Metformin and glipizide, blood sugars per him have been running in the 180s although he is running his A1c up to 11 which is what they found in the hospital last week. Patient is currently on an ACE inhibitor/ARB. Patient has not seen an ophthalmologist this year. Patient denies any issues with their feet. The symptom started onset as an adult hypertension and neuropathy, takes gabapentin ARE RELATED TO DM   Hypertension Patient is currently on olmesartan, and their blood pressure today is 132/87. Patient denies any lightheadedness or dizziness. Patient denies headaches, blurred vision, chest pains, shortness of breath, or weakness. Denies any side effects from medication and is content with current medication.   Hyperlipidemia Patient is coming in for recheck of his hyperlipidemia. The patient is currently taking atorvastatin. They deny any issues with myalgias or history of liver damage from it. They deny any focal numbness or weakness  or chest pain.   Relevant past medical, surgical, family and social history reviewed and updated as indicated. Interim medical history since our last visit reviewed. Allergies and medications reviewed and updated.  Review of Systems  Constitutional: Negative for chills and fever.  Respiratory: Positive for chest tightness. Negative for shortness of breath and wheezing.   Cardiovascular: Positive for chest pain. Negative for leg swelling.  Musculoskeletal: Negative for back pain and gait problem.  Skin: Negative for rash.  Neurological: Negative for dizziness, weakness and numbness.  All other systems reviewed and are negative.   Per HPI unless specifically indicated above   Allergies as of 10/12/2019      Reactions   Bee Venom       Medication List       Accurate as of October 12, 2019  2:25 PM. If you have any questions, ask your nurse or doctor.        aspirin EC 81 MG tablet Take 81 mg by mouth daily. Swallow whole.   atorvastatin 20 MG tablet Commonly known as: LIPITOR Take 1 tablet (20 mg total) by mouth daily.   gabapentin 400 MG capsule Commonly known as: NEURONTIN Take 2 capsules (800 mg total) by mouth 2 (two) times daily.   glipiZIDE 5 MG 24 hr tablet Commonly known as: GLUCOTROL XL Take 1 tablet (5 mg total) by mouth 2 (two) times daily.   ibuprofen 600 MG tablet Commonly known  as: ADVIL Take 1 tablet (600 mg total) by mouth every 8 (eight) hours as needed.   metFORMIN 500 MG 24 hr tablet Commonly known as: GLUCOPHAGE-XR Take 2 tablets (1,000 mg total) by mouth 2 (two) times daily. What changed: additional instructions Changed by: Fransisca Kaufmann Ambika Zettlemoyer, MD   nitroGLYCERIN 0.4 MG SL tablet Commonly known as: NITROSTAT Place 0.4 mg under the tongue every 5 (five) minutes as needed for chest pain.   olmesartan 20 MG tablet Commonly known as: BENICAR Take 1 tablet (20 mg total) by mouth daily.   Tyler Aas FlexTouch 100 UNIT/ML FlexTouch Pen Generic drug:  insulin degludec Inject 0.1 mLs (10 Units total) into the skin daily. Started by: Fransisca Kaufmann Hershal Eriksson, MD   Trulicity 7.09 GG/8.3MO Sopn Generic drug: Dulaglutide Inject 0.5 mLs (0.75 mg total) into the skin once a week. Started by: Fransisca Kaufmann Braedon Sjogren, MD        Objective:   BP 132/87   Pulse 78   Temp 97.8 F (36.6 C)   Ht _0  (1.905 m)   Wt 291 lb (132 kg)   SpO2 96%   BMI 36.37 kg/m   Wt Readings from Last 3 Encounters:  10/12/19 291 lb (132 kg)  11/21/18 287 lb 3.2 oz (130.3 kg)  08/18/18 279 lb (126.6 kg)    Physical Exam Vitals and nursing note reviewed.  Constitutional:      General: He is not in acute distress.    Appearance: He is well-developed. He is not diaphoretic.  Eyes:     General: No scleral icterus.    Conjunctiva/sclera: Conjunctivae normal.  Neck:     Thyroid: No thyromegaly.  Cardiovascular:     Rate and Rhythm: Normal rate and regular rhythm.     Heart sounds: Normal heart sounds. No murmur heard.   Pulmonary:     Effort: Pulmonary effort is normal. No respiratory distress.     Breath sounds: Normal breath sounds. No wheezing.  Musculoskeletal:        General: Normal range of motion.     Cervical back: Neck supple.  Lymphadenopathy:     Cervical: No cervical adenopathy.  Skin:    General: Skin is warm and dry.     Findings: No rash.  Neurological:     Mental Status: He is alert and oriented to person, place, and time.     Coordination: Coordination normal.  Psychiatric:        Behavior: Behavior normal.       Assessment & Plan:   Problem List Items Addressed This Visit      Cardiovascular and Mediastinum   Hypertension associated with diabetes ([place])   Relevant Medications   aspirin EC 81 MG tablet   nitroGLYCERIN (NITROSTAT) 0.4 MG SL tablet   glipiZIDE (GLUCOTROL XL) 5 MG 24 hr tablet   atorvastatin (LIPITOR) 20 MG tablet   olmesartan (BENICAR) 20 MG tablet   metFORMIN (GLUCOPHAGE-XR) 500 MG 24 hr tablet    Dulaglutide (TRULICITY) 2.94 TM/5.4YT SOPN   insulin degludec (TRESIBA FLEXTOUCH) 100 UNIT/ML FlexTouch Pen   Other Relevant Orders   CBC with Differential/Platelet   CMP14+EGFR     Endocrine   Type 2 diabetes mellitus with neurological complications (HCC) - Primary   Relevant Medications   aspirin EC 81 MG tablet   glipiZIDE (GLUCOTROL XL) 5 MG 24 hr tablet   gabapentin (NEURONTIN) 400 MG capsule   atorvastatin (LIPITOR) 20 MG tablet   olmesartan (BENICAR) 20 MG tablet  metFORMIN (GLUCOPHAGE-XR) 500 MG 24 hr tablet   Dulaglutide (TRULICITY) 9.80 OX/2.3NP SOPN   insulin degludec (TRESIBA FLEXTOUCH) 100 UNIT/ML FlexTouch Pen   Other Relevant Orders   CBC with Differential/Platelet   CMP14+EGFR     Other   [place] cardiac risk >20% in next 10 years   Relevant Medications   atorvastatin (LIPITOR) 20 MG tablet   Other Relevant Orders   CBC with Differential/Platelet   CMP14+EGFR    For his diabetes we are going to continue Metformin and glipizide for now and added Trulicity and [place] and get samples for both to the patient and education on how to use them.  He is to call in 1 week with some blood sugars and also get him set up with our clinical pharmacist for diabetic education.  Follow up plan: Return in about 4 weeks (around 11/09/2019), or if symptoms worsen or fail to improve, for [place] next week, 1 month with me.  Counseling provided for all of the vaccine components Orders Placed This Encounter  Procedures  . CBC with Differential/Platelet  . [place] Lakyla Biswas, MD [facility] 10/12/2019, 2:25 PM

## 2019-10-13 ENCOUNTER — Ambulatory Visit (INDEPENDENT_AMBULATORY_CARE_PROVIDER_SITE_OTHER): Payer: Medicare Other | Admitting: Pharmacist

## 2019-10-13 DIAGNOSIS — E1149 Type 2 diabetes mellitus with other diabetic neurological complication: Secondary | ICD-10-CM

## 2019-10-13 LAB — CMP14+EGFR
ALT: 11 IU/L (ref 0–44)
AST: 12 IU/L (ref 0–40)
Albumin/Globulin Ratio: 1.4 (ref 1.2–2.2)
Albumin: 4.1 g/dL (ref 3.8–4.8)
Alkaline Phosphatase: 120 IU/L (ref 48–121)
BUN/Creatinine Ratio: 12 (ref 10–24)
BUN: 12 mg/dL (ref 8–27)
Bilirubin Total: 0.5 mg/dL (ref 0.0–1.2)
CO2: 19 mmol/L — ABNORMAL LOW (ref 20–29)
Calcium: 9.5 mg/dL (ref 8.6–10.2)
Chloride: 99 mmol/L (ref 96–106)
Creatinine, Ser: 1.01 mg/dL (ref 0.76–1.27)
GFR calc Af Amer: 90 mL/min/{1.73_m2} (ref >59)
GFR calc non Af Amer: 78 mL/min/{1.73_m2} (ref >59)
Globulin, Total: 2.9 g/dL (ref 1.5–4.5)
Glucose: 405 mg/dL (ref 65–99)
Potassium: 4.9 mmol/L (ref 3.5–5.2)
Sodium: 133 mmol/L — ABNORMAL LOW (ref 134–144)
Total Protein: 7 g/dL (ref 6.0–8.5)

## 2019-10-13 LAB — CBC WITH DIFFERENTIAL/PLATELET
Basophils Absolute: 0.1 10*3/uL (ref 0.0–0.2)
Basos: 1 %
EOS (ABSOLUTE): 0.2 10*3/uL (ref 0.0–0.4)
Eos: 2 %
Hematocrit: 41.7 % (ref 37.5–51.0)
Hemoglobin: 13.9 g/dL (ref 13.0–17.7)
Immature Grans (Abs): 0 10*3/uL (ref 0.0–0.1)
Immature Granulocytes: 0 %
Lymphocytes Absolute: 1.8 10*3/uL (ref 0.7–3.1)
Lymphs: 25 %
MCH: 27.2 pg (ref 26.6–33.0)
MCHC: 33.3 g/dL (ref 31.5–35.7)
MCV: 82 fL (ref 79–97)
Monocytes Absolute: 0.6 10*3/uL (ref 0.1–0.9)
Monocytes: 9 %
Neutrophils Absolute: 4.7 10*3/uL (ref 1.4–7.0)
Neutrophils: 63 %
Platelets: 221 10*3/uL (ref 150–450)
RBC: 5.11 x10E6/uL (ref 4.14–5.80)
RDW: 14.3 % (ref 11.6–15.4)
WBC: 7.4 10*3/uL (ref 3.4–10.8)

## 2019-10-13 NOTE — Progress Notes (Signed)
      10/13/2019 Name: [name]. MRN: [id number] DOB: Nov 05, 1955   S:  48 yoM presents for diabetes evaluation, education, and management Patient was referred and last seen by Primary Care Provider on 10/12/19.  Insurance coverage/medication affordability: medicare  Patient reports adherence with medications. . Current diabetes medications include: metformin, glipizde . Current hypertension medications include: olmesartan Goal 130/80 . Current hyperlipidemia medications include: atorvastatin   Patient denies hypoglycemic events.   Patient reported dietary habits: Eats 3 meals/day Discussed meal planning options and Plate method for healthy eating Avoid sugary drinks and desserts Incorporate balanced protein, non starchy veggies, 1 serving of carbohydrate Increase water intake . Increase physical activity as able.  Patient-reported exercise habits: n/a  Patient denies nocturia (nighttime urination).  Patient denies neuropathy (nerve pain).  Patient denies visual changes.  Patient denies self foot exams.    O:  Lab Results  Component Value Date   HGBA1C >14.0 (H) 11/21/2018   Lipid Panel     Component Value Date/Time   CHOL 165 11/21/2018 1059   TRIG 120 11/21/2018 1059   HDL 47 11/21/2018 1059   CHOLHDL 3.5 11/21/2018 1059   LDLCALC 94 11/21/2018 1059     Home fasting blood sugars: n/a   2 hour post-meal/random blood sugars: n/a.   A/P:  Diabetes T2DM currently UNCONTROLLED.  A1C >14%  -Started basal insulin BASAGLAR (insulin GLARGINE) 10 UNITS  WILL TITRATE AS NEEDED  APPLIED & APPROVED FOR [employer] PATIENT ASSISTANCE, MEDICATION TO SHIP TO PATIENT'S HOME  -Started GLP-1 TRULICITY (generic name DULAGLUTIDE) 0.75MG  SQ WEEKLY  WILL TITRATE AS NEEDED--INCREASE TO 1.5MG  SQ WEEKLY IN 4 WEEKS  APPLIED & APPROVED FOR [employer] PATIENT ASSISTANCE, MEDICATION TO SHIP TO PATIENT'S HOME  -Continue metformin  -Continue glipizide until  medication shipment arrives--will discontinue once new medications arrived/started  -Extensively discussed pathophysiology of diabetes, recommended lifestyle interventions, dietary effects on blood sugar control  -Counseled on s/sx of and management of hypoglycemia  -Next A1C anticipated 3 months.    Written patient instructions provided.  Total time in face to face counseling 30 minutes.   Follow up PCP visit on 11/01/19   Regina Eck, PharmD, BCPS Clinical Pharmacist, [facility] Phone [phone]

## 2019-10-18 ENCOUNTER — Telehealth: Payer: Self-pay | Admitting: Family Medicine

## 2019-10-19 NOTE — Telephone Encounter (Signed)
FBG 200 CATH TOMORROW---HOLD INSULIN TRESIBA SAMPLE LEFT UP FRONT FOR PATIENT TO PICK UP SIG: 10 UNITS SQ DAILY TCK#[id number], EXP 1/22

## 2019-10-24 ENCOUNTER — Encounter: Payer: Self-pay | Admitting: Pharmacist

## 2019-11-01 ENCOUNTER — Encounter: Payer: Self-pay | Admitting: Family Medicine

## 2019-11-01 ENCOUNTER — Other Ambulatory Visit: Payer: Self-pay

## 2019-11-01 ENCOUNTER — Ambulatory Visit (INDEPENDENT_AMBULATORY_CARE_PROVIDER_SITE_OTHER): Payer: Medicare Other | Admitting: Family Medicine

## 2019-11-01 VITALS — BP 117/63 | HR 67 | Ht 75.0 in | Wt 290.0 lb

## 2019-11-01 DIAGNOSIS — I10 Essential (primary) hypertension: Secondary | ICD-10-CM | POA: Diagnosis not present

## 2019-11-01 DIAGNOSIS — E1159 Type 2 diabetes mellitus with other circulatory complications: Secondary | ICD-10-CM | POA: Diagnosis not present

## 2019-11-01 DIAGNOSIS — E1149 Type 2 diabetes mellitus with other diabetic neurological complication: Secondary | ICD-10-CM

## 2019-11-01 DIAGNOSIS — I152 Hypertension secondary to endocrine disorders: Secondary | ICD-10-CM

## 2019-11-01 DIAGNOSIS — K047 Periapical abscess without sinus: Secondary | ICD-10-CM | POA: Diagnosis not present

## 2019-11-01 LAB — BAYER DCA HB A1C WAIVED: HB A1C (BAYER DCA - WAIVED): 9.4 % — ABNORMAL HIGH (ref <7.0)

## 2019-11-01 MED ORDER — IBUPROFEN 600 MG PO TABS: 600.0000 mg | ORAL_TABLET | Freq: Three times a day (TID) | ORAL | 3 refills | Status: DC | PRN

## 2019-11-01 NOTE — Progress Notes (Signed)
BP 117/63    Pulse 67    Ht 6\' 3"  (1.905 m)    Wt 290 lb (131.5 kg)    BMI 36.25 kg/m    Subjective:   Patient ID: Ethan Price., male    DOB: [birth year], 64 y.o.   MRN: [id number]  HPI: Ethan Boyte. is a 64 y.o. male presenting on 11/01/2019 for Medical Management of Chronic Issues and Diabetes   HPI Type 2 diabetes mellitus Patient comes in today for recheck of his diabetes. Patient has been currently taking glipizide and Basaglar and Metformin and Trulicity. Patient is currently on an ACE inhibitor/ARB. Patient has not seen an ophthalmologist this year. Patient denies any issues with their feet. The symptom started onset as an adult hypertension ARE RELATED TO DM   Hypertension Patient is currently on olmesartan, and their blood pressure today is 117/63. Patient denies any lightheadedness or dizziness. Patient denies headaches, blurred vision, chest pains, shortness of breath, or weakness. Denies any side effects from medication and is content with current medication.   Relevant past medical, surgical, family and social history reviewed and updated as indicated. Interim medical history since our last visit reviewed. Allergies and medications reviewed and updated.  Review of Systems  Constitutional: Negative for chills and fever.  Respiratory: Negative for shortness of breath and wheezing.   Cardiovascular: Negative for chest pain and leg swelling.  Musculoskeletal: Negative for back pain and gait problem.  Skin: Negative for rash.  Neurological: Negative for dizziness, weakness and numbness.  All other systems reviewed and are negative.   Per HPI unless specifically indicated above   Allergies as of 11/01/2019      Reactions   Bee Venom       Medication List       Accurate as of Ethan 7, 2021  2:00 PM. If you have any questions, ask your nurse or doctor.        aspirin EC 81 MG tablet Take 81 mg by mouth daily. Swallow whole.   atorvastatin 20 MG  tablet Commonly known as: LIPITOR Take 1 tablet (20 mg total) by mouth daily.   Basaglar KwikPen 100 UNIT/ML Inject 20 Units into the skin daily.   gabapentin 400 MG capsule Commonly known as: NEURONTIN Take 2 capsules (800 mg total) by mouth 2 (two) times daily.   glipiZIDE 5 MG 24 hr tablet Commonly known as: GLUCOTROL XL Take 1 tablet (5 mg total) by mouth 2 (two) times daily.   ibuprofen 600 MG tablet Commonly known as: ADVIL Take 1 tablet (600 mg total) by mouth every 8 (eight) hours as needed.   metFORMIN 500 MG 24 hr tablet Commonly known as: GLUCOPHAGE-XR Take 2 tablets (1,000 mg total) by mouth 2 (two) times daily.   nitroGLYCERIN 0.4 MG SL tablet Commonly known as: NITROSTAT Place 0.4 mg under the tongue every 5 (five) minutes as needed for chest pain.   olmesartan 20 MG tablet Commonly known as: BENICAR Take 1 tablet (20 mg total) by mouth daily.   Trulicity 0.10 XN/2.3FT Sopn Generic drug: Dulaglutide Inject 0.5 mLs (0.75 mg total) into the skin once a week. What changed: how much to take        Objective:   BP 117/63    Pulse 67    Ht 6\' 3"  (1.905 m)    Wt 290 lb (131.5 kg)    BMI 36.25 kg/m   Wt Readings from Last 3 Encounters:  11/01/19 290 lb (  131.5 kg)  10/12/19 291 lb (132 kg)  11/21/18 287 lb 3.2 oz (130.3 kg)    Physical Exam Vitals and nursing note reviewed.  Constitutional:      General: He is not in acute distress.    Appearance: He is well-developed. He is not diaphoretic.  Eyes:     General: No scleral icterus.    Conjunctiva/sclera: Conjunctivae normal.  Neck:     Thyroid: No thyromegaly.  Cardiovascular:     Rate and Rhythm: Normal rate and regular rhythm.     Heart sounds: Normal heart sounds. No murmur heard.   Pulmonary:     Effort: Pulmonary effort is normal. No respiratory distress.     Breath sounds: Normal breath sounds. No wheezing.  Musculoskeletal:        General: Normal range of motion.     Cervical back: Neck  supple.  Lymphadenopathy:     Cervical: No cervical adenopathy.  Skin:    General: Skin is warm and dry.     Findings: No rash.  Neurological:     Mental Status: He is alert and oriented to person, place, and time.     Coordination: Coordination normal.  Psychiatric:        Behavior: Behavior normal.     A1c 9.4 today  Assessment & Plan:   Problem List Items Addressed This Visit      Cardiovascular and Mediastinum   Hypertension associated with diabetes ([place])     Endocrine   Type 2 diabetes mellitus with neurological complications ([place]) - Primary   Relevant Orders   Bayer DCA Hb A1c Waived      A1c improved and is now at 9.4, is only been doing 2 weeks of the Basaglar, his a.m. blood sugars are still over 200 and is going up to 300 which is an improvement from the 400s he was before, recommended to increase Basaglar to 24 units daily  Gave prescription for ibuprofen for arthritis, will likely need to stop glipizide in the future but will hold off until it comes down a little bit better. Follow up plan: Return in about 3 months (around 02/01/2020), or if symptoms worsen or fail to improve, for Needs an appointment with Almyra Free in 2 to 4 weeks, asked Almyra Free.  Diabetes with me.  Counseling provided for all of the vaccine components Orders Placed This Encounter  Procedures   Bayer [place] Hb A1c [place] Ethan Linam, MD [facility] 11/01/2019, 2:00 PM

## 2020-01-05 ENCOUNTER — Other Ambulatory Visit: Payer: Self-pay | Admitting: Family Medicine

## 2020-02-01 ENCOUNTER — Other Ambulatory Visit: Payer: Self-pay

## 2020-02-01 ENCOUNTER — Encounter: Payer: Self-pay | Admitting: Family Medicine

## 2020-02-01 ENCOUNTER — Ambulatory Visit (INDEPENDENT_AMBULATORY_CARE_PROVIDER_SITE_OTHER): Payer: Medicare Other | Admitting: Family Medicine

## 2020-02-01 VITALS — BP 103/65 | HR 82 | Temp 98.0°F | Ht 75.0 in | Wt 285.0 lb

## 2020-02-01 DIAGNOSIS — Z23 Encounter for immunization: Secondary | ICD-10-CM | POA: Diagnosis not present

## 2020-02-01 DIAGNOSIS — E1169 Type 2 diabetes mellitus with other specified complication: Secondary | ICD-10-CM | POA: Diagnosis not present

## 2020-02-01 DIAGNOSIS — E1159 Type 2 diabetes mellitus with other circulatory complications: Secondary | ICD-10-CM

## 2020-02-01 DIAGNOSIS — E1149 Type 2 diabetes mellitus with other diabetic neurological complication: Secondary | ICD-10-CM | POA: Diagnosis not present

## 2020-02-01 DIAGNOSIS — I152 Hypertension secondary to endocrine disorders: Secondary | ICD-10-CM

## 2020-02-01 DIAGNOSIS — E785 Hyperlipidemia, unspecified: Secondary | ICD-10-CM | POA: Insufficient documentation

## 2020-02-01 LAB — BAYER DCA HB A1C WAIVED: HB A1C (BAYER DCA - WAIVED): 7.5 % — ABNORMAL HIGH (ref <7.0)

## 2020-02-01 MED ORDER — BASAGLAR KWIKPEN 100 UNIT/ML ~~LOC~~ SOPN: 25.0000 [IU] | PEN_INJECTOR | Freq: Every day | SUBCUTANEOUS | 3 refills | Status: DC

## 2020-02-01 MED ORDER — TRULICITY 1.5 MG/0.5ML ~~LOC~~ SOAJ: 1.5000 mg | SUBCUTANEOUS | 3 refills | Status: DC

## 2020-02-01 NOTE — Progress Notes (Signed)
BP 103/65   Pulse 82   Temp 98 F (36.7 C)   Ht 6\' 3"  (1.905 m)   Wt 285 lb (129.3 kg)   SpO2 99%   BMI 35.62 kg/m    Subjective:   Patient ID: Ethan Price., male    DOB: 1956/02/04, 64 y.o.   MRN: [id number]  HPI: Ethan France. is a 64 y.o. male presenting on 02/01/2020 for Medical Management of Chronic Issues and Diabetes   HPI Type 2 diabetes mellitus Patient comes in today for recheck of his diabetes. Patient has been currently taking Basaglar and glipizide and Metformin. Patient is currently on an ACE inhibitor/ARB. Patient has not seen an ophthalmologist this year. Patient denies any issues with their feet. The symptom started onset as an adult hypertension and hyperlipidemia ARE RELATED TO DM   Hypertension Patient is currently on olmesartan, and their blood pressure today is 103/65. Patient denies any lightheadedness or dizziness. Patient denies headaches, blurred vision, chest pains, shortness of breath, or weakness. Denies any side effects from medication and is content with current medication.   Hyperlipidemia Patient is coming in for recheck of his hyperlipidemia. The patient is currently taking atorvastatin. They deny any issues with myalgias or history of liver damage from it. They deny any focal numbness or weakness or chest pain.   Relevant past medical, surgical, family and social history reviewed and updated as indicated. Interim medical history since our last visit reviewed. Allergies and medications reviewed and updated.  Review of Systems  Constitutional: Negative for chills and fever.  Eyes: Negative for visual disturbance.  Respiratory: Negative for shortness of breath and wheezing.   Cardiovascular: Negative for chest pain and leg swelling.  Musculoskeletal: Negative for back pain and gait problem.  Skin: Negative for rash.  Neurological: Negative for dizziness, weakness, light-headedness and numbness.  All other systems reviewed and are  negative.   Per HPI unless specifically indicated above   Allergies as of 02/01/2020      Reactions   Bee Venom       Medication List       Accurate as of February 01, 2020 11:59 PM. If you have any questions, ask your nurse or doctor.        STOP taking these medications   glipiZIDE 5 MG 24 hr tablet Commonly known as: GLUCOTROL XL Stopped by: Fransisca Kaufmann Arnoldo Hildreth, MD   Trulicity 9.56 LO/7.5IE Sopn Generic drug: Dulaglutide Replaced by: Trulicity 1.5 PP/2.9JJ Sopn Stopped by: Fransisca Kaufmann Aelyn Stanaland, MD     TAKE these medications   aspirin EC 81 MG tablet Take 81 mg by mouth daily. Swallow whole.   atorvastatin 20 MG tablet Commonly known as: LIPITOR Take 1 tablet (20 mg total) by mouth daily.   Basaglar KwikPen 100 UNIT/ML Inject 25-35 Units into the skin daily. What changed: how much to take Changed by: Fransisca Kaufmann Eliette Drumwright, MD   gabapentin 400 MG capsule Commonly known as: NEURONTIN Take 2 capsules (800 mg total) by mouth 2 (two) times daily.   GNP UltiCare Pen Needles 32G X 6 MM Misc Generic drug: Insulin Pen Needle EVERY DAY   ibuprofen 600 MG tablet Commonly known as: ADVIL Take 1 tablet (600 mg total) by mouth every 8 (eight) hours as needed.   metFORMIN 500 MG 24 hr tablet Commonly known as: GLUCOPHAGE-XR Take 2 tablets (1,000 mg total) by mouth 2 (two) times daily.   nitroGLYCERIN 0.4 MG SL tablet Commonly known as: NITROSTAT  Place 0.4 mg under the tongue every 5 (five) minutes as needed for chest pain.   olmesartan 20 MG tablet Commonly known as: BENICAR Take 1 tablet (20 mg total) by mouth daily.   Trulicity 1.5 CH/8.8FO Sopn Generic drug: Dulaglutide Inject 1.5 mg into the skin once a week. Replaces: Trulicity 2.77 AJ/2.8NO Sopn Started by: Fransisca Kaufmann Leilanee Righetti, MD        Objective:   BP 103/65   Pulse 82   Temp 98 F (36.7 C)   Ht 6\' 3"  (1.905 m)   Wt 285 lb (129.3 kg)   SpO2 99%   BMI 35.62 kg/m   Wt Readings from Last 3  Encounters:  02/01/20 285 lb (129.3 kg)  11/01/19 290 lb (131.5 kg)  10/12/19 291 lb (132 kg)    Physical Exam Vitals and nursing note reviewed.  Constitutional:      General: He is not in acute distress.    Appearance: He is well-developed. He is not diaphoretic.  Eyes:     General: No scleral icterus.    Conjunctiva/sclera: Conjunctivae normal.  Neck:     Thyroid: No thyromegaly.  Cardiovascular:     Rate and Rhythm: Normal rate and regular rhythm.     Heart sounds: Normal heart sounds. No murmur heard.   Pulmonary:     Effort: Pulmonary effort is normal. No respiratory distress.     Breath sounds: Normal breath sounds. No wheezing.  Musculoskeletal:        General: Normal range of motion.     Cervical back: Neck supple.  Lymphadenopathy:     Cervical: No cervical adenopathy.  Skin:    General: Skin is warm and dry.     Findings: No rash.  Neurological:     Mental Status: He is alert and oriented to person, place, and time.     Coordination: Coordination normal.  Psychiatric:        Behavior: Behavior normal.       Assessment & Plan:   Problem List Items Addressed This Visit      Cardiovascular and Mediastinum   Hypertension associated with diabetes ([place])   Relevant Medications   Dulaglutide (TRULICITY) 1.5 MV/6.7MC SOPN   Insulin Glargine (BASAGLAR KWIKPEN) 100 UNIT/ML     Endocrine   Type 2 diabetes mellitus with neurological complications (HCC) - Primary   Relevant Medications   Dulaglutide (TRULICITY) 1.5 NO/7.0JG SOPN   Insulin Glargine (BASAGLAR KWIKPEN) 100 UNIT/ML   Other Relevant Orders   Bayer DCA Hb A1c Waived (Completed)   Hyperlipidemia associated with type 2 diabetes mellitus (HCC)   Relevant Medications   Dulaglutide (TRULICITY) 1.5 GE/3.6OQ SOPN   Insulin Glargine (BASAGLAR KWIKPEN) 100 UNIT/ML    Other Visit Diagnoses    Need for Tdap vaccination       Relevant Orders   Tdap vaccine greater than or equal to 7yo IM (Completed)   Need  for prophylactic vaccination against Streptococcus pneumoniae (pneumococcus)       Relevant Orders   Pneumococcal conjugate vaccine 13-valent (Completed)      Increase his Basaglar to 28 units, DC his glipizide, continue the Metformin at 500 mg twice daily that he has been taking.  His A1c is improved to 7.5 but may need slightly more.  Continue the Trulicity 1.5 mg. Follow up plan: Return in about 3 months (around 05/03/2020), or if symptoms worsen or fail to improve, for Diabetes and hypertension and hyperlipidemia.  Counseling provided for all of the vaccine  components Orders Placed This Encounter  Procedures  . Tdap vaccine greater than or equal to 7yo IM  . Pneumococcal conjugate vaccine 13-valent  . Bayer [facility] Hb A1c [place], MD [facility] 02/06/2020, 9:27 PM

## 2020-02-01 NOTE — Patient Instructions (Signed)
Increase his Basaglar to 28 units, DC his glipizide, continue the Metformin at 500 mg twice daily that he has been taking.  His A1c is improved to 7.5 but may need slightly more.  Continue the Trulicity 1.5 mg.

## 2020-05-06 ENCOUNTER — Ambulatory Visit (INDEPENDENT_AMBULATORY_CARE_PROVIDER_SITE_OTHER): Payer: Medicare Other | Admitting: Family Medicine

## 2020-05-06 ENCOUNTER — Ambulatory Visit (INDEPENDENT_AMBULATORY_CARE_PROVIDER_SITE_OTHER): Payer: Medicare Other

## 2020-05-06 ENCOUNTER — Other Ambulatory Visit: Payer: Self-pay

## 2020-05-06 ENCOUNTER — Encounter: Payer: Self-pay | Admitting: Family Medicine

## 2020-05-06 VITALS — BP 95/62 | HR 101 | Ht 75.0 in | Wt 266.0 lb

## 2020-05-06 DIAGNOSIS — U071 COVID-19: Secondary | ICD-10-CM

## 2020-05-06 DIAGNOSIS — R251 Tremor, unspecified: Secondary | ICD-10-CM

## 2020-05-06 DIAGNOSIS — I152 Hypertension secondary to endocrine disorders: Secondary | ICD-10-CM

## 2020-05-06 DIAGNOSIS — J1282 Pneumonia due to coronavirus disease 2019: Secondary | ICD-10-CM | POA: Diagnosis not present

## 2020-05-06 DIAGNOSIS — R531 Weakness: Secondary | ICD-10-CM

## 2020-05-06 DIAGNOSIS — E1169 Type 2 diabetes mellitus with other specified complication: Secondary | ICD-10-CM

## 2020-05-06 DIAGNOSIS — E1159 Type 2 diabetes mellitus with other circulatory complications: Secondary | ICD-10-CM

## 2020-05-06 DIAGNOSIS — Z125 Encounter for screening for malignant neoplasm of prostate: Secondary | ICD-10-CM

## 2020-05-06 DIAGNOSIS — E1149 Type 2 diabetes mellitus with other diabetic neurological complication: Secondary | ICD-10-CM

## 2020-05-06 DIAGNOSIS — E785 Hyperlipidemia, unspecified: Secondary | ICD-10-CM

## 2020-05-06 LAB — BAYER DCA HB A1C WAIVED: HB A1C (BAYER DCA - WAIVED): 9.7 % — ABNORMAL HIGH (ref <7.0)

## 2020-05-06 MED ORDER — PREDNISONE 20 MG PO TABS: ORAL_TABLET | ORAL | 0 refills | Status: DC

## 2020-05-06 NOTE — Progress Notes (Signed)
Vitals: BP 95/62 HR 101 97 SpO2 RR 14  HPI Pt is a 35 y male coming in for a recheck on chronic conditions as well as follow up after having COVID-19. Pt reports having contracted COVID in December which progressed to pneumonia and he was hospitalized for several days. He states that he experienced a few episodes of atrial fibrillation and palpitations. Since recovery, pt states that he has been experiencing decreased taste, smell, hearing, shortness of breath while walking, chest tightness on inspiration, decreased appetite, tremors in his hands and feet, and unsteadiness on his feet leaving him in a wheelchair and on home O2 as needed, 2 to 3 L nasal cannula throughout the day. Pt states he has had a significant decrease in his ability to perform activities of daily living since the pneumonia, he has been attempting to self rehabilitate with help help from his wife and use of a walker to minimal success. Pt denies currently having shortness of breath, chest pain, or fevers. Heart rate today is regular rhythm, bilateral crackles can be heard on inspiration at the lung bases.  He is still feeling a lot weaker since the COVID as well.  Diabetes  Pt is taking dulaglutide, metformin, and glargine for blood sugar control as well as gabapentin for neuropathy. Pt states the neuropathy has been worse since having COVID. He states home blood sugars have been running around 250 consistently though he thinks he has been eating less lately. He states that he has lost weight over the past few months but gained a little back since using the wheelchair. He has had increased pain in the left foot but is not experiencing any currently. A1c today is 9.7% which is up from previous. Fine touch is diminished in the left foot, no ulcerations or wounds bilaterally.   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 Endorses fatigue HEENT: endorses diminished hearing bilaterally Heart: denies chest pain Lungs: denies SOB or difficulty breathhing GI: endorses decreased appetite, denies abdominal pain MSK: endorses weakness and trembling Neuro: endorses numbness in the feet Pysch: denies mood changes  PE General: appears fatigued, no acute distress HEENT: TM intact bilat, mild cerumen, PERRLA Cardiac: RRR, no m/r/g Pulm: +crackles at the base of the lungs bilat.  MSK: normal grip strength, unable to stand up without assistance Neuro: diminished fine tough in the left foot. Finger to nose test normal Psych: appropriate affect  Labs/Imaging Hb A1c CBC w/ differential CMP Lipid Panel PSA CXR  Assessment: Diabetes mellitus Type 2 Hypertension Associated with diabetes Prostate cancer screening Pneumonia due to COVID-19 virus Weakness Tremor  Pt sugars have been high requiring increased control, BP today was 95/62 which is low. Pt has been experiencing symptoms of systemic  inflammation since having COVID-19 leading to weakness, difficulty doing tasks without being short of breath, as well as his other symptoms as listed in HPI. CXR shows sign of vascular congestion but no consolidation or infiltrates.  Plan Pt instructed to increase Basalglar to 34 units daily, hold olmesartan for now due to low blood pressure. Pt will continue O2 as needed and take prednisone 20mg  po BID for 5 days to relieve respiratory symptoms. Pt referred to physical therapy for help regaining ambulatory function and made aware of post COVID-19 sequelae that  he may experience. He is instructed to drink plenty of fluid and to be seen sooner if his condition worsens.  Damaris Hippo 05/06/2020  Problem List  Items Addressed This Visit      Cardiovascular and Mediastinum   Hypertension associated with diabetes (HCC)   Relevant Orders   CBC with Differential/Platelet (Completed)   CMP14+EGFR (Completed)   Lipid panel (Completed)   Bayer DCA Hb A1c Waived (Completed)     Endocrine   Type 2 diabetes mellitus with neurological complications (HCC) - Primary   Relevant Orders   CBC with Differential/Platelet (Completed)   CMP14+EGFR (Completed)   Lipid panel (Completed)   Bayer DCA Hb A1c Waived (Completed)   Hyperlipidemia associated with type 2 diabetes mellitus (HCC)   Relevant Orders   CBC with Differential/Platelet (Completed)   CMP14+EGFR (Completed)   Lipid panel (Completed)   Bayer DCA Hb A1c Waived (Completed)    Other Visit Diagnoses    Prostate cancer screening       Relevant Orders   PR PSA, TOTAL SCREENING   Pneumonia due to COVID-19 virus       treated Dec 12th   Relevant Medications   predniSONE (DELTASONE) 20 MG tablet   Other Relevant Orders   DG Chest 2 View (Completed)   Ambulatory referral to Physical Therapy   Weakness       Relevant Medications   predniSONE (DELTASONE) 20 MG tablet   Other Relevant Orders   Ambulatory referral to Physical Therapy   Tremor          Patient seen and examined with Damaris Hippo, PA student.  Agree with assessment and plan above.  With symptoms of tremors and recent COVID-pneumonia, it appears that he still recovering and just down on energy still.  We instructed him to take a short course of prednisone to see if to help with the inflammation and increase his Basaglar to help with the sugars at least temporarily especially since he is going on the prednisone.  We also recommended him going to physical therapy to see if we can get him back with some strength and energy. Blood pressure is low so we are going to have him hold the olmesartan for the foreseeable future. Arville Care, MD [facility] 05/07/2020,  10:37 PM

## 2020-05-06 NOTE — Patient Instructions (Signed)
Increase his Basaglar from 28 units daily to 34 units daily  Hold olmesartan/Benicar because of low blood pressure.  Continue oxygen 2 to 3 L as needed

## 2020-05-07 LAB — CBC WITH DIFFERENTIAL/PLATELET
Basophils Absolute: 0.1 10*3/uL (ref 0.0–0.2)
Basos: 1 %
EOS (ABSOLUTE): 0.2 10*3/uL (ref 0.0–0.4)
Eos: 2 %
Hematocrit: 37.1 % — ABNORMAL LOW (ref 37.5–51.0)
Hemoglobin: 12.7 g/dL — ABNORMAL LOW (ref 13.0–17.7)
Immature Grans (Abs): 0.2 10*3/uL — ABNORMAL HIGH (ref 0.0–0.1)
Immature Granulocytes: 2 %
Lymphocytes Absolute: 2.4 10*3/uL (ref 0.7–3.1)
Lymphs: 21 %
MCH: 28.7 pg (ref 26.6–33.0)
MCHC: 34.2 g/dL (ref 31.5–35.7)
MCV: 84 fL (ref 79–97)
Monocytes Absolute: 1.1 10*3/uL — ABNORMAL HIGH (ref 0.1–0.9)
Monocytes: 9 %
Neutrophils Absolute: 7.5 10*3/uL — ABNORMAL HIGH (ref 1.4–7.0)
Neutrophils: 65 %
Platelets: 289 10*3/uL (ref 150–450)
RBC: 4.43 x10E6/uL (ref 4.14–5.80)
RDW: 14 % (ref 11.6–15.4)
WBC: 11.5 10*3/uL — ABNORMAL HIGH (ref 3.4–10.8)

## 2020-05-07 LAB — CMP14+EGFR
ALT: 7 IU/L (ref 0–44)
AST: 8 IU/L (ref 0–40)
Albumin/Globulin Ratio: 1 — ABNORMAL LOW (ref 1.2–2.2)
Albumin: 3.3 g/dL — ABNORMAL LOW (ref 3.8–4.8)
Alkaline Phosphatase: 137 IU/L — ABNORMAL HIGH (ref 44–121)
BUN/Creatinine Ratio: 13 (ref 10–24)
BUN: 9 mg/dL (ref 8–27)
Bilirubin Total: 0.4 mg/dL (ref 0.0–1.2)
CO2: 23 mmol/L (ref 20–29)
Calcium: 8.6 mg/dL (ref 8.6–10.2)
Chloride: 97 mmol/L (ref 96–106)
Creatinine, Ser: 0.69 mg/dL — ABNORMAL LOW (ref 0.76–1.27)
GFR calc Af Amer: 115 mL/min/{1.73_m2} (ref >59)
GFR calc non Af Amer: 100 mL/min/{1.73_m2} (ref >59)
Globulin, Total: 3.2 g/dL (ref 1.5–4.5)
Glucose: 231 mg/dL — ABNORMAL HIGH (ref 65–99)
Potassium: 4.3 mmol/L (ref 3.5–5.2)
Sodium: 135 mmol/L (ref 134–144)
Total Protein: 6.5 g/dL (ref 6.0–8.5)

## 2020-05-07 LAB — LIPID PANEL
Chol/HDL Ratio: 2.6 ratio (ref 0.0–5.0)
Cholesterol, Total: 99 mg/dL — ABNORMAL LOW (ref 100–199)
HDL: 38 mg/dL — ABNORMAL LOW (ref >39)
LDL Chol Calc (NIH): 42 mg/dL (ref 0–99)
Triglycerides: 99 mg/dL (ref 0–149)
VLDL Cholesterol Cal: 19 mg/dL (ref 5–40)

## 2020-05-09 ENCOUNTER — Encounter: Payer: Self-pay | Admitting: Physical Therapy

## 2020-05-09 ENCOUNTER — Other Ambulatory Visit: Payer: Self-pay

## 2020-05-09 ENCOUNTER — Ambulatory Visit: Payer: Medicare Other | Attending: Family Medicine | Admitting: Physical Therapy

## 2020-05-09 DIAGNOSIS — M6281 Muscle weakness (generalized): Secondary | ICD-10-CM | POA: Insufficient documentation

## 2020-05-09 DIAGNOSIS — R262 Difficulty in walking, not elsewhere classified: Secondary | ICD-10-CM | POA: Insufficient documentation

## 2020-05-09 DIAGNOSIS — R2681 Unsteadiness on feet: Secondary | ICD-10-CM | POA: Insufficient documentation

## 2020-05-09 NOTE — Therapy (Signed)
[facility] [address] [place], [place], [address] Phone: [phone]   Fax:  [phone]  Physical Therapy Evaluation  Patient Details  Name: [name]. MRN: [id number] Date of Birth: 1956-02-18 Referring Provider (PT): Arville Care, MD    Encounter Date: 05/09/2020   PT End of Session - 05/09/20 1544    Visit Number 1    Number of Visits 18    Date for PT Re-Evaluation 07/11/20    Authorization Type Medicare (CQ modifier, KX modifer) Progress note every 10th visit    PT Start Time 0815    PT Stop Time 0903    PT Time Calculation (min) 48 min    Equipment Utilized During Treatment Other (comment);Gait belt   rolling walker, WC   Activity Tolerance Patient tolerated treatment well    Behavior During Therapy WFL for tasks assessed/performed           Past Medical History:  Diagnosis Date  . Cataract   . Diabetes mellitus without complication (HCC)   . Neuropathy     Past Surgical History:  Procedure Laterality Date  . ANKLE SURGERY Right   . BACK SURGERY    . EYE SURGERY     cateracts  . GASTROPLASTY  1978  . TOTAL HIP ARTHROPLASTY Bilateral     There were no vitals filed for this visit.    Subjective Assessment - 05/09/20 1536    Subjective COVID-19 screening performed upon arrival. Patient arrives to physical therapy with reports of bilateral LE weakness, difficulty walking, and deconditioning secondary to COVID-19 infection on 04/08/2020. Patient reported being in the hospital ICU for 7 days. Patient reported only walking for physical therapy then was put back to bed secondary to drops in SaO2. Patient reports gaining assistance for ADLs from wife and family. Patient reports only tolerating about 70 feet of walking with rollator before feeling SOB and fatigue. Patient's wife also reports a loss of balance requiring for her to supervise to prevent falls. Patient reports utilizing oxygen at night and when  exerting himself. Patient has had 3 falls prior to COVID-19 hospitalization. Patient's goals are to improve movement, improve strength, improve balance. improve standing and walking tolerance, improve ability to perform home and work activities.    Patient is accompained by: Family member   Wife, [name]   Pertinent History HTN, DM, neuropathy, history of COVID-19, history of back surgery, ankle surgery, and bilateral THA    Limitations House hold activities;Walking;Standing    How long can you stand comfortably? short periods    How long can you walk comfortably? about 70 feet    Patient Stated Goals get back to normal routine, walk without walker or AD    Currently in Pain? No/denies              [facility] PT Assessment - 05/09/20 0001      Assessment   Medical Diagnosis Pneumona due to COVID-19 virus, Weakness    Referring Provider (PT) Arville Care, MD    Onset Date/Surgical Date 04/08/20    Next MD Visit 05/20/2020    Prior Therapy no      Precautions   Precautions Fall      Restrictions   Weight Bearing Restrictions No      Balance Screen   Has the patient fallen in the past 6 months Yes    How many times? 3    Has the patient had a decrease in activity level because of a  fear of falling?  Yes    Is the patient reluctant to leave their home because of a fear of falling?  No      Home Environment   Living Environment Private residence    Living Arrangements Spouse/significant other;Children    Type of [place] entrance      Prior Function   Level of [place] device for independence;Needs assistance with ADLs;Needs assistance with transfers;Needs assistance with gait      Observation/Other Assessments   Observations intermittent resting tremor in bilateral UEs      ROM / Strength   AROM / PROM / Strength Strength      Strength   Overall Strength Comments hip abd and add performed in sitting    Strength Assessment Site  Hip;Knee    Right/Left Hip Right;Left    Right Hip Flexion 3-/5    Right Hip ABduction 3+/5    Right Hip ADduction 3+/5    Left Hip Extension 3-/5    Left Hip ABduction 3+/5    Left Hip ADduction 3+/5    Right/Left Knee Right;Left    Right Knee Flexion 4-/5    Right Knee Extension 4/5    Left Knee Flexion 4-/5    Left Knee Extension 4/5      Transfers   Transfers Sit to Stand;Stand to Sit;Stand Pivot Transfers    Sit to Stand 4: Min assist    Stand to Sit 4: Min Social research officer, government Transfers 4: Min assist      Ambulation/Gait   Ambulation/Gait Yes    Ambulation/Gait Assistance 4: Min guard    Ambulation Distance (Feet) 29 Feet    Assistive device Rolling walker    Gait Pattern Step-to pattern;Decreased stride length;Decreased step length - left;Antalgic;Trunk flexed    Gait Comments arrived negotiating in [place] pushed by wife                      Objective measurements completed on examination: See above findings.               PT Education - 05/09/20 1543    Education Details seated alternate UE/LE marching, seated marching, LAQ, hee/toe raises, glute sets, walking with rollator, importance of preventing over fatigue    Person(s) Educated Patient;Spouse    Methods Explanation;Demonstration;Handout    Comprehension Verbalized understanding               PT Long Term Goals - 05/09/20 1727      PT LONG TERM GOAL #1   Title Patient will be independent with HEP and its progression    Time 8    Period Weeks    Status New      PT LONG TERM GOAL #2   Title Patient will demonstrate 4/5 bilateral LE MMT to improve stability during functional tasks.    Time 8    Period Weeks    Status New      PT LONG TERM GOAL #3   Title Patient will ambulate with LRAD for 300 feet or greater with minimal gait deviations to safely negotiate around the community and for work activities    Time 8    Period Weeks    Status New      PT LONG TERM GOAL #4    Title Patient will perform nustep for 15 mins with SaO2 maintained above 95% and SPM over 60 to improve cardiovascular health.  Time 8    Period Weeks    Status New                  Plan - 05/09/20 1755    Clinical Impression Statement Patient is a 65 year old male who presents to physical therapy with his wife with difficulty walking and bilateral LE weakness. Patient requires min A for sit<>stand transfers and stand pivot transfers. Patient negotiated in clinic in wheelchair propelled by his wife. Gait assessed with contact guard and WC follow notable step to pattern, decreased bilateral stride lengths, flexed trunk, and decreased gait velocity. Patient's SaO2 maintained at 96% but heart rate increased significantly after 29' walk. Patient, patient's wife and PT discussed at length plan of care, frequent vital assessment throughout session, and importance of not over exerting the body during therapy. PT suggested patient of buying a portable pulse oximeter for home to assess vitals. Patient reported understanding and agreement. Patient would benefit from skilled physical therapy to address deficits and patient's goals.    Personal Factors and Comorbidities Comorbidity 3+    Comorbidities HTN, DM, neuropathy, history of COVID-19, history of back surgery, ankle surgery, and bilateral THA    Examination-Activity Limitations Locomotion Level;Transfers;Stairs;Stand    Examination-Participation Restrictions Occupation    Stability/Clinical Decision Making Evolving/Moderate complexity    Clinical Decision Making Moderate    Rehab Potential Good    PT Frequency 2x / week    PT Duration 8 weeks    PT Treatment/Interventions ADLs/Self Care Home Management;Gait training;Stair training;Functional mobility training;Therapeutic activities;Therapeutic exercise;Balance training;Manual techniques;Energy conservation;Passive range of motion;Patient/family education    PT Next Visit Plan nustep to  tolerance, low level strengthening, gait training, frequently assess vitals    PT Home Exercise Plan see patient education section    Consulted and Agree with Plan of Care Patient           Patient will benefit from skilled therapeutic intervention in order to improve the following deficits and impairments:  Abnormal gait,Difficulty walking,Decreased strength,Decreased mobility,Decreased balance,Decreased activity tolerance,Cardiopulmonary status limiting activity,Decreased endurance  Visit Diagnosis: Muscle weakness (generalized) - Plan: PT plan of care cert/re-cert  Difficulty in walking, not elsewhere classified - Plan: PT plan of care cert/re-cert  Unsteadiness on feet - Plan: PT plan of care cert/re-cert     Problem List Patient Active Problem List   Diagnosis Date Noted  . Hyperlipidemia associated with type 2 diabetes mellitus ([place]) 02/01/2020  . Hypertension associated with diabetes ([place]) 11/21/2018  . Framingham cardiac risk >20% in next 10 years 11/21/2018  . Type 2 diabetes mellitus with neurological complications ([place]) 99/24/2683    Gabriela Eves, PT, DPT 05/09/2020, 6:07 PM  [facility] [address] [place], [place], [address] Phone: [phone]   Fax:  [phone]  Name: [name]. MRN: [id number] Date of Birth: [birth year]

## 2020-05-10 ENCOUNTER — Ambulatory Visit: Payer: Self-pay | Admitting: Physical Therapy

## 2020-05-15 ENCOUNTER — Ambulatory Visit: Payer: Medicare Other | Admitting: Physical Therapy

## 2020-05-17 ENCOUNTER — Encounter: Payer: Medicare Other | Admitting: Physical Therapy

## 2020-05-20 ENCOUNTER — Ambulatory Visit: Payer: Medicare Other | Admitting: Family Medicine

## 2020-05-24 ENCOUNTER — Encounter: Payer: Self-pay | Admitting: Family Medicine

## 2020-06-10 ENCOUNTER — Other Ambulatory Visit: Payer: Self-pay | Admitting: Family Medicine

## 2020-07-11 ENCOUNTER — Ambulatory Visit: Payer: Medicare Other | Admitting: Family Medicine

## 2020-07-15 ENCOUNTER — Encounter: Payer: Self-pay | Admitting: Family Medicine

## 2020-07-15 ENCOUNTER — Ambulatory Visit (INDEPENDENT_AMBULATORY_CARE_PROVIDER_SITE_OTHER): Payer: Medicare Other | Admitting: Family Medicine

## 2020-07-15 ENCOUNTER — Other Ambulatory Visit: Payer: Self-pay | Admitting: Family Medicine

## 2020-07-15 ENCOUNTER — Other Ambulatory Visit: Payer: Self-pay

## 2020-07-15 VITALS — BP 111/76 | HR 82 | Ht 75.0 in | Wt 284.0 lb

## 2020-07-15 DIAGNOSIS — E1169 Type 2 diabetes mellitus with other specified complication: Secondary | ICD-10-CM | POA: Diagnosis not present

## 2020-07-15 DIAGNOSIS — E1149 Type 2 diabetes mellitus with other diabetic neurological complication: Secondary | ICD-10-CM

## 2020-07-15 DIAGNOSIS — I152 Hypertension secondary to endocrine disorders: Secondary | ICD-10-CM

## 2020-07-15 DIAGNOSIS — E785 Hyperlipidemia, unspecified: Secondary | ICD-10-CM

## 2020-07-15 DIAGNOSIS — E1159 Type 2 diabetes mellitus with other circulatory complications: Secondary | ICD-10-CM

## 2020-07-15 LAB — BAYER DCA HB A1C WAIVED: HB A1C (BAYER DCA - WAIVED): 5.9 % (ref <7.0)

## 2020-07-15 NOTE — Progress Notes (Signed)
BP 111/76   Pulse 82   Ht 6\' 3"  (1.905 m)   Wt 284 lb (128.8 kg)   SpO2 96%   BMI 35.50 kg/m    Subjective:   Patient ID: [name]., male    DOB: March 06, 1956, 65 y.o.   MRN: [id number]  HPI: Zohaib Heeney. is a 65 y.o. male presenting on 07/15/2020 for Medical Management of Chronic Issues and Diabetes   HPI Type 2 diabetes mellitus Patient comes in today for recheck of his diabetes. Patient has been currently taking Trulicity and Basaglar and Metformin and glipizide. Patient is currently on an ACE inhibitor/ARB. Patient has not seen an ophthalmologist this year. Patient denies any issues with their feet. The symptom started onset as an adult hypertension and hyperlipidemia neuropathy ARE RELATED TO DM   Hypertension Patient is currently on olmesartan, and their blood pressure today is 111/76. Patient denies any lightheadedness or dizziness. Patient denies headaches, blurred vision, chest pains, shortness of breath, or weakness. Denies any side effects from medication and is content with current medication.   Hyperlipidemia Patient is coming in for recheck of his hyperlipidemia. The patient is currently taking atorvastatin. They deny any issues with myalgias or history of liver damage from it. They deny any focal numbness or weakness or chest pain.   Patient is still having a lot of equilibrium problems.  Relevant past medical, surgical, family and social history reviewed and updated as indicated. Interim medical history since our last visit reviewed. Allergies and medications reviewed and updated.  Review of Systems  Constitutional: Negative for chills and fever.  Eyes: Negative for visual disturbance.  Respiratory: Negative for shortness of breath and wheezing.   Cardiovascular: Negative for chest pain and leg swelling.  Musculoskeletal: Negative for back pain and gait problem.  Skin: Negative for rash.  Neurological: Positive for dizziness (Still has some  equilibrium issues after Covid.). Negative for weakness, light-headedness and numbness.  All other systems reviewed and are negative.   Per HPI unless specifically indicated above   Allergies as of 07/15/2020      Reactions   Bee Venom       Medication List       Accurate as of July 15, 2020  2:16 PM. If you have any questions, ask your nurse or doctor.        aspirin EC 81 MG tablet Take 81 mg by mouth daily. Swallow whole.   atorvastatin 20 MG tablet Commonly known as: LIPITOR Take 1 tablet (20 mg total) by mouth daily.   Basaglar KwikPen 100 UNIT/ML Inject 25-35 Units into the skin daily.   gabapentin 400 MG capsule Commonly known as: NEURONTIN Take 2 capsules (800 mg total) by mouth 2 (two) times daily.   GNP UltiCare Pen Needles 32G X 6 MM Misc Generic drug: Insulin Pen Needle EVERY DAY   ibuprofen 600 MG tablet Commonly known as: ADVIL Take 1 tablet (600 mg total) by mouth every 8 (eight) hours as needed.   metFORMIN 500 MG 24 hr tablet Commonly known as: GLUCOPHAGE-XR TAKE TWO TABLETS BY MOUTH TWICE DAILY   nitroGLYCERIN 0.4 MG SL tablet Commonly known as: NITROSTAT Place 0.4 mg under the tongue every 5 (five) minutes as needed for chest pain.   olmesartan 20 MG tablet Commonly known as: BENICAR Take 1 tablet (20 mg total) by mouth daily.   predniSONE 20 MG tablet Commonly known as: DELTASONE 2 po at same time daily for 5 days  Trulicity 1.5 ZO/1.0RU Sopn Generic drug: Dulaglutide Inject 1.5 mg into the skin once a week.        Objective:   BP 111/76   Pulse 82   Ht 6\' 3"  (1.905 m)   Wt 284 lb (128.8 kg)   SpO2 96%   BMI 35.50 kg/m   Wt Readings from Last 3 Encounters:  07/15/20 284 lb (128.8 kg)  05/06/20 266 lb (120.7 kg)  02/01/20 285 lb (129.3 kg)    Physical Exam Vitals and nursing note reviewed.  Constitutional:      General: He is not in acute distress.    Appearance: He is well-developed. He is not diaphoretic.  Eyes:      General: No scleral icterus.    Conjunctiva/sclera: Conjunctivae normal.  Neck:     Thyroid: No thyromegaly.  Cardiovascular:     Rate and Rhythm: Normal rate and regular rhythm.     Heart sounds: Normal heart sounds. No murmur heard.   Pulmonary:     Effort: Pulmonary effort is normal. No respiratory distress.     Breath sounds: Normal breath sounds. No wheezing.  Musculoskeletal:        General: Normal range of motion.     Cervical back: Neck supple.  Lymphadenopathy:     Cervical: No cervical adenopathy.  Skin:    General: Skin is warm and dry.     Findings: No rash.  Neurological:     Mental Status: He is alert and oriented to person, place, and time.     Coordination: Coordination normal.  Psychiatric:        Behavior: Behavior normal.       Assessment & Plan:   Problem List Items Addressed This Visit      Cardiovascular and Mediastinum   Hypertension associated with diabetes ([place])     Endocrine   Type 2 diabetes mellitus with neurological complications ([place]) - Primary   Relevant Orders   Bayer DCA Hb A1c Waived   Hyperlipidemia associated with type 2 diabetes mellitus ([place])      Will stop glipizide because his A1c is 5.9 and I am worried that is having low blood sugars, continue the other medicine.  No other changes.  No other change medication. Follow up plan: Return in about 3 months (around 10/15/2020), or if symptoms worsen or fail to improve, for Diabetes and hypertension recheck.  Counseling provided for all of the vaccine components Orders Placed This Encounter  Procedures  . Bayer [facility] Hb A1c [place], MD [place] [facility] 07/15/2020, 2:16 PM

## 2020-07-16 ENCOUNTER — Encounter: Payer: Self-pay | Admitting: Family Medicine

## 2020-08-15 ENCOUNTER — Other Ambulatory Visit: Payer: Self-pay | Admitting: Family Medicine

## 2020-09-08 ENCOUNTER — Other Ambulatory Visit: Payer: Self-pay | Admitting: Family Medicine

## 2020-09-08 DIAGNOSIS — E1149 Type 2 diabetes mellitus with other diabetic neurological complication: Secondary | ICD-10-CM

## 2020-10-09 ENCOUNTER — Other Ambulatory Visit: Payer: Self-pay | Admitting: Family Medicine

## 2020-10-09 DIAGNOSIS — Z9189 Other specified personal risk factors, not elsewhere classified: Secondary | ICD-10-CM

## 2020-10-09 DIAGNOSIS — I152 Hypertension secondary to endocrine disorders: Secondary | ICD-10-CM

## 2020-10-09 DIAGNOSIS — E1149 Type 2 diabetes mellitus with other diabetic neurological complication: Secondary | ICD-10-CM

## 2020-10-15 ENCOUNTER — Ambulatory Visit: Payer: Medicare Other | Admitting: Family Medicine

## 2020-10-22 ENCOUNTER — Other Ambulatory Visit: Payer: Self-pay | Admitting: Family Medicine

## 2020-10-22 ENCOUNTER — Encounter: Payer: Self-pay | Admitting: Family Medicine

## 2020-10-22 ENCOUNTER — Ambulatory Visit (INDEPENDENT_AMBULATORY_CARE_PROVIDER_SITE_OTHER): Payer: Medicare Other | Admitting: Family Medicine

## 2020-10-22 DIAGNOSIS — B9689 Other specified bacterial agents as the cause of diseases classified elsewhere: Secondary | ICD-10-CM

## 2020-10-22 DIAGNOSIS — J988 Other specified respiratory disorders: Secondary | ICD-10-CM

## 2020-10-22 MED ORDER — BENZONATATE 100 MG PO CAPS: 100.0000 mg | ORAL_CAPSULE | Freq: Three times a day (TID) | ORAL | 0 refills | Status: DC | PRN

## 2020-10-22 MED ORDER — AMOXICILLIN-POT CLAVULANATE 875-125 MG PO TABS
1.0000 | ORAL_TABLET | Freq: Two times a day (BID) | ORAL | 0 refills | Status: AC
Start: 2020-10-22 — End: 2020-10-29

## 2020-10-22 MED ORDER — METHYLPREDNISOLONE 4 MG PO TBPK: ORAL_TABLET | ORAL | 0 refills | Status: DC

## 2020-10-22 MED ORDER — ALBUTEROL SULFATE HFA 108 (90 BASE) MCG/ACT IN AERS
2.0000 | INHALATION_SPRAY | Freq: Four times a day (QID) | RESPIRATORY_TRACT | 2 refills | Status: DC | PRN
Start: 2020-10-22 — End: 2021-04-04

## 2020-10-22 NOTE — Progress Notes (Signed)
Virtual Visit via Telephone Note  I connected with [name]. on 10/22/20 at 3:34 PM by telephone and verified that I am speaking with the correct person using two identifiers. [name]. is currently located at home and nobody is currently with him during this visit. The provider, Loman Brooklyn, FNP is located in their home at time of visit.  I discussed the limitations, risks, security and privacy concerns of performing an evaluation and management service by telephone and the availability of in person appointments. I also discussed with the patient that there may be a patient responsible charge related to this service. The patient expressed understanding and agreed to proceed.  Subjective: PCP: Dettinger, Fransisca Kaufmann, MD  Chief Complaint  Patient presents with   URI   Patient complains of head/chest congestion and fever. Additional symptoms include cough, headache, runny nose, sneezing, shortness of breath, and wheezing. Onset of symptoms was several days ago, gradually worsening since that time. He is drinking plenty of fluids. Evaluation to date: at home COVID test was negative. Treatment to date: none. He does not smoke.    ROS: Per HPI  Current Outpatient Medications:    aspirin EC 81 MG tablet, Take 81 mg by mouth daily. Swallow whole., Disp: , Rfl:    atorvastatin (LIPITOR) 20 MG tablet, TAKE ONE (1) TABLET EACH DAY, Disp: 90 tablet, Rfl: 0   Dulaglutide (TRULICITY) 1.5 XK/4.8JE SOPN, Inject 1.5 mg into the skin once a week., Disp: 3 mL, Rfl: 3   gabapentin (NEURONTIN) 400 MG capsule, TAKE TWO CAPSULES BY MOUTH TWICE DAILY., Disp: 360 capsule, Rfl: 0   GNP ULTICARE PEN NEEDLES 32G X 6 MM MISC, EVERY DAY, Disp: 100 each, Rfl: 2   ibuprofen (ADVIL) 600 MG tablet, Take 1 tablet (600 mg total) by mouth every 8 (eight) hours as needed., Disp: 90 tablet, Rfl: 3   Insulin Glargine (BASAGLAR KWIKPEN) 100 UNIT/ML, Inject 25-35 Units into the skin daily., Disp: 15 mL,  Rfl: 3   metFORMIN (GLUCOPHAGE-XR) 500 MG 24 hr tablet, TAKE TWO TABLETS BY MOUTH TWICE DAILY, Disp: 120 tablet, Rfl: 0   nitroGLYCERIN (NITROSTAT) 0.4 MG SL tablet, Place 0.4 mg under the tongue every 5 (five) minutes as needed for chest pain., Disp: , Rfl:    olmesartan (BENICAR) 20 MG tablet, Take 1 tablet (20 mg total) by mouth daily., Disp: 90 tablet, Rfl: 3  Allergies  Allergen Reactions   Bee Venom    Past Medical History:  Diagnosis Date   Cataract    Diabetes mellitus without complication ([place])    Neuropathy     Observations/Objective: A&O  No respiratory distress or wheezing audible over the phone Mood, judgement, and thought processes all WNL  Assessment and Plan: 1. Bacterial respiratory infection Discussed symptom management.  Patient declined COVID testing.  He has had COVID twice previously. - amoxicillin-clavulanate (AUGMENTIN) 875-125 MG tablet; Take 1 tablet by mouth 2 (two) times daily for 7 days.  Dispense: 14 tablet; Refill: 0 - methylPREDNISolone (MEDROL DOSEPAK) 4 MG TBPK tablet; Use as directed.  Dispense: 21 each; Refill: 0 - albuterol (VENTOLIN HFA) 108 (90 Base) MCG/ACT inhaler; Inhale 2 puffs into the lungs every 6 (six) hours as needed for wheezing or shortness of breath.  Dispense: 18 g; Refill: 2 - benzonatate (TESSALON PERLES) 100 MG capsule; Take 1 capsule (100 mg total) by mouth 3 (three) times daily as needed for cough.  Dispense: 30 capsule; Refill: 0   Follow  Up Instructions:  I discussed the assessment and treatment plan with the patient. The patient was provided an opportunity to ask questions and all were answered. The patient agreed with the plan and demonstrated an understanding of the instructions.   The patient was advised to call back or seek an in-person evaluation if the symptoms worsen or if the condition fails to improve as anticipated.  The above assessment and management plan was discussed with the patient. The patient verbalized  understanding of and has agreed to the management plan. Patient is aware to call the clinic if symptoms persist or worsen. Patient is aware when to return to the clinic for a follow-up visit. Patient educated on when it is appropriate to go to the emergency department.   Time call ended: 3:45 PM  I provided 11 minutes of non-face-to-face time during this encounter.  Hendricks Limes, MSN, APRN, FNP-C [facility] 10/22/20

## 2020-10-23 NOTE — Telephone Encounter (Signed)
100 mg unavailable; changed to 200 mg

## 2020-10-24 ENCOUNTER — Other Ambulatory Visit: Payer: Self-pay | Admitting: Family Medicine

## 2020-10-24 DIAGNOSIS — J988 Other specified respiratory disorders: Secondary | ICD-10-CM

## 2020-10-24 NOTE — Telephone Encounter (Signed)
Ok just send to different pharmacy, ask where they want it sent

## 2020-10-25 NOTE — Telephone Encounter (Signed)
Left message for patient to return call with another pharmacy we can send medication to.

## 2020-10-25 NOTE — Telephone Encounter (Signed)
Waiting on patient to return call to let us know another pharmacy he would like this prescription sent to.

## 2020-10-29 ENCOUNTER — Other Ambulatory Visit: Payer: Self-pay | Admitting: Family Medicine

## 2020-10-29 DIAGNOSIS — J988 Other specified respiratory disorders: Secondary | ICD-10-CM

## 2020-11-02 ENCOUNTER — Other Ambulatory Visit: Payer: Self-pay | Admitting: Family Medicine

## 2020-11-06 ENCOUNTER — Other Ambulatory Visit: Payer: Self-pay

## 2020-11-06 ENCOUNTER — Encounter: Payer: Self-pay | Admitting: Family Medicine

## 2020-11-06 ENCOUNTER — Ambulatory Visit (INDEPENDENT_AMBULATORY_CARE_PROVIDER_SITE_OTHER): Payer: Medicare Other | Admitting: Family Medicine

## 2020-11-06 VITALS — BP 94/61 | HR 68 | Wt 273.0 lb

## 2020-11-06 DIAGNOSIS — E1159 Type 2 diabetes mellitus with other circulatory complications: Secondary | ICD-10-CM | POA: Diagnosis not present

## 2020-11-06 DIAGNOSIS — Z9189 Other specified personal risk factors, not elsewhere classified: Secondary | ICD-10-CM | POA: Diagnosis not present

## 2020-11-06 DIAGNOSIS — E785 Hyperlipidemia, unspecified: Secondary | ICD-10-CM

## 2020-11-06 DIAGNOSIS — E1169 Type 2 diabetes mellitus with other specified complication: Secondary | ICD-10-CM

## 2020-11-06 DIAGNOSIS — Z23 Encounter for immunization: Secondary | ICD-10-CM

## 2020-11-06 DIAGNOSIS — E1149 Type 2 diabetes mellitus with other diabetic neurological complication: Secondary | ICD-10-CM

## 2020-11-06 DIAGNOSIS — I152 Hypertension secondary to endocrine disorders: Secondary | ICD-10-CM

## 2020-11-06 LAB — BAYER DCA HB A1C WAIVED: HB A1C (BAYER DCA - WAIVED): 7.7 % — ABNORMAL HIGH (ref <7.0)

## 2020-11-06 MED ORDER — METFORMIN HCL ER 500 MG PO TB24: 1000.0000 mg | ORAL_TABLET | Freq: Two times a day (BID) | ORAL | 3 refills | Status: DC

## 2020-11-06 MED ORDER — TRULICITY 3 MG/0.5ML ~~LOC~~ SOAJ: 3.0000 mg | SUBCUTANEOUS | 3 refills | Status: DC

## 2020-11-06 MED ORDER — BASAGLAR KWIKPEN 100 UNIT/ML ~~LOC~~ SOPN: 34.0000 [IU] | PEN_INJECTOR | Freq: Every day | SUBCUTANEOUS | 3 refills | Status: DC

## 2020-11-06 MED ORDER — ATORVASTATIN CALCIUM 20 MG PO TABS: 20.0000 mg | ORAL_TABLET | Freq: Every day | ORAL | 3 refills | Status: DC

## 2020-11-06 MED ORDER — OLMESARTAN MEDOXOMIL 5 MG PO TABS: 5.0000 mg | ORAL_TABLET | Freq: Every day | ORAL | 3 refills | Status: DC

## 2020-11-06 MED ORDER — GABAPENTIN 400 MG PO CAPS: 800.0000 mg | ORAL_CAPSULE | Freq: Two times a day (BID) | ORAL | 3 refills | Status: DC

## 2020-11-06 MED ORDER — DULOXETINE HCL 30 MG PO CPEP: 30.0000 mg | ORAL_CAPSULE | Freq: Every day | ORAL | 3 refills | Status: DC

## 2020-11-06 NOTE — Progress Notes (Signed)
BP 94/61   Pulse 68   Wt 273 lb (123.8 kg)   SpO2 94%   BMI 34.12 kg/m    Subjective:   Patient ID: [name]., male    DOB: 1955/06/11, 65 y.o.   MRN: [id number]  HPI: [name]. is a 65 y.o. male presenting on 11/06/2020 for Medical Management of Chronic Issues, Diabetes, Hyperlipidemia, and Hypertension   HPI Type 2 diabetes mellitus Patient comes in today for recheck of his diabetes. Patient has been currently taking Basaglar and Trulicity and metformin. Patient is currently on an ACE inhibitor/ARB. Patient has not seen an ophthalmologist this year. Patient has new burning and neuropathy but denies any sores or cuts.  The symptom started onset as an adult neuropathy and hypertension and hyperlipidemia ARE RELATED TO DM.  Patient's neuropathy has been worsening and he stopped out of the gabapentin and does not want to stop it but will see if he can do something else.  Patient's blood sugars are running up just slightly.  He is currently on 34 units of Basaglar and 1.5 Trulicity and metformin.  A1c 7.7 today  Hyperlipidemia Patient is coming in for recheck of his hyperlipidemia. The patient is currently taking atorvastatin. They deny any issues with myalgias or history of liver damage from it. They deny any focal numbness or weakness or chest pain.   Hypertension Patient is currently on olmesartan, and their blood pressure today is 94/61.  Patient has occasional lightheadedness and some dizziness. Patient denies headaches, blurred vision, chest pains, shortness of breath, or weakness. Denies any side effects from medication and is content with current medication.   Relevant past medical, surgical, family and social history reviewed and updated as indicated. Interim medical history since our last visit reviewed. Allergies and medications reviewed and updated.  Review of Systems  Constitutional:  Negative for chills and fever.  Eyes:  Negative for visual disturbance.   Respiratory:  Negative for shortness of breath and wheezing.   Cardiovascular:  Negative for chest pain and leg swelling.  Musculoskeletal:  Negative for back pain and gait problem.  Skin:  Negative for rash.  Neurological:  Positive for dizziness and light-headedness.  All other systems reviewed and are negative.  Per HPI unless specifically indicated above   Allergies as of 11/06/2020       Reactions   Bee Venom         Medication List        Accurate as of November 06, 2020  2:24 PM. If you have any questions, ask your nurse or doctor.          STOP taking these medications    Trulicity 1.5 WU/9.8JX Sopn Generic drug: Dulaglutide Replaced by: Trulicity 3 BJ/4.7WG Sopn Stopped by: Fransisca Kaufmann Jocee Kissick, MD       TAKE these medications    albuterol 108 (90 Base) MCG/ACT inhaler Commonly known as: VENTOLIN HFA Inhale 2 puffs into the lungs every 6 (six) hours as needed for wheezing or shortness of breath.   aspirin EC 81 MG tablet Take 81 mg by mouth daily. Swallow whole.   atorvastatin 20 MG tablet Commonly known as: LIPITOR Take 1 tablet (20 mg total) by mouth daily. What changed: See the new instructions. Changed by: Fransisca Kaufmann Devanie Galanti, MD   Basaglar KwikPen 100 UNIT/ML Inject 34 Units into the skin daily. What changed: how much to take Changed by: Worthy Rancher, MD   benzonatate 100 MG capsule Commonly  known as: TESSALON Take 2 capsules (200 mg total) by mouth 3 (three) times daily as needed for cough.   DULoxetine 30 MG capsule Commonly known as: Cymbalta Take 1 capsule (30 mg total) by mouth at bedtime. Started by: Worthy Rancher, MD   gabapentin 400 MG capsule Commonly known as: NEURONTIN Take 2 capsules (800 mg total) by mouth 2 (two) times daily.   GNP UltiCare Pen Needles 32G X 6 MM Misc Generic drug: Insulin Pen Needle EVERY DAY   ibuprofen 600 MG tablet Commonly known as: ADVIL Take 1 tablet (600 mg total) by mouth every 8  (eight) hours as needed.   metFORMIN 500 MG 24 hr tablet Commonly known as: GLUCOPHAGE-XR Take 2 tablets (1,000 mg total) by mouth 2 (two) times daily. What changed: additional instructions Changed by: Fransisca Kaufmann Velecia Ovitt, MD   methylPREDNISolone 4 MG Tbpk tablet Commonly known as: MEDROL DOSEPAK Use as directed.   nitroGLYCERIN 0.4 MG SL tablet Commonly known as: NITROSTAT Place 0.4 mg under the tongue every 5 (five) minutes as needed for chest pain.   olmesartan 5 MG tablet Commonly known as: Benicar Take 1 tablet (5 mg total) by mouth daily. What changed:  medication strength how much to take Changed by: Worthy Rancher, MD   Trulicity 3 ON/6.2XB Sopn Generic drug: Dulaglutide Inject 3 mg as directed once a week. Replaces: Trulicity 1.5 MW/4.1LK Sopn Started by: Fransisca Kaufmann Alahia Whicker, MD         Objective:   BP 94/61   Pulse 68   Wt 273 lb (123.8 kg)   SpO2 94%   BMI 34.12 kg/m   Wt Readings from Last 3 Encounters:  11/06/20 273 lb (123.8 kg)  07/15/20 284 lb (128.8 kg)  05/06/20 266 lb (120.7 kg)    Physical Exam Vitals and nursing note reviewed.  Constitutional:      General: He is not in acute distress.    Appearance: He is well-developed. He is not diaphoretic.  Eyes:     General: No scleral icterus.    Conjunctiva/sclera: Conjunctivae normal.  Neck:     Thyroid: No thyromegaly.  Cardiovascular:     Rate and Rhythm: Normal rate and regular rhythm.     Heart sounds: Normal heart sounds. No murmur heard. Pulmonary:     Effort: Pulmonary effort is normal. No respiratory distress.     Breath sounds: Normal breath sounds. No wheezing.  Musculoskeletal:        General: Normal range of motion.     Cervical back: Neck supple.  Lymphadenopathy:     Cervical: No cervical adenopathy.  Skin:    General: Skin is warm and dry.     Findings: No rash.  Neurological:     Mental Status: He is alert and oriented to person, place, and time.      Coordination: Coordination normal.  Psychiatric:        Behavior: Behavior normal.      Assessment & Plan:   Problem List Items Addressed This Visit       Cardiovascular and Mediastinum   Hypertension associated with diabetes ([place])   Relevant Medications   atorvastatin (LIPITOR) 20 MG tablet   Insulin Glargine (BASAGLAR KWIKPEN) 100 UNIT/ML   metFORMIN (GLUCOPHAGE-XR) 500 MG 24 hr tablet   Dulaglutide (TRULICITY) 3 GM/0.1UU SOPN   olmesartan (BENICAR) 5 MG tablet   Other Relevant Orders   CBC with Differential/Platelet   CMP14+EGFR   Lipid panel   Bayer DCA Hb  A1c Waived     Endocrine   Type 2 diabetes mellitus with neurological complications (HCC) - Primary   Relevant Medications   atorvastatin (LIPITOR) 20 MG tablet   gabapentin (NEURONTIN) 400 MG capsule   Insulin Glargine (BASAGLAR KWIKPEN) 100 UNIT/ML   metFORMIN (GLUCOPHAGE-XR) 500 MG 24 hr tablet   Dulaglutide (TRULICITY) 3 ER/1.5QM SOPN   olmesartan (BENICAR) 5 MG tablet   Other Relevant Orders   CBC with Differential/Platelet   CMP14+EGFR   Lipid panel   Bayer DCA Hb A1c Waived   Hyperlipidemia associated with type 2 diabetes mellitus (HCC)   Relevant Medications   atorvastatin (LIPITOR) 20 MG tablet   Insulin Glargine (BASAGLAR KWIKPEN) 100 UNIT/ML   metFORMIN (GLUCOPHAGE-XR) 500 MG 24 hr tablet   Dulaglutide (TRULICITY) 3 GQ/6.7YP SOPN   olmesartan (BENICAR) 5 MG tablet   Other Relevant Orders   CBC with Differential/Platelet   CMP14+EGFR   Lipid panel   Bayer DCA Hb A1c Waived     Other   [place] cardiac risk >20% in next 10 years   Relevant Medications   atorvastatin (LIPITOR) 20 MG tablet    Increase Trulicity to 3 mg and will send to [facility].  We will decrease olmesartan to 5 mg because of low blood pressures.  A1c slightly up at 7.7 and hopefully the Trulicity increase will cover that.  No other changes in medication. Follow up plan: Return in about 3 months (around 02/06/2021), or if  symptoms worsen or fail to improve, for Diabetes and hypertension and cholesterol recheck.  Counseling provided for all of the vaccine components Orders Placed This Encounter  Procedures   CBC with Differential/Platelet   CMP14+EGFR   Lipid panel   Bayer DCA Hb A1c Waived    Caryl Pina, MD [facility] 11/06/2020, 2:24 PM

## 2020-11-06 NOTE — Addendum Note (Signed)
Addended by: Alphonzo Dublin on: 11/06/2020 02:49 PM   Modules accepted: Orders

## 2020-11-07 LAB — CMP14+EGFR
ALT: 7 IU/L (ref 0–44)
AST: 8 IU/L (ref 0–40)
Albumin/Globulin Ratio: 1.5 (ref 1.2–2.2)
Albumin: 3.8 g/dL (ref 3.8–4.8)
Alkaline Phosphatase: 93 IU/L (ref 44–121)
BUN/Creatinine Ratio: 19 (ref 10–24)
BUN: 17 mg/dL (ref 8–27)
Bilirubin Total: 0.4 mg/dL (ref 0.0–1.2)
CO2: 21 mmol/L (ref 20–29)
Calcium: 8.9 mg/dL (ref 8.6–10.2)
Chloride: 103 mmol/L (ref 96–106)
Creatinine, Ser: 0.91 mg/dL (ref 0.76–1.27)
Globulin, Total: 2.5 g/dL (ref 1.5–4.5)
Glucose: 153 mg/dL — ABNORMAL HIGH (ref 65–99)
Potassium: 4.8 mmol/L (ref 3.5–5.2)
Sodium: 142 mmol/L (ref 134–144)
Total Protein: 6.3 g/dL (ref 6.0–8.5)
eGFR: 93 mL/min/1.73

## 2020-11-07 LAB — CBC WITH DIFFERENTIAL/PLATELET
Basophils Absolute: 0.1 10*3/uL (ref 0.0–0.2)
Basos: 1 %
EOS (ABSOLUTE): 0.2 10*3/uL (ref 0.0–0.4)
Eos: 2 %
Hematocrit: 36.4 % — ABNORMAL LOW (ref 37.5–51.0)
Hemoglobin: 12.3 g/dL — ABNORMAL LOW (ref 13.0–17.7)
Immature Grans (Abs): 0 10*3/uL (ref 0.0–0.1)
Immature Granulocytes: 1 %
Lymphocytes Absolute: 2.4 10*3/uL (ref 0.7–3.1)
Lymphs: 28 %
MCH: 28.3 pg (ref 26.6–33.0)
MCHC: 33.8 g/dL (ref 31.5–35.7)
MCV: 84 fL (ref 79–97)
Monocytes Absolute: 0.9 10*3/uL (ref 0.1–0.9)
Monocytes: 10 %
Neutrophils Absolute: 5.2 10*3/uL (ref 1.4–7.0)
Neutrophils: 58 %
Platelets: 244 10*3/uL (ref 150–450)
RBC: 4.35 x10E6/uL (ref 4.14–5.80)
RDW: 14.3 % (ref 11.6–15.4)
WBC: 8.8 10*3/uL (ref 3.4–10.8)

## 2020-11-07 LAB — LIPID PANEL
Chol/HDL Ratio: 2.2 ratio (ref 0.0–5.0)
Cholesterol, Total: 85 mg/dL — ABNORMAL LOW (ref 100–199)
HDL: 39 mg/dL — ABNORMAL LOW (ref >39)
LDL Chol Calc (NIH): 27 mg/dL (ref 0–99)
Triglycerides: 102 mg/dL (ref 0–149)
VLDL Cholesterol Cal: 19 mg/dL (ref 5–40)

## 2020-11-14 ENCOUNTER — Telehealth: Payer: Self-pay | Admitting: Family Medicine

## 2020-11-14 NOTE — Telephone Encounter (Cosign Needed)
Spoke with [place] over the phone to discuss his colorectal cancer screening. We discussed the recommendations for everyone 78 and older to be screened and went through the screening options. He said that he did one of the at home tests about 10 years ago. He would be willing to do another cologuard and would like one sent to him.

## 2020-11-22 ENCOUNTER — Telehealth: Payer: Self-pay | Admitting: Pharmacist

## 2020-11-22 NOTE — Telephone Encounter (Signed)
Re-faxed paper work for [employer] [facility] patient assistance program (free basaglar & trulicity '3mg'$ ) Patient made aware

## 2021-01-13 ENCOUNTER — Encounter: Payer: Self-pay | Admitting: Family Medicine

## 2021-01-13 ENCOUNTER — Ambulatory Visit (INDEPENDENT_AMBULATORY_CARE_PROVIDER_SITE_OTHER): Payer: Medicare Other | Admitting: Family Medicine

## 2021-01-13 ENCOUNTER — Other Ambulatory Visit: Payer: Self-pay

## 2021-01-13 VITALS — BP 138/85 | HR 76 | Ht 75.0 in | Wt 270.0 lb

## 2021-01-13 DIAGNOSIS — S161XXA Strain of muscle, fascia and tendon at neck level, initial encounter: Secondary | ICD-10-CM | POA: Diagnosis not present

## 2021-01-13 MED ORDER — BACLOFEN 10 MG PO TABS: 10.0000 mg | ORAL_TABLET | Freq: Every evening | ORAL | 1 refills | Status: DC | PRN

## 2021-01-13 NOTE — Progress Notes (Signed)
BP 138/85   Pulse 76   Ht '6\' 3"'$  (1.905 m)   Wt 270 lb (122.5 kg)   SpO2 96%   BMI 33.75 kg/m    Subjective:   Patient ID: Ethan Price., male    DOB: Apr 27, 1956, 65 y.o.   MRN: [id number]  HPI: Ethan Orejel. is a 65 y.o. male presenting on 01/13/2021 for Neck Pain (Radiates to left jaw)   HPI Neck pain Patient is coming in complaining left-sided neck pain and tension that leads to headaches on that side.  He says sometimes he will feel like a vibration or a spasming feeling on the left side of his neck coming up to the left side of face with a headache that comes across the top of his head.  He said sometimes he will feel little dizzy when he feels that as well.  He denies any shortness of breath or sweats or chest pain or chest pressure or chest tightness.  He says it does not happen all activity or rest but just happens randomly sometimes either.  He does have a cardiologist who is managing his blockages.  He says there is a spot on the left side of his neck where it is very tender and very tight.  He says this is been going off and on for 3 weeks and will happen for couple minutes and then passed and then happen for a couple minutes for about 3-4 times per day over the past 3 weeks.  Relevant past medical, surgical, family and social history reviewed and updated as indicated. Interim medical history since our last visit reviewed. Allergies and medications reviewed and updated.  Review of Systems  Constitutional:  Negative for chills, diaphoresis, fatigue and fever.  Respiratory:  Negative for chest tightness, shortness of breath and wheezing.   Cardiovascular:  Negative for chest pain, palpitations and leg swelling.  Endocrine: Negative for cold intolerance and heat intolerance.  Musculoskeletal:  Positive for arthralgias, myalgias and neck pain. Negative for back pain, gait problem and neck stiffness.  Skin:  Negative for rash.  All other systems reviewed and are  negative.  Per HPI unless specifically indicated above   Allergies as of 01/13/2021       Reactions   Bee Venom         Medication List        Accurate as of January 13, 2021  9:40 AM. If you have any questions, ask your nurse or doctor.          STOP taking these medications    methylPREDNISolone 4 MG Tbpk tablet Commonly known as: MEDROL DOSEPAK Stopped by: Fransisca Kaufmann Alroy Portela, MD       TAKE these medications    albuterol 108 (90 Base) MCG/ACT inhaler Commonly known as: VENTOLIN HFA Inhale 2 puffs into the lungs every 6 (six) hours as needed for wheezing or shortness of breath.   aspirin EC 81 MG tablet Take 81 mg by mouth daily. Swallow whole.   atorvastatin 20 MG tablet Commonly known as: LIPITOR Take 1 tablet (20 mg total) by mouth daily.   baclofen 10 MG tablet Commonly known as: LIORESAL Take 1 tablet (10 mg total) by mouth at bedtime as needed for muscle spasms. Started by: Worthy Rancher, MD   Basaglar KwikPen 100 UNIT/ML Inject 34 Units into the skin daily.   benzonatate 100 MG capsule Commonly known as: TESSALON Take 2 capsules (200 mg total) by mouth 3 (three)  times daily as needed for cough.   DULoxetine 30 MG capsule Commonly known as: Cymbalta Take 1 capsule (30 mg total) by mouth at bedtime.   gabapentin 400 MG capsule Commonly known as: NEURONTIN Take 2 capsules (800 mg total) by mouth 2 (two) times daily.   GNP UltiCare Pen Needles 32G X 6 MM Misc Generic drug: Insulin Pen Needle EVERY DAY   ibuprofen 600 MG tablet Commonly known as: ADVIL Take 1 tablet (600 mg total) by mouth every 8 (eight) hours as needed.   metFORMIN 500 MG 24 hr tablet Commonly known as: GLUCOPHAGE-XR Take 2 tablets (1,000 mg total) by mouth 2 (two) times daily.   nitroGLYCERIN 0.4 MG SL tablet Commonly known as: NITROSTAT Place 0.4 mg under the tongue every 5 (five) minutes as needed for chest pain.   olmesartan 5 MG tablet Commonly known  as: Benicar Take 1 tablet (5 mg total) by mouth daily.   Trulicity 3 0000000 Sopn Generic drug: Dulaglutide Inject 3 mg as directed once a week.         Objective:   BP 138/85   Pulse 76   Ht '6\' 3"'$  (1.905 m)   Wt 270 lb (122.5 kg)   SpO2 96%   BMI 33.75 kg/m   Wt Readings from Last 3 Encounters:  01/13/21 270 lb (122.5 kg)  11/06/20 273 lb (123.8 kg)  07/15/20 284 lb (128.8 kg)    Physical Exam Vitals and nursing note reviewed.  Constitutional:      General: He is not in acute distress.    Appearance: He is well-developed. He is not diaphoretic.  Eyes:     General: No scleral icterus.    Conjunctiva/sclera: Conjunctivae normal.  Neck:     Thyroid: No thyromegaly.  Cardiovascular:     Rate and Rhythm: Normal rate and regular rhythm.     Heart sounds: Normal heart sounds. No murmur heard. Pulmonary:     Effort: Pulmonary effort is normal. No respiratory distress.     Breath sounds: Normal breath sounds. No wheezing.  Musculoskeletal:        General: No swelling. Normal range of motion.     Cervical back: Neck supple. Tenderness present. No swelling, deformity, bony tenderness or crepitus. Pain with movement present. Normal range of motion.       Back:  Lymphadenopathy:     Cervical: No cervical adenopathy.  Skin:    General: Skin is warm and dry.     Findings: No rash.  Neurological:     Mental Status: He is alert and oriented to person, place, and time.     Coordination: Coordination normal.  Psychiatric:        Behavior: Behavior normal.      Assessment & Plan:   Problem List Items Addressed This Visit   None Visit Diagnoses     Neck strain, initial encounter    -  Primary   Relevant Medications   baclofen (LIORESAL) 10 MG tablet       Likely muscle sprain or strain, conservative management with stretching and exercise and heating pads and TENS units and muscle relaxer. Follow up plan: Return if symptoms worsen or fail to  improve.  Counseling provided for all of the vaccine components No orders of the defined types were placed in this encounter.   Caryl Pina, MD [facility] 01/13/2021, 9:40 AM

## 2021-01-27 ENCOUNTER — Other Ambulatory Visit: Payer: Self-pay | Admitting: Family Medicine

## 2021-01-27 DIAGNOSIS — S161XXA Strain of muscle, fascia and tendon at neck level, initial encounter: Secondary | ICD-10-CM

## 2021-01-30 ENCOUNTER — Telehealth: Payer: Self-pay | Admitting: Family Medicine

## 2021-02-06 ENCOUNTER — Encounter: Payer: Self-pay | Admitting: Family Medicine

## 2021-02-06 ENCOUNTER — Ambulatory Visit (INDEPENDENT_AMBULATORY_CARE_PROVIDER_SITE_OTHER): Payer: Medicare Other | Admitting: Family Medicine

## 2021-02-06 ENCOUNTER — Other Ambulatory Visit: Payer: Self-pay

## 2021-02-06 VITALS — BP 125/72 | HR 78 | Ht 75.0 in | Wt 262.0 lb

## 2021-02-06 DIAGNOSIS — I152 Hypertension secondary to endocrine disorders: Secondary | ICD-10-CM

## 2021-02-06 DIAGNOSIS — E1149 Type 2 diabetes mellitus with other diabetic neurological complication: Secondary | ICD-10-CM

## 2021-02-06 DIAGNOSIS — M5432 Sciatica, left side: Secondary | ICD-10-CM

## 2021-02-06 DIAGNOSIS — E1159 Type 2 diabetes mellitus with other circulatory complications: Secondary | ICD-10-CM | POA: Diagnosis not present

## 2021-02-06 DIAGNOSIS — E1169 Type 2 diabetes mellitus with other specified complication: Secondary | ICD-10-CM

## 2021-02-06 DIAGNOSIS — E785 Hyperlipidemia, unspecified: Secondary | ICD-10-CM

## 2021-02-06 LAB — BAYER DCA HB A1C WAIVED: HB A1C (BAYER DCA - WAIVED): 6.4 % — ABNORMAL HIGH (ref 4.8–5.6)

## 2021-02-06 MED ORDER — METHYLPREDNISOLONE ACETATE 80 MG/ML IJ SUSP
80.0000 mg | Freq: Once | INTRAMUSCULAR | Status: AC
  Administered 2021-02-06: 80 mg via INTRAMUSCULAR

## 2021-02-06 MED ORDER — DICLOFENAC SODIUM 75 MG PO TBEC: 75.0000 mg | DELAYED_RELEASE_TABLET | Freq: Two times a day (BID) | ORAL | 0 refills | Status: DC

## 2021-02-06 MED ORDER — BACLOFEN 10 MG PO TABS: 10.0000 mg | ORAL_TABLET | Freq: Three times a day (TID) | ORAL | 1 refills | Status: DC | PRN

## 2021-02-06 NOTE — Progress Notes (Signed)
BP 125/72   Pulse 78   Ht 6\' 3"  (1.905 m)   Wt 262 lb (118.8 kg)   SpO2 99%   BMI 32.75 kg/m    Subjective:   Patient ID: Ethan Price., male    DOB: [birth year], 65 y.o.   MRN: [id number]  HPI: Ethan Sweet. is a 65 y.o. male presenting on 02/06/2021 for Medical Management of Chronic Issues, Diabetes, and Back Pain (Left lower. Radiates down left leg)   HPI Type 2 diabetes mellitus Patient comes in today for recheck of his diabetes. Patient has been currently taking trulicity and metformin and basaglar. Patient is currently on an ACE inhibitor/ARB. Patient has seen an ophthalmologist this year. Patient denies any issues with their feet. The symptom started onset as an adult hypertension and hyperlipidemia and neuropathy ARE RELATED TO DM   Hypertension Patient is currently on Benicar, and their blood pressure today is 124/72. Patient denies any lightheadedness or dizziness. Patient denies headaches, blurred vision, chest pains, shortness of breath, or weakness. Denies any side effects from medication and is content with current medication.   Hyperlipidemia Patient is coming in for recheck of his hyperlipidemia. The patient is currently taking atorvastatin. They deny any issues with myalgias or history of liver damage from it. They deny any focal numbness or weakness or chest pain.   Back pain Patient is coming in with complaints of left lower back pain with sciatic pain going down his left leg that is been going on for just over a week.  He says it was getting better but then he had a fall yesterday and got worse again.  He says muscle relaxers and steroid were helping.  Relevant past medical, surgical, family and social history reviewed and updated as indicated. Interim medical history since our last visit reviewed. Allergies and medications reviewed and updated.  Review of Systems  Constitutional:  Negative for chills and fever.  Respiratory:  Negative for shortness  of breath and wheezing.   Cardiovascular:  Negative for chest pain and leg swelling.  Musculoskeletal:  Positive for arthralgias, back pain and myalgias. Negative for gait problem.  Skin:  Negative for rash.  Neurological:  Negative for tremors, speech difficulty, weakness and numbness.  All other systems reviewed and are negative.  Per HPI unless specifically indicated above   Allergies as of 02/06/2021       Reactions   Bee Venom         Medication List        Accurate as of February 06, 2021  2:13 PM. If you have any questions, ask your nurse or doctor.          albuterol 108 (90 Base) MCG/ACT inhaler Commonly known as: VENTOLIN HFA Inhale 2 puffs into the lungs every 6 (six) hours as needed for wheezing or shortness of breath.   aspirin EC 81 MG tablet Take 81 mg by mouth daily. Swallow whole.   atorvastatin 20 MG tablet Commonly known as: LIPITOR Take 1 tablet (20 mg total) by mouth daily.   baclofen 10 MG tablet Commonly known as: LIORESAL Take 1 tablet (10 mg total) by mouth 3 (three) times daily as needed for muscle spasms. What changed: See the new instructions. Changed by: Fransisca Kaufmann Saul Fabiano, MD   Basaglar KwikPen 100 UNIT/ML Inject 34 Units into the skin daily.   benzonatate 100 MG capsule Commonly known as: TESSALON Take 2 capsules (200 mg total) by mouth 3 (three) times daily  as needed for cough.   diclofenac 75 MG EC tablet Commonly known as: VOLTAREN Take 1 tablet (75 mg total) by mouth 2 (two) times daily. Started by: Worthy Rancher, MD   DULoxetine 30 MG capsule Commonly known as: Cymbalta Take 1 capsule (30 mg total) by mouth at bedtime.   gabapentin 400 MG capsule Commonly known as: NEURONTIN Take 2 capsules (800 mg total) by mouth 2 (two) times daily.   GNP UltiCare Pen Needles 32G X 6 MM Misc Generic drug: Insulin Pen Needle EVERY DAY   ibuprofen 600 MG tablet Commonly known as: ADVIL Take 1 tablet (600 mg total) by mouth  every 8 (eight) hours as needed.   metFORMIN 500 MG 24 hr tablet Commonly known as: GLUCOPHAGE-XR Take 2 tablets (1,000 mg total) by mouth 2 (two) times daily.   nitroGLYCERIN 0.4 MG SL tablet Commonly known as: NITROSTAT Place 0.4 mg under the tongue every 5 (five) minutes as needed for chest pain.   olmesartan 5 MG tablet Commonly known as: Benicar Take 1 tablet (5 mg total) by mouth daily.   Trulicity 3 OH/6.0VP Sopn Generic drug: Dulaglutide Inject 3 mg as directed once a week.         Objective:   BP 125/72   Pulse 78   Ht 6\' 3"  (1.905 m)   Wt 262 lb (118.8 kg)   SpO2 99%   BMI 32.75 kg/m   Wt Readings from Last 3 Encounters:  02/06/21 262 lb (118.8 kg)  01/13/21 270 lb (122.5 kg)  11/06/20 273 lb (123.8 kg)    Physical Exam Vitals and nursing note reviewed.  Constitutional:      General: He is not in acute distress.    Appearance: He is well-developed. He is not diaphoretic.  Eyes:     General: No scleral icterus.    Conjunctiva/sclera: Conjunctivae normal.  Neck:     Thyroid: No thyromegaly.  Cardiovascular:     Rate and Rhythm: Normal rate and regular rhythm.     Heart sounds: Normal heart sounds. No murmur heard. Pulmonary:     Effort: Pulmonary effort is normal. No respiratory distress.     Breath sounds: Normal breath sounds. No wheezing.  Musculoskeletal:        General: Normal range of motion.     Cervical back: Neck supple.     Lumbar back: Tenderness present. No bony tenderness. Negative right straight leg raise test and negative left straight leg raise test.       Back:  Lymphadenopathy:     Cervical: No cervical adenopathy.  Skin:    General: Skin is warm and dry.     Findings: No rash.  Neurological:     Mental Status: He is alert and oriented to person, place, and time.     Coordination: Coordination normal.  Psychiatric:        Behavior: Behavior normal.      Assessment & Plan:   Problem List Items Addressed This Visit        Cardiovascular and Mediastinum   Hypertension associated with diabetes ([place])     Endocrine   Type 2 diabetes mellitus with neurological complications ([place]) - Primary   Relevant Orders   Bayer DCA Hb A1c Waived   Hyperlipidemia associated with type 2 diabetes mellitus ([place])   Other Visit Diagnoses     Left sciatic nerve pain       Relevant Medications   baclofen (LIORESAL) 10 MG tablet   methylPREDNISolone  acetate (DEPO-MEDROL) injection 80 mg (Start on 02/06/2021  2:15 PM)   diclofenac (VOLTAREN) 75 MG EC tablet       A1c looks great at 6.4, continue current medicine.  For his back pain give him a muscle relaxer and an anti-inflammatory and a steroid injection, if this is not getting better then recommended we go to physical therapy Follow up plan: Return in about 3 months (around 05/09/2021), or if symptoms worsen or fail to improve, for Diabetes hypertension cholesterol.  Counseling provided for all of the vaccine components Orders Placed This Encounter  Procedures   Bayer [place] Hb A1c [place], MD [facility] 02/06/2021, 2:13 PM

## 2021-02-13 ENCOUNTER — Telehealth: Payer: Self-pay | Admitting: Family Medicine

## 2021-02-13 NOTE — Telephone Encounter (Signed)
Pt's wife wants to talk to Dettinger's nurse about her husband. No other information given. Please call back.

## 2021-02-13 NOTE — Telephone Encounter (Signed)
Per wife pt had injection last week for back pain after a fall. Pt's left side was hurting at the time and now the right side is hurting. Pt would like to have another injection.

## 2021-02-14 NOTE — Telephone Encounter (Signed)
It is too soon for another injection, looks like he does have baclofen and Voltaren, I would recommend to use those and if he wants to do something else for it we could do a referral to physical therapy to have them look at it.

## 2021-02-14 NOTE — Telephone Encounter (Signed)
Pt does not want referral to PT at this time. He is trying to get in touch with the surgeon that operated on hips first. Pt will call back if needed.

## 2021-03-05 ENCOUNTER — Ambulatory Visit: Payer: Self-pay

## 2021-03-05 ENCOUNTER — Encounter: Payer: Self-pay | Admitting: Orthopaedic Surgery

## 2021-03-05 ENCOUNTER — Ambulatory Visit (INDEPENDENT_AMBULATORY_CARE_PROVIDER_SITE_OTHER): Payer: Medicare Other | Admitting: Orthopaedic Surgery

## 2021-03-05 ENCOUNTER — Other Ambulatory Visit: Payer: Self-pay

## 2021-03-05 DIAGNOSIS — M5441 Lumbago with sciatica, right side: Secondary | ICD-10-CM | POA: Diagnosis not present

## 2021-03-05 DIAGNOSIS — M79604 Pain in right leg: Secondary | ICD-10-CM

## 2021-03-05 DIAGNOSIS — M5442 Lumbago with sciatica, left side: Secondary | ICD-10-CM | POA: Diagnosis not present

## 2021-03-05 DIAGNOSIS — M545 Low back pain, unspecified: Secondary | ICD-10-CM | POA: Insufficient documentation

## 2021-03-05 NOTE — Progress Notes (Signed)
Office Visit Note   Patient: [name].           Date of Birth: 1955/07/22           MRN: [id number] Visit Date: 03/05/2021              Requested by: Dettinger, Fransisca Kaufmann, MD [place],  [place] [id number] PCP: Dettinger, Fransisca Kaufmann, MD   Assessment & Plan: Visit Diagnoses:  1. Pain in right leg   2. Acute right-sided low back pain with right-sided sciatica   3. Acute bilateral low back pain with bilateral sciatica     Plan: [name] is an old patient with bilateral total hip replacements performed in 2009 and 2010.  He relates not having any problem with either of his hips.  He is an insulin-dependent diabetic and also was on Trulicity and metformin he relates he has diabetic neuropathy in both of his feet.  He has had an issue with balance since he developed COVID about a year ago but even more importantly a problem with his gait and pain in his back in both eyes after 2 falls within the past 3 weeks.  The first fall occurred when his dog knocked him down the second was when he lost his balance at church.  Since that time he has had difficulty bearing weight because of pain in his back referred pain to both thighs.  He feels like he is weak but neurologically he has excellent strength.  He does have some neuropathy in both of his feet related to the diabetes with decreased sensation.  His hip does not appear to be fine both by film as well as by testing.  I think the problem is with his back.  He has uncovered L4-5 and L5-S1 levels as his fusion is from L1-L4.  We will obtain an MRI scan.  He is on Cymbalta and gabapentin  Follow-Up Instructions: Return After MRI scan lumbar spine.   Orders:  Orders Placed This Encounter  Procedures   XR Lumbar Spine 2-3 Views   XR Pelvis 1-2 Views   MR Lumbar Spine w/o contrast   No orders of the defined types were placed in this encounter.     Procedures: No procedures performed   Clinical Data: No additional  findings.   Subjective: Chief Complaint  Patient presents with   Right Hip - Pain  Patient presents today for right hip pain. He said that he fell three weeks ago and then again a week later. He has been having pain in his lower back, buttock, and down his right leg. He developed some groin pain yesterday. He cannot bear weight due to pain. He is currently in a wheelchair due to the pain. His pain is worse if he straightens up while standing. He has a history of bilateral total hip replacements in '09 and '10. His PCP gave him Baclofen and Voltaren. He also has a mild stroke last week.  Had a CT scan at a local hospital in [place] that was "clear".  He has had some problems with pain in both thighs and to some extent weakness since he had COVID over a year ago.  He also is an insulin-dependent diabetic also taking metformin and Trulicity.  He has developed some neuropathy in both of his feet.  He has difficulty bearing any weight at the present time and since he had his fall about 3 weeks ago as when he does stand he  will has back pain and will be referred to both of his thighs.  HPI  Review of Systems   Objective: Vital Signs: There were no vitals taken for this visit.  Physical Exam Constitutional:      Appearance: He is well-developed.  Pulmonary:     Effort: Pulmonary effort is normal.  Skin:    General: Skin is warm and dry.  Neurological:     Mental Status: He is alert and oriented to person, place, and time.  Psychiatric:        Behavior: Behavior normal.    Ortho Exam awake alert and oriented x3.  Evaluated in a wheelchair.  I thought he had excellent strength of both lower extremities.  Painless range of motion of both hips.  He does have some decreased sensibility in both of his feet on a chronic basis related to his diabetic neuropathy.  He had good pulses but his feet were just a little bit cool.  No calf pain.  Maybe minimal swelling of both of his ankles.  No pain with  range of motion or palpation of either knee.  He did have a mild area of tenderness in the right paralumbar region straight leg raise was negative.  He was able to actively extend both of his legs.  His speech was normal.  Specialty Comments:  No specialty comments available.  Imaging: XR Lumbar Spine 2-3 Views  Result Date: 03/05/2021 AP and lateral views of the lumbar spine were obtained demonstrating a prior fusion from L1-L4 with pedicle screws.  No complications with the hardware.  There is narrowing of the L4-5 and particularly the L5-S1 disc space.  No acute changes.  There is complete collapse of the L3-4 disc space.  No prior films for comparison.  XR Pelvis 1-2 Views  Result Date: 03/05/2021 AP the pelvis demonstrates intact bilateral total hip replacements.  There is a vertical position of the left acetabulum but no obvious wear and the patient is asymptomatic on both sides.  Excellent position on the right without obvious hardware complication    PMFS History: Patient Active Problem List   Diagnosis Date Noted   Low back pain 03/05/2021   Hyperlipidemia associated with type 2 diabetes mellitus ([place]) 02/01/2020   Hypertension associated with diabetes ([place]) 11/21/2018   [place] cardiac risk >20% in next 10 years 11/21/2018   Type 2 diabetes mellitus with neurological complications ([place]) 96/75/9163   Past Medical History:  Diagnosis Date   Cataract    Diabetes mellitus without complication ([place])    Neuropathy     Family History  Problem Relation Age of Onset   Diabetes Mother    Heart disease Mother    Heart disease Father    Diabetes Father     Past Surgical History:  Procedure Laterality Date   ANKLE SURGERY Right    BACK SURGERY     EYE SURGERY     cateracts   GASTROPLASTY  1978   TOTAL HIP ARTHROPLASTY Bilateral    Social History   Occupational History   Not on file  Tobacco Use   Smoking status: Never   Smokeless tobacco: Never  Substance and  Sexual Activity   Alcohol use: Never   Drug use: Never   Sexual activity: Yes    Comment: married for 2 years, widowed twice, 10 children

## 2021-03-07 ENCOUNTER — Other Ambulatory Visit: Payer: Self-pay

## 2021-03-07 ENCOUNTER — Telehealth: Payer: Self-pay

## 2021-03-07 ENCOUNTER — Ambulatory Visit
Admission: RE | Admit: 2021-03-07 | Discharge: 2021-03-07 | Disposition: A | Discharge status: Home / Self Care | Payer: Medicare Other | Source: Ambulatory Visit | Attending: Orthopaedic Surgery | Admitting: Orthopaedic Surgery

## 2021-03-07 DIAGNOSIS — M79604 Pain in right leg: Secondary | ICD-10-CM

## 2021-03-07 DIAGNOSIS — M5441 Lumbago with sciatica, right side: Secondary | ICD-10-CM

## 2021-03-07 NOTE — Telephone Encounter (Signed)
MRI Lumbar results are available in patient's chart.

## 2021-03-11 ENCOUNTER — Encounter: Payer: Self-pay | Admitting: Orthopaedic Surgery

## 2021-03-11 ENCOUNTER — Other Ambulatory Visit: Payer: Self-pay

## 2021-03-11 ENCOUNTER — Ambulatory Visit (INDEPENDENT_AMBULATORY_CARE_PROVIDER_SITE_OTHER): Payer: Medicare Other | Admitting: Orthopaedic Surgery

## 2021-03-11 ENCOUNTER — Ambulatory Visit (INDEPENDENT_AMBULATORY_CARE_PROVIDER_SITE_OTHER): Payer: Medicare Other

## 2021-03-11 DIAGNOSIS — M5442 Lumbago with sciatica, left side: Secondary | ICD-10-CM | POA: Diagnosis not present

## 2021-03-11 DIAGNOSIS — M79661 Pain in right lower leg: Secondary | ICD-10-CM | POA: Diagnosis not present

## 2021-03-11 DIAGNOSIS — M79604 Pain in right leg: Secondary | ICD-10-CM | POA: Insufficient documentation

## 2021-03-11 DIAGNOSIS — R937 Abnormal findings on diagnostic imaging of other parts of musculoskeletal system: Secondary | ICD-10-CM | POA: Diagnosis not present

## 2021-03-11 DIAGNOSIS — M5441 Lumbago with sciatica, right side: Secondary | ICD-10-CM

## 2021-03-11 NOTE — Progress Notes (Signed)
Office Visit Note   Patient: [name].           Date of Birth: [birth year]           MRN: [id number] Visit Date: 03/11/2021              Requested by: Dettinger, Fransisca Kaufmann, MD [place],  [place] [id number] PCP: Dettinger, Fransisca Kaufmann, MD   Assessment & Plan: Visit Diagnoses:  1. Pain in right lower leg   2. Acute bilateral low back pain with bilateral sciatica   3. Pain in right leg   4. Abnormal findings on diagnostic imaging of other parts of musculoskeletal system      Plan: MRI scan of the lumbar spine reveals a pathologic fracture of the L4 vertebral body with up to 40% of the vertebral body height loss centrally.  There was diffuse marrow replacement throughout the vertebral body with appearance concerning for metastatic disease or myeloma.  Lymphoma could also have this appearance.  There was also progression of degenerative changes at L4-5 with mild to moderate canal stenosis and moderate to severe bilateral foraminal stenosis.  Prior posterior spinal fusion at L1-L4 with no canal stenosis at the operative levels but mild residual bilateral foraminal stenosis at L3-4.  Predominant pain is in the parasacral region and in the right leg.  He was complaining of pain along the distal third of the right tibia and fibula.  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  Long discussion regarding the x-ray findings.  I would not be surprised if he is experiencing claudication in his right leg from the spinal stenosis but I am more concerned about the vertebral body collapse and marrow changes in the L4 vertebral body.  I discussed this with him as well.  We will asked radiology to perform a needle biopsy of the L4 vertebral body and obtain a CBC, c-Met, PSA and a serum protein electrophoresis.  He will hold on an epidural steroid and till the above evaluation is complete  Follow-Up Instructions: Return After  radiologic L4 vertebral body biopsy.   Orders:  Orders Placed This Encounter  Procedures   XR Tibia/Fibula Right   CBC with Differential/Platelet   Comprehensive Metabolic Panel (CMET)   PSA   Protein Electrophoresis, (serum)   No orders of the defined types were placed in this encounter.     Procedures: No procedures performed   Clinical Data: No additional findings.   Subjective: Chief Complaint  Patient presents with   Lower Back - Follow-up    MRI review  Patient presents today for follow up on his lower back. He had an MRI and is here today for those results.  No change in symptoms.  Having predominately paralumbar and right parasacral pain with discomfort in his right lower extremity which prevents him from walking.  He has a difficult time "straightening up" when he walks related to his back.  Had a normal CBC in July.  He also relates that his recent hemoglobin A1c was 6.  HPI  Review of Systems   Objective: Vital Signs: There were no vitals taken for this visit.  Physical Exam Constitutional:      Appearance: He is well-developed.  Pulmonary:     Effort: Pulmonary effort is normal.  Skin:    General: Skin is warm and dry.  Neurological:     Mental Status: He is  alert and oriented to person, place, and time.  Psychiatric:        Behavior: Behavior normal.    Ortho Exam awake alert and oriented x3.  Again, evaluated in the wheelchair.  Speech normal.  Affect normal.  Did have some areas of tenderness along the distal third of his right leg about the tibia and fibula but no skin changes.  X-rays were negative.  Does have altered sensibility in both feet related to his diabetic neuropathy but motor exam appeared to be fine.  Painless range of motion both hips.  Still has some percussible tenderness in the right parasacral and lumbar region.  No midline percussible tenderness.  No pain along the lateral aspect of his hip  Specialty Comments:  No specialty  comments available.  Imaging: XR Tibia/Fibula Right  Result Date: 03/11/2021 AP and lateral of the right fibula and tibia were obtained.  I did not see any bony lesions where the patient was symptomatic in the lower third of the leg.  There are some degenerative changes in the ankle joint from prior fracture and surgery.  There is a single screw in the distal tibia.  Ankle mortise is intact but with some irregularity of the joint surface consistent with probable traumatic arthritis    PMFS History: Patient Active Problem List   Diagnosis Date Noted   Pain in right leg 03/11/2021   Low back pain 03/05/2021   Hyperlipidemia associated with type 2 diabetes mellitus ([place]) 02/01/2020   Hypertension associated with diabetes ([place]) 11/21/2018   [place] cardiac risk >20% in next 10 years 11/21/2018   Type 2 diabetes mellitus with neurological complications ([place]) 79/06/8331   Past Medical History:  Diagnosis Date   Cataract    Diabetes mellitus without complication ([place])    Neuropathy     Family History  Problem Relation Age of Onset   Diabetes Mother    Heart disease Mother    Heart disease Father    Diabetes Father     Past Surgical History:  Procedure Laterality Date   ANKLE SURGERY Right    BACK SURGERY     EYE SURGERY     cateracts   GASTROPLASTY  1978   TOTAL HIP ARTHROPLASTY Bilateral    Social History   Occupational History   Not on file  Tobacco Use   Smoking status: Never   Smokeless tobacco: Never  Substance and Sexual Activity   Alcohol use: Never   Drug use: Never   Sexual activity: Yes    Comment: married for 2 years, widowed twice, 10 children

## 2021-03-11 NOTE — Telephone Encounter (Signed)
thanks

## 2021-03-11 NOTE — Addendum Note (Signed)
Addended by: Lendon Collar on: 03/11/2021 03:31 PM   Modules accepted: Orders

## 2021-03-13 ENCOUNTER — Encounter: Payer: Self-pay | Admitting: Family Medicine

## 2021-03-13 ENCOUNTER — Other Ambulatory Visit: Payer: Self-pay

## 2021-03-13 ENCOUNTER — Ambulatory Visit (INDEPENDENT_AMBULATORY_CARE_PROVIDER_SITE_OTHER): Payer: Medicare Other | Admitting: Family Medicine

## 2021-03-13 VITALS — BP 134/88 | HR 80 | Ht 75.0 in

## 2021-03-13 DIAGNOSIS — R404 Transient alteration of awareness: Secondary | ICD-10-CM | POA: Diagnosis not present

## 2021-03-13 DIAGNOSIS — R569 Unspecified convulsions: Secondary | ICD-10-CM | POA: Diagnosis not present

## 2021-03-13 DIAGNOSIS — S32040D Wedge compression fracture of fourth lumbar vertebra, subsequent encounter for fracture with routine healing: Secondary | ICD-10-CM | POA: Diagnosis not present

## 2021-03-13 DIAGNOSIS — Z23 Encounter for immunization: Secondary | ICD-10-CM | POA: Diagnosis not present

## 2021-03-13 DIAGNOSIS — G459 Transient cerebral ischemic attack, unspecified: Secondary | ICD-10-CM

## 2021-03-13 MED ORDER — METHYLPREDNISOLONE ACETATE 80 MG/ML IJ SUSP
80.0000 mg | Freq: Once | INTRAMUSCULAR | Status: AC
Start: 2021-03-13 — End: 2021-03-13
  Administered 2021-03-13: 11:00:00 80 mg via INTRAMUSCULAR
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
## 2021-03-14 LAB — CBC WITH DIFFERENTIAL/PLATELET
Absolute Monocytes: 939 cells/uL (ref 200–950)
Basophils Absolute: 75 cells/uL (ref 0–200)
Basophils Absolute: 0.1 10*3/uL (ref 0.0–0.2)
Basophils Relative: 0.5 %
Basos: 1 %
EOS (ABSOLUTE): 0.3 10*3/uL (ref 0.0–0.4)
Eos: 2 %
Eosinophils Absolute: 194 cells/uL (ref 15–500)
Eosinophils Relative: 1.3 %
HCT: 40 % (ref 38.5–50.0)
Hematocrit: 36.1 % — ABNORMAL LOW (ref 37.5–51.0)
Hemoglobin: 13.2 g/dL (ref 13.2–17.1)
Hemoglobin: 12.6 g/dL — ABNORMAL LOW (ref 13.0–17.7)
Immature Grans (Abs): 0.1 10*3/uL (ref 0.0–0.1)
Immature Granulocytes: 1 %
Lymphocytes Absolute: 2.1 10*3/uL (ref 0.7–3.1)
Lymphs Abs: 2101 cells/uL (ref 850–3900)
Lymphs: 20 %
MCH: 29 pg (ref 27.0–33.0)
MCH: 29.2 pg (ref 26.6–33.0)
MCHC: 33 g/dL (ref 32.0–36.0)
MCHC: 34.9 g/dL (ref 31.5–35.7)
MCV: 87.9 fL (ref 80.0–100.0)
MCV: 84 fL (ref 79–97)
MPV: 11.1 fL (ref 7.5–12.5)
Monocytes Absolute: 0.9 10*3/uL (ref 0.1–0.9)
Monocytes Relative: 6.3 %
Monocytes: 9 %
Neutro Abs: 11592 cells/uL — ABNORMAL HIGH (ref 1500–7800)
Neutrophils Absolute: 6.8 10*3/uL (ref 1.4–7.0)
Neutrophils Relative %: 77.8 %
Neutrophils: 67 %
Platelets: 248 10*3/uL (ref 140–400)
Platelets: 248 10*3/uL (ref 150–450)
RBC: 4.55 10*6/uL (ref 4.20–5.80)
RBC: 4.32 x10E6/uL (ref 4.14–5.80)
RDW: 13.8 % (ref 11.0–15.0)
RDW: 13.1 % (ref 11.6–15.4)
Total Lymphocyte: 14.1 %
WBC: 14.9 10*3/uL — ABNORMAL HIGH (ref 3.8–10.8)
WBC: 10.2 10*3/uL (ref 3.4–10.8)

## 2021-03-14 LAB — COMPREHENSIVE METABOLIC PANEL
AG Ratio: 1.5 (calc) (ref 1.0–2.5)
ALT: 7 U/L — ABNORMAL LOW (ref 9–46)
AST: 10 U/L (ref 10–35)
Albumin: 3.4 g/dL — ABNORMAL LOW (ref 3.6–5.1)
Alkaline phosphatase (APISO): 129 U/L (ref 35–144)
BUN: 12 mg/dL (ref 7–25)
CO2: 24 mmol/L (ref 20–32)
Calcium: 8.6 mg/dL (ref 8.6–10.3)
Chloride: 106 mmol/L (ref 98–110)
Creat: 0.75 mg/dL (ref 0.70–1.35)
Globulin: 2.3 g/dL (calc) (ref 1.9–3.7)
Glucose, Bld: 130 mg/dL — ABNORMAL HIGH (ref 65–99)
Potassium: 4.4 mmol/L (ref 3.5–5.3)
Sodium: 142 mmol/L (ref 135–146)
Total Bilirubin: 0.4 mg/dL (ref 0.2–1.2)
Total Protein: 5.7 g/dL — ABNORMAL LOW (ref 6.1–8.1)

## 2021-03-14 LAB — PROTEIN ELECTROPHORESIS, SERUM
Albumin ELP: 3.2 g/dL — ABNORMAL LOW (ref 3.8–4.8)
Alpha 1: 0.4 g/dL — ABNORMAL HIGH (ref 0.2–0.3)
Alpha 2: 0.8 g/dL (ref 0.5–0.9)
Beta 2: 0.4 g/dL (ref 0.2–0.5)
Beta Globulin: 0.4 g/dL (ref 0.4–0.6)
Gamma Globulin: 0.7 g/dL — ABNORMAL LOW (ref 0.8–1.7)
Total Protein: 6 g/dL — ABNORMAL LOW (ref 6.1–8.1)

## 2021-03-14 LAB — CMP14+EGFR
ALT: 7 IU/L (ref 0–44)
AST: 9 IU/L (ref 0–40)
Albumin/Globulin Ratio: 1.5 (ref 1.2–2.2)
Albumin: 3.4 g/dL — ABNORMAL LOW (ref 3.8–4.8)
Alkaline Phosphatase: 139 IU/L — ABNORMAL HIGH (ref 44–121)
BUN/Creatinine Ratio: 15 (ref 10–24)
BUN: 13 mg/dL (ref 8–27)
Bilirubin Total: 0.3 mg/dL (ref 0.0–1.2)
CO2: 23 mmol/L (ref 20–29)
Calcium: 9 mg/dL (ref 8.6–10.2)
Chloride: 108 mmol/L — ABNORMAL HIGH (ref 96–106)
Creatinine, Ser: 0.89 mg/dL (ref 0.76–1.27)
Globulin, Total: 2.3 g/dL (ref 1.5–4.5)
Glucose: 79 mg/dL (ref 70–99)
Potassium: 4.9 mmol/L (ref 3.5–5.2)
Sodium: 147 mmol/L — ABNORMAL HIGH (ref 134–144)
Total Protein: 5.7 g/dL — ABNORMAL LOW (ref 6.0–8.5)
eGFR: 95 mL/min/{1.73_m2} (ref >59)

## 2021-03-14 LAB — PSA: PSA: 1.22 ng/mL (ref <=4.00)

## 2021-03-17 ENCOUNTER — Telehealth: Payer: Self-pay | Admitting: Orthopaedic Surgery

## 2021-03-17 NOTE — Telephone Encounter (Signed)
Pt's wife Anderson Malta called requesting a call back concerning husband appt for biopsy procedure. Please call [phone]

## 2021-03-17 NOTE — Telephone Encounter (Signed)
Called patient-he has not heard from radiology re the vertebral body biopsy-please check-also add culture to whatever specimen they retrieve

## 2021-03-19 NOTE — Telephone Encounter (Signed)
Refaxed order to interventional radiology requesting cultures.

## 2021-03-24 ENCOUNTER — Telehealth: Payer: Self-pay | Admitting: Orthopaedic Surgery

## 2021-03-24 NOTE — Telephone Encounter (Signed)
[phone] 

## 2021-03-24 NOTE — Telephone Encounter (Signed)
Pt called about getting a biopsy on his back? He states he is in a lot of pain   CB [phone]

## 2021-03-25 ENCOUNTER — Telehealth: Payer: Self-pay | Admitting: Orthopaedic Surgery

## 2021-03-25 NOTE — Telephone Encounter (Signed)
Pt called stating he spoke with [name] and was given the phone number to call and set appt for his referral for intervention radiology. Pt states he called and they stated they don't do that procedure at that facility and they do it at the hospital. Please call this patient with the correct information and phone number so he can call and set this appt. Pt phone number is [phone].

## 2021-03-25 NOTE — Telephone Encounter (Signed)
Called patient and gave them the number to contact IR so that hopefully that can get him scheduled.

## 2021-03-26 ENCOUNTER — Other Ambulatory Visit: Payer: Self-pay | Admitting: Radiology

## 2021-03-26 ENCOUNTER — Other Ambulatory Visit (HOSPITAL_COMMUNITY): Payer: Self-pay | Admitting: Orthopaedic Surgery

## 2021-03-26 ENCOUNTER — Other Ambulatory Visit (HOSPITAL_COMMUNITY): Payer: Self-pay | Admitting: Interventional Radiology

## 2021-03-26 DIAGNOSIS — M79661 Pain in right lower leg: Secondary | ICD-10-CM

## 2021-03-26 DIAGNOSIS — M5441 Lumbago with sciatica, right side: Secondary | ICD-10-CM

## 2021-03-26 DIAGNOSIS — M5442 Lumbago with sciatica, left side: Secondary | ICD-10-CM

## 2021-03-26 NOTE — Telephone Encounter (Signed)
I contacted pt and gave him the number for Interventional Radiology with [facility] of [phone]

## 2021-03-27 ENCOUNTER — Other Ambulatory Visit: Payer: Self-pay

## 2021-03-27 ENCOUNTER — Encounter (HOSPITAL_COMMUNITY): Payer: Self-pay

## 2021-03-27 ENCOUNTER — Ambulatory Visit (HOSPITAL_COMMUNITY)
Admission: RE | Admit: 2021-03-27 | Discharge: 2021-03-27 | Disposition: A | Discharge status: Home / Self Care | Payer: Medicare Other | Source: Ambulatory Visit | Attending: Interventional Radiology | Admitting: Interventional Radiology

## 2021-03-27 DIAGNOSIS — M5442 Lumbago with sciatica, left side: Secondary | ICD-10-CM | POA: Insufficient documentation

## 2021-03-27 DIAGNOSIS — M79661 Pain in right lower leg: Secondary | ICD-10-CM | POA: Insufficient documentation

## 2021-03-27 DIAGNOSIS — E1136 Type 2 diabetes mellitus with diabetic cataract: Secondary | ICD-10-CM | POA: Insufficient documentation

## 2021-03-27 DIAGNOSIS — M5441 Lumbago with sciatica, right side: Secondary | ICD-10-CM | POA: Diagnosis present

## 2021-03-27 HISTORY — PX: IR FLUORO GUIDED NEEDLE PLC ASPIRATION/INJECTION LOC: IMG2395

## 2021-03-27 LAB — CBC
HCT: 39 % (ref 39.0–52.0)
Hemoglobin: 12.9 g/dL — ABNORMAL LOW (ref 13.0–17.0)
MCH: 29.7 pg (ref 26.0–34.0)
MCHC: 33.1 g/dL (ref 30.0–36.0)
MCV: 89.7 fL (ref 80.0–100.0)
Platelets: 247 10*3/uL (ref 150–400)
RBC: 4.35 MIL/uL (ref 4.22–5.81)
RDW: 14.9 % (ref 11.5–15.5)
WBC: 10.1 10*3/uL (ref 4.0–10.5)
nRBC: 0 % (ref 0.0–0.2)

## 2021-03-27 LAB — PROTIME-INR
INR: 0.9 (ref 0.8–1.2)
Prothrombin Time: 12.6 seconds (ref 11.4–15.2)

## 2021-03-27 LAB — GLUCOSE, CAPILLARY
Glucose-Capillary: 77 mg/dL (ref 70–99)
Glucose-Capillary: 62 mg/dL — ABNORMAL LOW (ref 70–99)
Glucose-Capillary: 94 mg/dL (ref 70–99)

## 2021-03-27 MED ORDER — ACETAMINOPHEN 325 MG PO TABS
ORAL_TABLET | ORAL | Status: AC
  Filled 2021-03-27: qty 2

## 2021-03-27 MED ORDER — SODIUM CHLORIDE 0.9 % IV SOLN: INTRAVENOUS | Status: DC

## 2021-03-27 MED ORDER — BUPIVACAINE HCL (PF) 0.5 % IJ SOLN
INTRAMUSCULAR | Status: AC | PRN
  Administered 2021-03-27: 20 mL

## 2021-03-27 MED ORDER — MIDAZOLAM HCL 2 MG/2ML IJ SOLN
INTRAMUSCULAR | Status: AC
  Filled 2021-03-27: qty 2

## 2021-03-27 MED ORDER — FENTANYL CITRATE (PF) 100 MCG/2ML IJ SOLN
INTRAMUSCULAR | Status: AC
  Filled 2021-03-27: qty 2

## 2021-03-27 MED ORDER — SODIUM CHLORIDE 0.9 % IV SOLN: INTRAVENOUS | Status: AC

## 2021-03-27 MED ORDER — FENTANYL CITRATE (PF) 100 MCG/2ML IJ SOLN
INTRAMUSCULAR | Status: AC | PRN
  Administered 2021-03-27: 25 ug via INTRAVENOUS

## 2021-03-27 MED ORDER — MIDAZOLAM HCL 2 MG/2ML IJ SOLN
INTRAMUSCULAR | Status: AC | PRN
  Administered 2021-03-27: 1 mg via INTRAVENOUS

## 2021-03-27 MED ORDER — BUPIVACAINE HCL (PF) 0.5 % IJ SOLN
INTRAMUSCULAR | Status: AC
  Filled 2021-03-27: qty 30

## 2021-03-27 MED ORDER — LIDOCAINE HCL 1 % IJ SOLN
INTRAMUSCULAR | Status: AC
  Filled 2021-03-27: qty 20

## 2021-03-27 MED ORDER — ACETAMINOPHEN 325 MG PO TABS
650.0000 mg | ORAL_TABLET | Freq: Once | ORAL | Status: AC
  Administered 2021-03-27: 650 mg via ORAL

## 2021-03-27 NOTE — H&P (Addendum)
Chief Complaint: Patient was seen in consultation today for Lumbar 4 lesion biopsy at the request of Dr Cordella Register   Supervising Physician: Luanne Bras  Patient Status: [facility] - Out-[facility]  History of Present Illness: [name]. is a 65 y.o. male   Pt has had Bilat Hip replacement 2009/2010 Follows with Dr Durward Fortes Has noticed worsening pain in Rt hip and down leg for over 1 mo Now to where he must use wheel chair Has had 2 falls in last month or so MR 03/07/21:   IMPRESSION: 1. Pathologic fracture of the L4 vertebral body with up to 40% vertebral body height loss centrally. Diffuse marrow replacement throughout the vertebral body with appearance concerning for metastatic disease or myeloma. Lymphoma could also have this appearance. Further workup including hematologic testing is recommended. Nuclear medicine whole-body bone scan could be considered to assess for additional sites of bony involvement. 2. Progression of degenerative changes at L4-L5 with mild-to-moderate canal stenosis and moderate-severe bilateral foraminal stenosis. 3. Prior posterior spinal fusion at L1 through L4. No canal stenosis at the operative levels. Mild residual bilateral foraminal stenosis at L3-L4.  Scheduled now for biopsy of L4 lesion per Dr Durward Fortes Dr Estanislado Pandy has seen imaging and approves procedure  Past Medical History:  Diagnosis Date   Cataract    Diabetes mellitus without complication ([place])    Neuropathy     Past Surgical History:  Procedure Laterality Date   ANKLE SURGERY Right    BACK SURGERY     EYE SURGERY     cateracts   GASTROPLASTY  1978   TOTAL HIP ARTHROPLASTY Bilateral     Allergies: Bee venom  Medications: Prior to Admission medications   Medication Sig Start Date End Date Taking? Authorizing Provider  atorvastatin (LIPITOR) 20 MG tablet Take 1 tablet (20 mg total) by mouth daily. 11/06/20  Yes Dettinger, Fransisca Kaufmann, MD  baclofen (LIORESAL) 10  MG tablet Take 1 tablet (10 mg total) by mouth 3 (three) times daily as needed for muscle spasms. 02/06/21  Yes Dettinger, Fransisca Kaufmann, MD  Dulaglutide (TRULICITY) 3 ST/4.1DQ SOPN Inject 3 mg as directed once a week. 11/06/20  Yes Dettinger, Fransisca Kaufmann, MD  DULoxetine (CYMBALTA) 30 MG capsule Take 1 capsule (30 mg total) by mouth at bedtime. 11/06/20  Yes Dettinger, Fransisca Kaufmann, MD  gabapentin (NEURONTIN) 400 MG capsule Take 2 capsules (800 mg total) by mouth 2 (two) times daily. 11/06/20  Yes Dettinger, Fransisca Kaufmann, MD  Insulin Glargine (BASAGLAR KWIKPEN) 100 UNIT/ML Inject 34 Units into the skin daily. 11/06/20  Yes Dettinger, Fransisca Kaufmann, MD  olmesartan (BENICAR) 5 MG tablet Take 1 tablet (5 mg total) by mouth daily. 11/06/20  Yes Dettinger, Fransisca Kaufmann, MD  albuterol (VENTOLIN HFA) 108 (90 Base) MCG/ACT inhaler Inhale 2 puffs into the lungs every 6 (six) hours as needed for wheezing or shortness of breath. 10/22/20   Loman Brooklyn, FNP  aspirin EC 81 MG tablet Take 81 mg by mouth daily. Swallow whole.    [provider]  benzonatate (TESSALON) 100 MG capsule Take 2 capsules (200 mg total) by mouth 3 (three) times daily as needed for cough. 10/23/20   Loman Brooklyn, FNP  diclofenac (VOLTAREN) 75 MG EC tablet Take 1 tablet (75 mg total) by mouth 2 (two) times daily. 02/06/21   Dettinger, Fransisca Kaufmann, MD  GNP ULTICARE PEN NEEDLES 32G X 6 MM MISC EVERY DAY 01/05/20   Dettinger, Fransisca Kaufmann, MD  ibuprofen (ADVIL)  600 MG tablet Take 1 tablet (600 mg total) by mouth every 8 (eight) hours as needed. 11/01/19   Dettinger, Fransisca Kaufmann, MD  metFORMIN (GLUCOPHAGE-XR) 500 MG 24 hr tablet Take 2 tablets (1,000 mg total) by mouth 2 (two) times daily. 11/06/20   Dettinger, Fransisca Kaufmann, MD  nitroGLYCERIN (NITROSTAT) 0.4 MG SL tablet Place 0.4 mg under the tongue every 5 (five) minutes as needed for chest pain.    [provider]     Family History  Problem Relation Age of Onset   Diabetes Mother    Heart disease Mother     Heart disease Father    Diabetes Father     Social History   Socioeconomic History   Marital status: Married    Spouse name: Not on file   Number of children: Not on file   Years of education: Not on file   Highest education level: Not on file  Occupational History   Not on file  Tobacco Use   Smoking status: Never   Smokeless tobacco: Never  Substance and Sexual Activity   Alcohol use: Never   Drug use: Never   Sexual activity: Yes    Comment: married for 2 years, widowed twice, 10 children  Other Topics Concern   Not on file  Social History Narrative   Not on file   Social Determinants of Health   Financial Resource Strain: Not on file  Food Insecurity: Not on file  Transportation Needs: Not on file  Physical Activity: Not on file  Stress: Not on file  Social Connections: Not on file    Review of Systems: A 12 point ROS discussed and pertinent positives are indicated in the HPI above.  All other systems are negative.  Review of Systems  Constitutional:  Positive for activity change. Negative for fatigue and fever.  Respiratory:  Negative for cough and shortness of breath.   Gastrointestinal:  Negative for abdominal pain.  Musculoskeletal:  Positive for back pain and gait problem.  Psychiatric/Behavioral:  Negative for behavioral problems and confusion.    Vital Signs: BP 131/87 (BP Location: Right Arm)   Pulse 74   Temp 98.6 F (37 C) (Oral)   Ht 6\' 3"  (1.905 m)   Wt 270 lb (122.5 kg)   SpO2 99%   BMI 33.75 kg/m   Physical Exam Vitals reviewed.  HENT:     Mouth/Throat:     Mouth: Mucous membranes are moist.  Cardiovascular:     Rate and Rhythm: Normal rate and regular rhythm.     Heart sounds: Normal heart sounds.  Pulmonary:     Effort: Pulmonary effort is normal.     Breath sounds: Normal breath sounds.  Abdominal:     Palpations: Abdomen is soft.  Musculoskeletal:        General: Normal range of motion.  Skin:    General: Skin is warm.   Neurological:     Mental Status: He is alert and oriented to person, place, and time.  Psychiatric:        Behavior: Behavior normal.    Imaging: MR Lumbar Spine w/o contrast  Result Date: 03/07/2021 CLINICAL DATA:  Low back pain with right-sided radiculopathy. Fall 1.5 months ago EXAM: MRI LUMBAR SPINE WITHOUT CONTRAST TECHNIQUE: Multiplanar, multisequence MR imaging of the lumbar spine was performed. No intravenous contrast was administered. COMPARISON:  MRI 09/02/2006.  X-ray 03/05/2021 FINDINGS: Technical Note: Despite efforts by the technologist and patient, motion artifact is present on today's exam  and could not be eliminated. This reduces exam sensitivity and specificity. Segmentation:  Standard. Alignment:  No significant static listhesis. Vertebrae: Postsurgical changes from posterior spinal fusion at L1 through L4. The L2 and L3 vertebral bodies are fused. Remote well-healed fracture deformity of L3. Unchanged superior endplate deformity at L2, which may be posttraumatic or represent a Schmorl's node. No evidence of discitis. Pathologic fracture of the L4 vertebral body involving the superior and inferior endplates with up to 67% vertebral body height loss centrally. There is diffuse confluent low T1 marrow signal throughout the L4 vertebral body with T2/STIR hyperintense signal. Subtle expansion of the posterior wall, which may be secondary to underlying lesion and/or compression. No extraosseous soft tissue component is identified. No additional marrow replacing bone lesions are seen within the field of view. Conus medullaris and cauda equina: Conus extends to the T12-L1 level. Conus and cauda equina appear normal. No abnormalities are evident within the epidural space. Paraspinal and other soft tissues: Negative. Disc levels: T12-L1: Shallow left paracentral disc protrusion. Mild facet hypertrophy. Mild left foraminal stenosis. No canal or right foraminal stenosis. Unchanged. L1-L2: Prior  fusion.  No evidence of foraminal or canal stenosis. L2-L3: Prior fusion.  No evidence of foraminal or canal stenosis. L3-L4: Prior fusion. There may be mild residual bilateral foraminal stenosis. No canal stenosis. L4-L5: Mild annular disc bulge. Mild-moderate bilateral facet arthropathy and ligamentum flavum buckling. Findings result in mild-to-moderate canal stenosis with moderate-severe bilateral foraminal stenosis. Findings have progressed from prior. L5-S1: Mild annular disc bulge. Moderate bilateral facet arthropathy. No foraminal or canal stenosis. IMPRESSION: 1. Pathologic fracture of the L4 vertebral body with up to 40% vertebral body height loss centrally. Diffuse marrow replacement throughout the vertebral body with appearance concerning for metastatic disease or myeloma. Lymphoma could also have this appearance. Further workup including hematologic testing is recommended. Nuclear medicine whole-body bone scan could be considered to assess for additional sites of bony involvement. 2. Progression of degenerative changes at L4-L5 with mild-to-moderate canal stenosis and moderate-severe bilateral foraminal stenosis. 3. Prior posterior spinal fusion at L1 through L4. No canal stenosis at the operative levels. Mild residual bilateral foraminal stenosis at L3-L4. These results will be called to the ordering clinician or representative by the Radiologist Assistant, and communication documented in the PACS or [employer]. Electronically Signed   By: Davina Poke D.O.   On: 03/07/2021 13:01   XR Lumbar Spine 2-3 Views  Result Date: 03/05/2021 AP and lateral views of the lumbar spine were obtained demonstrating a prior fusion from L1-L4 with pedicle screws.  No complications with the hardware.  There is narrowing of the L4-5 and particularly the L5-S1 disc space.  No acute changes.  There is complete collapse of the L3-4 disc space.  No prior films for comparison.  XR Pelvis 1-2 Views  Result  Date: 03/05/2021 AP the pelvis demonstrates intact bilateral total hip replacements.  There is a vertical position of the left acetabulum but no obvious wear and the patient is asymptomatic on both sides.  Excellent position on the right without obvious hardware complication  XR Tibia/Fibula Right  Result Date: 03/11/2021 AP and lateral of the right fibula and tibia were obtained.  I did not see any bony lesions where the patient was symptomatic in the lower third of the leg.  There are some degenerative changes in the ankle joint from prior fracture and surgery.  There is a single screw in the distal tibia.  Ankle mortise is intact but  with some irregularity of the joint surface consistent with probable traumatic arthritis   Labs:  CBC: Recent Labs    11/06/20 1329 03/11/21 1511 03/13/21 1131 03/27/21 0933  WBC 8.8 14.9* 10.2 10.1  HGB 12.3* 13.2 12.6* 12.9*  HCT 36.4* 40.0 36.1* 39.0  PLT 244 248 248 247    COAGS: Recent Labs    03/27/21 0933  INR 0.9    BMP: Recent Labs    05/06/20 1319 11/06/20 1329 03/11/21 1511 03/13/21 1131  NA 135 142 142 147*  K 4.3 4.8 4.4 4.9  CL 97 103 106 108*  CO2 23 21 24 23   GLUCOSE 231* 153* 130* 79  BUN 9 17 12 13   CALCIUM 8.6 8.9 8.6 9.0  CREATININE 0.69* 0.91 0.75 0.89  GFRNONAA 100  --   --   --   GFRAA 115  --   --   --     LIVER FUNCTION TESTS: Recent Labs    05/06/20 1319 11/06/20 1329 03/11/21 1511 03/13/21 1131  BILITOT 0.4 0.4 0.4 0.3  AST 8 8 10 9   ALT 7 7 7* 7  ALKPHOS 137* 93  --  139*  PROT 6.5 6.3 6.0*  5.7* 5.7*  ALBUMIN 3.3* 3.8  --  3.4*    TUMOR MARKERS: No results for input(s): AFPTM, CEA, CA199, CHROMGRNA in the last 8760 hours.  Assessment and Plan:  Back pain- worsening Abnormal MRI L4 lesion Scheduled for Lumbar 4 lesion biopsy Risks and benefits of Lumbar 4 lesion biopsy was discussed with the patient and/or patient's family including, but not limited to bleeding, infection, damage to  adjacent structures or low yield requiring additional tests.  All of the questions were answered and there is agreement to proceed.  Consent signed and in chart.   Thank you for this interesting consult.  I greatly enjoyed meeting Harrington Jobe. and look forward to participating in their care.  A copy of this report was sent to the requesting provider on this date.  Electronically Signed: Lavonia Drafts, PA-C 03/27/2021, 10:32 AM   I spent a total of  30 Minutes   in face to face in clinical consultation, greater than 50% of which was counseling/coordinating care for L 4 bx

## 2021-03-27 NOTE — Procedures (Signed)
S/P L4 core biopsy x2 passes . Fluoro guidance. Specimen sent for analysis. S.Bobi Daudelin MD

## 2021-03-27 NOTE — Sedation Documentation (Addendum)
Entry error

## 2021-03-29 ENCOUNTER — Other Ambulatory Visit: Payer: Self-pay

## 2021-03-29 ENCOUNTER — Ambulatory Visit
Admission: RE | Admit: 2021-03-29 | Discharge: 2021-03-29 | Disposition: A | Discharge status: Home / Self Care | Payer: Medicare Other | Source: Ambulatory Visit | Attending: Family Medicine | Admitting: Family Medicine

## 2021-03-29 DIAGNOSIS — R404 Transient alteration of awareness: Secondary | ICD-10-CM

## 2021-03-29 DIAGNOSIS — G459 Transient cerebral ischemic attack, unspecified: Secondary | ICD-10-CM

## 2021-03-29 DIAGNOSIS — R569 Unspecified convulsions: Secondary | ICD-10-CM

## 2021-03-31 ENCOUNTER — Other Ambulatory Visit: Payer: Self-pay | Admitting: *Deleted

## 2021-03-31 ENCOUNTER — Telehealth: Payer: Self-pay | Admitting: Orthopaedic Surgery

## 2021-03-31 DIAGNOSIS — Z9189 Other specified personal risk factors, not elsewhere classified: Secondary | ICD-10-CM

## 2021-03-31 DIAGNOSIS — E1149 Type 2 diabetes mellitus with other diabetic neurological complication: Secondary | ICD-10-CM

## 2021-03-31 DIAGNOSIS — M5432 Sciatica, left side: Secondary | ICD-10-CM

## 2021-03-31 DIAGNOSIS — I152 Hypertension secondary to endocrine disorders: Secondary | ICD-10-CM

## 2021-03-31 LAB — SURGICAL PATHOLOGY

## 2021-03-31 MED ORDER — ATORVASTATIN CALCIUM 20 MG PO TABS: 20.0000 mg | ORAL_TABLET | Freq: Every day | ORAL | 0 refills | Status: DC

## 2021-03-31 MED ORDER — BACLOFEN 10 MG PO TABS: 10.0000 mg | ORAL_TABLET | Freq: Three times a day (TID) | ORAL | 1 refills | Status: DC | PRN

## 2021-03-31 MED ORDER — GABAPENTIN 400 MG PO CAPS: 800.0000 mg | ORAL_CAPSULE | Freq: Two times a day (BID) | ORAL | 0 refills | Status: DC

## 2021-03-31 MED ORDER — METFORMIN HCL ER 500 MG PO TB24: 1000.0000 mg | ORAL_TABLET | Freq: Two times a day (BID) | ORAL | 0 refills | Status: DC

## 2021-03-31 NOTE — Telephone Encounter (Signed)
Pt called requesting a call back asking for his results from biopsy. Please call pt at [phone].

## 2021-04-01 ENCOUNTER — Other Ambulatory Visit: Payer: Self-pay

## 2021-04-01 DIAGNOSIS — R937 Abnormal findings on diagnostic imaging of other parts of musculoskeletal system: Secondary | ICD-10-CM

## 2021-04-01 DIAGNOSIS — M5442 Lumbago with sciatica, left side: Secondary | ICD-10-CM

## 2021-04-01 NOTE — Telephone Encounter (Signed)
Both referrals have been entered.

## 2021-04-01 NOTE — Telephone Encounter (Signed)
Called patient and discussed results so far. He has an appt with a neurologist on  Dec 28th to determine cause of leg pain. In meantime I would like Dr Ernestina Patches to consider an ESI -please consult. Also want one of the oncologists to evaluate for possible tumor causing the collapsed vertebral body-so far all the tests are negative including there biopsy. Just need reassurance. Thanks

## 2021-04-02 ENCOUNTER — Telehealth: Payer: Self-pay | Admitting: Oncology

## 2021-04-02 LAB — AEROBIC/ANAEROBIC CULTURE W GRAM STAIN (SURGICAL/DEEP WOUND)
Culture: NO GROWTH
Gram Stain: NONE SEEN

## 2021-04-02 NOTE — Telephone Encounter (Signed)
[facility] Scheduling Phone Note  I contacted [name]. regarding a referral from Dr.Whitfield (ortho) for purpose of evaluation and work up of L4 concerning for metastatic process.  [name]. was aware of his referral and reason.  Information on reason for referral was not necessary.  He did require a bit of guidance associating his 11/11 MR Lumbar to the 12/5 IR biopsy and those results being inconclusive.    Patient was informed about the [facility] and the intent being to complete a diagnostic/prognostic work-up for his suspicious findings.  He  is aware his appointment is with our Physician Assistant to identify a diagnostic plan of care and to arrange further oncologist or specialist care as indicated.  I confirmed with [name]. he  has transportation to his [facility] appointment.  Patient is aware to bring a list of medications, as well as insurance cards.  Visitor policy reviewed with patient as well, including what to expect on arrival to the [facility].  Thank you for the referral, and we look forward to [place]. and completing her diagnostic work-up and arranging her subsequent appointment with our oncologist team.  Diagnostic Clinic Specific Info:  Best contact/way to contact patient:  ** CHL updated to reflect?:  not applicable Is there a HC POA?:  No  Location of Diagnostic Clinic Appointment and Contact Info Provided to Patient: [facility] [place], [place] [address] [phone]   Clinical referral information:  03/07/2021 MR Lumbar Spine WO IMPRESSION: 1. Pathologic fracture of the L4 vertebral body with up to 40% vertebral body height loss centrally. Diffuse marrow replacement throughout the vertebral body with appearance concerning for metastatic disease or myeloma. Lymphoma could also have this appearance.  Further workup including hematologic testing is recommended. Nuclear medicine whole-body bone scan could be considered to assess for additional sites of bony involvement.  03/31/2021 IR Biopsy Results  FINAL MICROSCOPIC DIAGNOSIS:   A. BONE, L4, BIOPSY:  Segment of bone with normal bony spicules and normocellular bone marrow  with normal trilineage hematopoiesis.  There are no features suggestive of a lymphoproliferative or  myeloproliferative disorder.  Negative for metastatic carcinoma.  Please see comment.   Comment: The patient's clinical history is reviewed.  The submitted  specimen does not contain any features of a marrow infiltrative process  to explain the pathologic fracture.  This may be due to sampling  variation.  Re-biopsy may be considered.

## 2021-04-03 ENCOUNTER — Other Ambulatory Visit: Payer: Self-pay

## 2021-04-03 ENCOUNTER — Encounter: Payer: Self-pay | Admitting: Physical Medicine and Rehabilitation

## 2021-04-03 ENCOUNTER — Ambulatory Visit (INDEPENDENT_AMBULATORY_CARE_PROVIDER_SITE_OTHER): Payer: Medicare Other | Admitting: Physical Medicine and Rehabilitation

## 2021-04-03 VITALS — BP 148/76 | HR 91

## 2021-04-03 DIAGNOSIS — M5416 Radiculopathy, lumbar region: Secondary | ICD-10-CM | POA: Diagnosis not present

## 2021-04-03 DIAGNOSIS — M4726 Other spondylosis with radiculopathy, lumbar region: Secondary | ICD-10-CM

## 2021-04-03 DIAGNOSIS — M48062 Spinal stenosis, lumbar region with neurogenic claudication: Secondary | ICD-10-CM

## 2021-04-03 DIAGNOSIS — M47816 Spondylosis without myelopathy or radiculopathy, lumbar region: Secondary | ICD-10-CM | POA: Diagnosis not present

## 2021-04-03 DIAGNOSIS — M8448XA Pathological fracture, other site, initial encounter for fracture: Secondary | ICD-10-CM

## 2021-04-03 MED ORDER — TRAMADOL HCL 50 MG PO TABS: 50.0000 mg | ORAL_TABLET | Freq: Three times a day (TID) | ORAL | 0 refills | Status: DC | PRN

## 2021-04-03 MED ORDER — DIAZEPAM 5 MG PO TABS: ORAL_TABLET | ORAL | 0 refills | Status: DC

## 2021-04-03 MED ORDER — BACLOFEN 10 MG PO TABS: ORAL_TABLET | ORAL | 0 refills | Status: DC

## 2021-04-03 NOTE — Progress Notes (Signed)
Pt state lower back pain that travels to his right leg ans sometime his left. Pt state he can't walk due to the pressure. Pt state sitting and laying down makes the pain worse. Pt state he uses pain cream and takes over the counter pain meds to help ease his pain.  Numeric Pain Rating Scale and Functional Assessment Average Pain 10 Pain Right Now 10 My pain is intermittent, sharp, burning, and aching Pain is worse with: walking, sitting, standing, some activites, and laying down Pain improves with: rest, heat/ice, and medication   In the last MONTH (on 0-10 scale) has pain interfered with the following?  1. General activity like being  able to carry out your everyday physical activities such as walking, climbing stairs, carrying groceries, or moving a chair?  Rating(7)  2. Relation with others like being able to carry out your usual social activities and roles such as  activities at home, at work and in your community. Rating(8)  3. Enjoyment of life such that you have  been bothered by emotional problems such as feeling anxious, depressed or irritable?  Rating(9)

## 2021-04-03 NOTE — Progress Notes (Signed)
[name]. - 65 y.o. male MRN [id number]  Date of birth: 1955-06-06  Office Visit Note: Visit Date: 04/03/2021 PCP: Dettinger, Fransisca Kaufmann, MD Referred by: Garald Balding, MD  Subjective: Chief Complaint  Patient presents with   Lower Back - Pain   Left Leg - Pain   Right Leg - Pain   HPI: [name]. is a 65 y.o. male who comes in today Per the request of Dr. Joni Fears for evaluation of right sided lower back pain radiating down leg.  Patient's wife is accompanying him during our visit today.  Patient reports pain has been chronic for many years. Patient reports pain is exacerbated by walking and prolonged standing. Patient reports he has recently over the last month started using walker and wheelchair at home due to severe pain. Patient describes pain as burning and numbness sensation, currently rates as 10 out of 10. Patient reports some relief of pain with home exercise regimen, rest, sitting and medications. States he is currently taking Gabapentin, Baclofen and Tylenol at home. Patient has a history of lumbar fusion at L1-L4 performed by Dr. Glenna Fellows in 2008. Patient also reports he did receive multiple lumbar epidural steroid injections prior  to his lumbar fusion in 2001 which he reports did help to alleviate his pain for a short period. Patient's recent lumbar MRI exhibits stable prior spinal fusion at L1 through L4, pathologic fracture of the L4 vertebral body involving the superior and inferior endplates with up to 80% vertebral body height loss centrally. There is also mild-moderate bilateral facet arthropathy and ligamentum flavum buckling resulting in mild-to-moderate canal stenosis with moderate-severe bilateral foraminal stenosis at the level of L4-L5. Patient had bilateral total hip arthroplasty performed by Dr. Joni Fears in 2009 and 2010.  Patient states he is normally a very active person however he has recently become more sedentary due to severe pain  and is having difficulty performing daily tasks at home.  Patient also reports recent swelling to right leg and foot which he contributes to inactivity.    Patient recently had L4 lesion biopsy due to abnormal finding of pathological fracture of L4 vertebral body noted on lumbar MRI, specimen did not contain any features of a marrow infiltrative process to explain pathological fracture. Patient is scheduled to follow-up with [facility] on 04/04/2021 for evaluation and discuss further treatments.   Patient denies focal weakness. Patient denies recent trauma or falls. Patient denies bowel and bladder incontinence.  Review of Systems  Musculoskeletal:  Positive for back pain.  Neurological:  Positive for tingling. Negative for sensory change, focal weakness and weakness.  All other systems reviewed and are negative. Otherwise per HPI.  Assessment & Plan: Visit Diagnoses:    ICD-10-CM   1. Lumbar radiculopathy  M54.16 Ambulatory referral to Physical Medicine Rehab    2. Spinal stenosis of lumbar region with neurogenic claudication  M48.062     3. Facet hypertrophy of lumbar region  M47.816     4. Other spondylosis with radiculopathy, lumbar region  M47.26     5. Pathological fracture of vertebra, unspecified pathological cause, initial encounter  M84.48XA        Plan: Findings:  Chronic, worsening and severe right-sided lower back pain radiating down leg. Patient continues to have excruciating pain despite good conservative therapies such as home exercise regimen, rest and use of medications. Patient's clinical presentation and exam are consistent with neurogenic claudication as a result of spinal  canal stenosis.  We did review recent lumbar MRI using images and spine model today in detail with patient and his wife. We believe the next step is to perform diagnostic and hopefully therapeutic right L4 and L5 transforaminal epidural steroid injections under fluoroscopic guidance. We  also spoke with patient in detail today regarding medication management and chronic pain. Patient's Baclofen prescription was refilled today, we also wrote a prescription for short course of Tramadol to help with severe pain. Patient did voice concerns about anxiety related to epidural steroid injections and we did place an order for pre-procedure Valium today.  Patient is scheduled for consultation at [facility] on 04/04/2021 to review results of recent L4 lesion biopsy and to discuss further treatments.  No red flag symptoms noted upon exam today.   Meds & Orders:  Meds ordered this encounter  Medications   baclofen (LIORESAL) 10 MG tablet    Sig: Take 1 tablet (10mg ) 3 times a day as needed for pain/muscle spasms.    Dispense:  60 each    Refill:  0    Order Specific Question:   Supervising Provider    Answer:   Dennard Nip   traMADol (ULTRAM) 50 MG tablet    Sig: Take 1 tablet (50 mg total) by mouth every 8 (eight) hours as needed for severe pain.    Dispense:  30 tablet    Refill:  0    Order Specific Question:   Supervising Provider    Answer:   Magnus Sinning [[id number]]   diazepam (VALIUM) 5 MG tablet    Sig: Take one tablet by mouth with food one hour prior to procedure. May repeat 30 minutes prior if needed.    Dispense:  2 tablet    Refill:  0    Order Specific Question:   Supervising Provider    Answer:   Magnus Sinning [[id number]]    Orders Placed This Encounter  Procedures   Ambulatory referral to Physical Medicine Rehab    Follow-up: Return for Right L4 and L5 transforaminal epidural steroid injection.   Procedures: No procedures performed      Clinical History: No specialty comments available.   He reports that he has never smoked. He has never used smokeless tobacco.  Recent Labs    07/15/20 1339 11/06/20 1329 02/06/21 1327  HGBA1C 5.9 7.7* 6.4*    Objective:  VS:  HT:    WT:   BMI:     BP: (!) 148/76  HR:91bpm  TEMP:  ( )  RESP:  Physical Exam Vitals reviewed.  HENT:     Head: Normocephalic and atraumatic.     Right Ear: External ear normal.     Left Ear: External ear normal.     Nose: Nose normal.     Mouth/Throat:     Mouth: Mucous membranes are moist.  Eyes:     Extraocular Movements: Extraocular movements intact.  Cardiovascular:     Rate and Rhythm: Normal rate.     Pulses: Normal pulses.  Pulmonary:     Effort: Pulmonary effort is normal.  Abdominal:     General: Abdomen is flat. There is no distension.  Musculoskeletal:        General: Tenderness present.     Cervical back: Normal range of motion.     Comments: Pt is slow to rise from seated position to standing. Good lumbar range of motion. Strong distal strength without clonus, no pain upon palpation of greater trochanters.  Sensation intact bilaterally. Ambulates with walker, gait slow and unsteady. Positive slump test.     Skin:    General: Skin is warm and dry.     Capillary Refill: Capillary refill takes less than 2 seconds.  Neurological:     Mental Status: He is alert and oriented to person, place, and time.     Gait: Gait abnormal.  Psychiatric:        Mood and Affect: Mood normal.    Ortho Exam  Imaging: No results found.  Past Medical/Family/Surgical/Social History: Medications & Allergies reviewed per EMR, new medications updated. Patient Active Problem List   Diagnosis Date Noted   Pain in right leg 03/11/2021   Low back pain 03/05/2021   Hyperlipidemia associated with type 2 diabetes mellitus ([place]) 02/01/2020   Hypertension associated with diabetes ([place]) 11/21/2018   [place] cardiac risk >20% in next 10 years 11/21/2018   Type 2 diabetes mellitus with neurological complications ([place]) 29/79/8921   Past Medical History:  Diagnosis Date   Cataract    Diabetes mellitus without complication ([place])    Neuropathy    Family History  Problem Relation Age of Onset   Diabetes Mother    Heart disease Mother     Heart disease Father    Diabetes Father    Past Surgical History:  Procedure Laterality Date   ANKLE SURGERY Right    BACK SURGERY     EYE SURGERY     cateracts   GASTROPLASTY  1978   IR FLUORO GUIDED NEEDLE PLC ASPIRATION/INJECTION LOC  03/27/2021   TOTAL HIP ARTHROPLASTY Bilateral    Social History   Occupational History   Not on file  Tobacco Use   Smoking status: Never   Smokeless tobacco: Never  Substance and Sexual Activity   Alcohol use: Never   Drug use: Never   Sexual activity: Yes    Comment: married for 2 years, widowed twice, 10 children

## 2021-04-04 ENCOUNTER — Encounter: Payer: Self-pay | Admitting: Physician Assistant

## 2021-04-04 ENCOUNTER — Inpatient Hospital Stay: Payer: Medicare Other | Attending: Physician Assistant | Admitting: Physician Assistant

## 2021-04-04 ENCOUNTER — Inpatient Hospital Stay: Payer: Medicare Other

## 2021-04-04 VITALS — BP 145/96 | HR 72 | Temp 97.3°F | Resp 20 | Wt 261.1 lb

## 2021-04-04 DIAGNOSIS — M8448XA Pathological fracture, other site, initial encounter for fracture: Secondary | ICD-10-CM | POA: Diagnosis present

## 2021-04-04 DIAGNOSIS — E119 Type 2 diabetes mellitus without complications: Secondary | ICD-10-CM | POA: Diagnosis not present

## 2021-04-04 DIAGNOSIS — H269 Unspecified cataract: Secondary | ICD-10-CM

## 2021-04-04 DIAGNOSIS — G629 Polyneuropathy, unspecified: Secondary | ICD-10-CM | POA: Diagnosis not present

## 2021-04-04 LAB — CMP (CANCER CENTER ONLY)
ALT: <5 U/L (ref 0–44)
AST: 9 U/L — ABNORMAL LOW (ref 15–41)
Albumin: 3.3 g/dL — ABNORMAL LOW (ref 3.5–5.0)
Alkaline Phosphatase: 118 U/L (ref 38–126)
Anion gap: 9 (ref 5–15)
BUN: 16 mg/dL (ref 8–23)
CO2: 25 mmol/L (ref 22–32)
Calcium: 8.6 mg/dL — ABNORMAL LOW (ref 8.9–10.3)
Chloride: 107 mmol/L (ref 98–111)
Creatinine: 0.82 mg/dL (ref 0.61–1.24)
GFR, Estimated: >60 mL/min (ref >60)
Glucose, Bld: 88 mg/dL (ref 70–99)
Potassium: 4.2 mmol/L (ref 3.5–5.1)
Sodium: 141 mmol/L (ref 135–145)
Total Bilirubin: 0.7 mg/dL (ref 0.3–1.2)
Total Protein: 6.4 g/dL — ABNORMAL LOW (ref 6.5–8.1)

## 2021-04-04 LAB — CBC WITH DIFFERENTIAL (CANCER CENTER ONLY)
Abs Immature Granulocytes: 0.04 10*3/uL (ref 0.00–0.07)
Basophils Absolute: 0.1 10*3/uL (ref 0.0–0.1)
Basophils Relative: 1 %
Eosinophils Absolute: 0.3 10*3/uL (ref 0.0–0.5)
Eosinophils Relative: 3 %
HCT: 37.6 % — ABNORMAL LOW (ref 39.0–52.0)
Hemoglobin: 12.4 g/dL — ABNORMAL LOW (ref 13.0–17.0)
Immature Granulocytes: 0 %
Lymphocytes Relative: 23 %
Lymphs Abs: 2.4 10*3/uL (ref 0.7–4.0)
MCH: 29.2 pg (ref 26.0–34.0)
MCHC: 33 g/dL (ref 30.0–36.0)
MCV: 88.7 fL (ref 80.0–100.0)
Monocytes Absolute: 0.9 10*3/uL (ref 0.1–1.0)
Monocytes Relative: 8 %
Neutro Abs: 6.8 10*3/uL (ref 1.7–7.7)
Neutrophils Relative %: 65 %
Platelet Count: 212 10*3/uL (ref 150–400)
RBC: 4.24 MIL/uL (ref 4.22–5.81)
RDW: 15.1 % (ref 11.5–15.5)
WBC Count: 10.5 10*3/uL (ref 4.0–10.5)
nRBC: 0 % (ref 0.0–0.2)

## 2021-04-04 LAB — LACTATE DEHYDROGENASE: LDH: 150 U/L (ref 98–192)

## 2021-04-04 NOTE — Progress Notes (Addendum)
[place] [phone]   [phone]  INITIAL CONSULTATION:  Patient Care Team: Dettinger, Fransisca Kaufmann, MD as PCP - General (Family Medicine) Lavera Guise, [facility] (Pharmacist)  CHIEF COMPLAINTS/PURPOSE OF CONSULTATION:  Pathologic fracture of the L4 vertebral body  HISTORY OF PRESENTING ILLNESS:  [name]. 65 y.o. male with medical history significant for diabetes complicated by neuropathy and cataract.   On review of the previous records, [name] presented with progressive low back pain with right sided radiculopathy for two months. He underwent MRI imaging of the lumbar vertebrae on 03/07/2021 that showed pathologic fracture of the L4 vertebral body. He proceed with biopsy of L4 verebral on 03/27/2021. Pathology was negative for malignancy. There was no features suggestive of a lymphoproliferative or myeloproliferative disorder.   On exam today, [name] continues to burning pain that radiates down his right lower extremity. The pain is significant enough where it affects his ambulation. He uses a walker or wheelchair to ambulate. 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 Additionally, he takes OTC tylenol or advil for pain control. He tried tramadol last night but that made him feel drowsy. He denies numbness in his right leg outside of his feet. He denies weakness of his right leg. He reports that he has noticed weight loss, approximately 14-15 lbs in the last two months. He underwent gastric surgery in the past and eats small, frequent meals. He denies nausea, vomiting or abdominal pain. His bowel habits are unchanged and he denies any easy bruising or signs of bleeding. He denies fevers, chills, night sweats, shortness of breath, chest pain or cough. He has no other complaints. Rest of the 10 point ROS is below.     MEDICAL HISTORY:  Past Medical History:   Diagnosis Date   Cataract    Diabetes mellitus without complication ([place])    Neuropathy     SURGICAL HISTORY: Past Surgical History:  Procedure Laterality Date   ANKLE SURGERY Right    BACK SURGERY     EYE SURGERY     cateracts   GASTROPLASTY  1978   IR FLUORO GUIDED NEEDLE PLC ASPIRATION/INJECTION LOC  03/27/2021   TOTAL HIP ARTHROPLASTY Bilateral     SOCIAL HISTORY: Social History   Socioeconomic History   Marital status: Married    Spouse name: Not on file   Number of children: Not on file   Years of education: Not on file   Highest education level: Not on file  Occupational History   Not on file  Tobacco Use   Smoking status: Never   Smokeless tobacco: Never  Substance and Sexual Activity   Alcohol use: Never   Drug use: Never   Sexual activity: Yes    Comment: married for 2 years, widowed twice, 10 children  Other Topics Concern   Not on file  Social History Narrative   Not on file   Social Determinants of Health   Financial Resource Strain: Not on file  Food Insecurity: Not on file  Transportation Needs: Not on file  Physical Activity: Not on file  Stress: Not on file  Social Connections: Not on file  Intimate Partner Violence: Not on file    FAMILY HISTORY: Family History  Problem Relation Age of Onset   Diabetes Mother    Heart disease Mother    Uterine cancer Mother    Heart disease Father    Diabetes  Father     ALLERGIES:  is allergic to bee venom.  MEDICATIONS:  Current Outpatient Medications  Medication Sig Dispense Refill   diclofenac Sodium (VOLTAREN) 1 % GEL Apply topically 4 (four) times daily.     aspirin EC 81 MG tablet Take 81 mg by mouth daily. Swallow whole.     atorvastatin (LIPITOR) 20 MG tablet Take 1 tablet (20 mg total) by mouth daily. 90 tablet 0   baclofen (LIORESAL) 10 MG tablet Take 1 tablet ($RemoveB'10mg'mSpvbwMQ$ ) 3 times a day as needed for pain/muscle spasms. 60 each 0   diazepam (VALIUM) 5 MG tablet Take one tablet by mouth  with food one hour prior to procedure. May repeat 30 minutes prior if needed. 2 tablet 0   Dulaglutide (TRULICITY) 3 JE/5.6DJ SOPN Inject 3 mg as directed once a week. 6.5 mL 3   DULoxetine (CYMBALTA) 30 MG capsule Take 1 capsule (30 mg total) by mouth at bedtime. 90 capsule 3   gabapentin (NEURONTIN) 400 MG capsule Take 2 capsules (800 mg total) by mouth 2 (two) times daily. 360 capsule 0   GNP ULTICARE PEN NEEDLES 32G X 6 MM MISC EVERY DAY 100 each 2   ibuprofen (ADVIL) 600 MG tablet Take 1 tablet (600 mg total) by mouth every 8 (eight) hours as needed. 90 tablet 3   Insulin Glargine (BASAGLAR KWIKPEN) 100 UNIT/ML Inject 34 Units into the skin daily. 33 mL 3   metFORMIN (GLUCOPHAGE-XR) 500 MG 24 hr tablet Take 2 tablets (1,000 mg total) by mouth 2 (two) times daily. 360 tablet 0   nitroGLYCERIN (NITROSTAT) 0.4 MG SL tablet Place 0.4 mg under the tongue every 5 (five) minutes as needed for chest pain.     olmesartan (BENICAR) 5 MG tablet Take 1 tablet (5 mg total) by mouth daily. (Patient not taking: Reported on 04/04/2021) 90 tablet 3   traMADol (ULTRAM) 50 MG tablet Take 1 tablet (50 mg total) by mouth every 8 (eight) hours as needed for severe pain. 30 tablet 0   No current facility-administered medications for this visit.    REVIEW OF SYSTEMS:   Constitutional: ( - ) fevers, ( - )  chills , ( - ) night sweats Eyes: ( - ) blurriness of vision, ( - ) double vision, ( - ) watery eyes Ears, nose, mouth, throat, and face: ( - ) mucositis, ( - ) sore throat Respiratory: ( - ) cough, ( - ) dyspnea, ( - ) wheezes Cardiovascular: ( - ) palpitation, ( - ) chest discomfort, ( - ) lower extremity swelling Gastrointestinal:  ( - ) nausea, ( - ) heartburn, ( - ) change in bowel habits Skin: ( - ) abnormal skin rashes Lymphatics: ( - ) new lymphadenopathy, ( - ) easy bruising Neurological: ( + ) numbness, ( - ) tingling, ( - ) new weaknesses Behavioral/Psych: ( - ) mood change, ( - ) new changes  All  other systems were reviewed with the patient and are negative.  PHYSICAL EXAMINATION: ECOG PERFORMANCE STATUS: 1 - Symptomatic but completely ambulatory  Vitals:   04/04/21 1309  BP: (!) 145/96  Pulse: 72  Resp: 20  Temp: (!) 97.3 F (36.3 C)  SpO2: 99%   Filed Weights   04/04/21 1309  Weight: 261 lb 1.6 oz (118.4 kg)    GENERAL: well appearing male in NAD  SKIN: skin color, texture, turgor are normal, no rashes or significant lesions EYES: conjunctiva are pink and non-injected, sclera clear OROPHARYNX: no  exudate, no erythema; lips, buccal mucosa, and tongue normal  NECK: supple, non-tender LYMPH:  no palpable lymphadenopathy in the cervical or supraclavicular lymph nodes.  LUNGS: clear to auscultation and percussion with normal breathing effort HEART: regular rate & rhythm and no murmurs and no lower extremity edema ABDOMEN: soft, non-tender, non-distended, normal bowel sounds Musculoskeletal: no cyanosis of digits and no clubbing  PSYCH: alert & oriented x 3, fluent speech NEURO: no focal motor/sensory deficits  LABORATORY DATA:  I have reviewed the data as listed CBC Latest Ref Rng & Units 04/04/2021 03/27/2021 03/13/2021  WBC 4.0 - 10.5 K/uL 10.5 10.1 10.2  Hemoglobin 13.0 - 17.0 g/dL 12.4(L) 12.9(L) 12.6(L)  Hematocrit 39.0 - 52.0 % 37.6(L) 39.0 36.1(L)  Platelets 150 - 400 K/uL 212 247 248    CMP Latest Ref Rng & Units 04/04/2021 03/13/2021 03/11/2021  Glucose 70 - 99 mg/dL 88 79 -  BUN 8 - 23 mg/dL 16 13 -  Creatinine 0.61 - 1.24 mg/dL 0.82 0.89 -  Sodium 135 - 145 mmol/L 141 147(H) -  Potassium 3.5 - 5.1 mmol/L 4.2 4.9 -  Chloride 98 - 111 mmol/L 107 108(H) -  CO2 22 - 32 mmol/L 25 23 -  Calcium 8.9 - 10.3 mg/dL 8.6(L) 9.0 -  Total Protein 6.5 - 8.1 g/dL 6.4(L) 5.7(L) 6.0(L)  Total Bilirubin 0.3 - 1.2 mg/dL 0.7 0.3 -  Alkaline Phos 38 - 126 U/L 118 139(H) -  AST 15 - 41 U/L 9(L) 9 -  ALT 0 - 44 U/L <5 7 -     RADIOGRAPHIC STUDIES: I have personally  reviewed the radiological images as listed and agreed with the findings in the report. MR Brain Wo Contrast  Result Date: 03/31/2021 CLINICAL DATA:  Transient alteration of awareness. Seizure-like activity. Transient ischemic attack. Additional history provided by scanning technologist: Patient reports difficulty walking, talking and confusion with tremors for 3 weeks. EXAM: MRI HEAD WITHOUT CONTRAST TECHNIQUE: Multiplanar, multiecho pulse sequences of the brain and surrounding structures were obtained without intravenous contrast. COMPARISON:  No pertinent prior exams available for comparison. FINDINGS: Brain: Mild generalized cerebral and cerebellar atrophy. Mild multifocal T2 FLAIR hyperintense signal abnormality within the cerebral white matter, nonspecific but compatible chronic small vessel ischemic disease. Partially empty sella turcica, a nonspecific finding. There is no acute infarct. No evidence of an intracranial mass. No chronic intracranial blood products. No extra-axial fluid collection. No midline shift. Vascular: Maintained flow voids within the proximal large arterial vessels. Skull and upper cervical spine: No focal suspicious marrow lesion. Sinuses/Orbits: Visualized orbits show no acute finding. Bilateral lens replacements. Trace mucosal thickening within the bilateral ethmoid and maxillary sinuses. Other: Small left mastoid effusion. IMPRESSION: No evidence of acute intracranial abnormality. Mild chronic small vessel ischemic changes within the cerebral white matter. Mild generalized cerebral and cerebellar atrophy. Minimal mucosal thickening within the bilateral ethmoid and maxillary sinuses. Small left mastoid effusion. Electronically Signed   By: Kellie Simmering D.O.   On: 03/31/2021 08:25   IR Fluoro Guide Ndl Plmt / BX  Result Date: 03/31/2021 INDICATION: Low back pain secondary to L4 mild compression fracture with abnormal signal on MRI of the lumbosacral spine. EXAM: FLUOROSCOPIC  GUIDED CORE BIOPSY AT L4 MEDICATIONS: Versed 1 mg IV.  Fentanyl 25 mcg IV. ANESTHESIA/SEDATION: Moderate (conscious) sedation was employed during this procedure. A total of Versed mg and Fentanyl 25 mcg was administered intravenously by the radiology nurse. Total intra-service moderate Sedation Time: 20 minutes. The patient's level of consciousness  and vital signs were monitored continuously by radiology nursing throughout the procedure under my direct supervision. FLUOROSCOPY TIME:  Fluoroscopy Time: 2 minutes 54 seconds (455 mGy). COMPLICATIONS: None immediate. PROCEDURE: Informed written consent was obtained from the patient after a thorough discussion of the procedural risks, benefits and alternatives. All questions were addressed. Maximal Sterile Barrier Technique was utilized including caps, mask, sterile gowns, sterile gloves, sterile drape, hand hygiene and skin antiseptic. A timeout was performed prior to the initiation of the procedure. The patient was laid prone on the fluoroscopic table. After careful evaluation under biplane fluoroscopy, it was decided to proceed with a left transpedicular approach. The skin overlying the approach was then infiltrated with 0.25% bupivacaine, and carried to the paraspinal musculature. Under biplane fluoroscopy, a 13 gauge Cook spinal needle was then advanced and positioned just inside the left pedicle at L4. Through this, two passes were made with a 16 gauge core biopsy needle using a 20 mL syringe. Tissue obtained was sent for microbiology analysis, and surgical pathology as per requests of the referring MD. The needles were removed. Hemostasis was achieved at the skin entry site. Patient was then transferred to the short-stay in good condition. IMPRESSION: Status post fluoroscopic guided core biopsy at L4 via the left transpedicular approach as described above. Electronically Signed   By: Luanne Bras M.D.   On: 03/31/2021 08:18   XR Tibia/Fibula  Right  Result Date: 03/11/2021 AP and lateral of the right fibula and tibia were obtained.  I did not see any bony lesions where the patient was symptomatic in the lower third of the leg.  There are some degenerative changes in the ankle joint from prior fracture and surgery.  There is a single screw in the distal tibia.  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 Is a 65 y.o. male who presents to the diagnostic clinic for initial evaluation for pathologic fracture of the L4 vertebral body. We reviewed biopsy results that was reassuring as it shows no features suggestive of metastatic disease,  lymphoproliferative or myeloproliferative disorder. Additionally, PSA level was checked on 03/11/2021 that was within normal limits.   We recommend serologic evaluation to be rule out any hematologic process with CBC, CMP, LDH, multiple myeloma panel with SPEP/IFE and serum free light chains.  #Pathologic fracture of L4 vertebrae: --Biopsy on 03/27/2021 was negative for malignancy, lymphoproliferative or myeloproliferative disorder. --Labs today to check CBC, CMP, SPEP/IFE, LDH and serum free light chains --RTC based on above workup   Orders Placed This Encounter  Procedures   CBC with Differential ([place] Only)    Standing Status:   Future    Number of Occurrences:   1    Standing Expiration Date:   04/04/2022   CMP ([place] only)    Standing Status:   Future    Number of Occurrences:   1    Standing Expiration Date:   04/04/2022   Lactate dehydrogenase (LDH)    Standing Status:   Future    Number of Occurrences:   1    Standing Expiration Date:   04/04/2022   Kappa/lambda light chains    Standing Status:   Future    Number of Occurrences:   1    Standing Expiration Date:   04/04/2022   Multiple Myeloma Panel (SPEP&IFE w/QIG)    Standing Status:   Future  Number of  Occurrences:   1    Standing Expiration Date:   04/04/2022    All questions were answered. The patient knows to call the clinic with any problems, questions or concerns.  I have spent a total of 60 minutes minutes of face-to-face and non-face-to-face time, preparing to see the patient, obtaining and/or reviewing separately obtained history, performing a medically appropriate examination, counseling and educating the patient, ordering tests, documenting clinical information in the electronic health record, and care coordination.   Dede Query, PA-C Department of Hematology/Oncology [place] [facility] Phone: [phone]

## 2021-04-07 DIAGNOSIS — M8440XA Pathological fracture, unspecified site, initial encounter for fracture: Secondary | ICD-10-CM | POA: Insufficient documentation

## 2021-04-07 LAB — KAPPA/LAMBDA LIGHT CHAINS
Kappa free light chain: 37.8 mg/L — ABNORMAL HIGH (ref 3.3–19.4)
Kappa, lambda light chain ratio: 0.6 (ref 0.26–1.65)
Lambda free light chains: 63.3 mg/L — ABNORMAL HIGH (ref 5.7–26.3)

## 2021-04-09 LAB — MULTIPLE MYELOMA PANEL, SERUM
Albumin SerPl Elph-Mcnc: 3.2 g/dL (ref 2.9–4.4)
Albumin/Glob SerPl: 1.2 (ref 0.7–1.7)
Alpha 1: 0.3 g/dL (ref 0.0–0.4)
Alpha2 Glob SerPl Elph-Mcnc: 0.8 g/dL (ref 0.4–1.0)
B-Globulin SerPl Elph-Mcnc: 1.1 g/dL (ref 0.7–1.3)
Gamma Glob SerPl Elph-Mcnc: 0.8 g/dL (ref 0.4–1.8)
Globulin, Total: 2.9 g/dL (ref 2.2–3.9)
IgA: 377 mg/dL (ref 61–437)
IgG (Immunoglobin G), Serum: 827 mg/dL (ref 603–1613)
IgM (Immunoglobulin M), Srm: 110 mg/dL (ref 20–172)
Total Protein ELP: 6.1 g/dL (ref 6.0–8.5)

## 2021-04-11 ENCOUNTER — Telehealth: Payer: Self-pay

## 2021-04-11 ENCOUNTER — Telehealth: Payer: Self-pay | Admitting: Physician Assistant

## 2021-04-11 DIAGNOSIS — M8448XA Pathological fracture, other site, initial encounter for fracture: Secondary | ICD-10-CM

## 2021-04-11 NOTE — Telephone Encounter (Signed)
I called Mr. [name] to review the lab results from 04/04/2021. Findings showed no evidence of monoclonal protein suggesting any plasma cell disorders. Discussed case with Dr. Lorenso Courier who recommended a PET scan to assure no other abnormal findings concerning for malignancy. We will plan to get PET scheduled in the next 1-2 weeks.   Patient expressed understanding and satisfaction with the plan provided.

## 2021-04-11 NOTE — Telephone Encounter (Signed)
Pt advised his PET scan is scheduled for 04/25/2021 at 9:00 am arriving at 8:30.  NPO 6 hrs prior but can have water. Meal prior he needs to avoid carbs and he needs to hold insulin the morning of scan.  Pt with understanding

## 2021-04-22 ENCOUNTER — Telehealth: Payer: Self-pay | Admitting: Physical Medicine and Rehabilitation

## 2021-04-22 NOTE — Telephone Encounter (Signed)
Pt called stating he is in a lot of pain and wants to make sure we didn't forget to send his tramadol and valium rx called in. Pt states he only has one tramadol tablet left and he doesn't want to risk running out and being in more pain; he would like a CB when this has been sent in please.  [phone]

## 2021-04-22 NOTE — Telephone Encounter (Signed)
Patient's wife called. Patient would like a refill on tramadol called in also the Valium wasn't called in she says. Her call back number is [phone]

## 2021-04-23 ENCOUNTER — Ambulatory Visit (INDEPENDENT_AMBULATORY_CARE_PROVIDER_SITE_OTHER): Payer: Medicare Other | Admitting: Neurology

## 2021-04-23 ENCOUNTER — Encounter: Payer: Self-pay | Admitting: Neurology

## 2021-04-23 ENCOUNTER — Other Ambulatory Visit: Payer: Self-pay | Admitting: Physical Medicine and Rehabilitation

## 2021-04-23 VITALS — BP 127/82 | HR 78 | Ht 75.0 in | Wt 261.0 lb

## 2021-04-23 DIAGNOSIS — R252 Cramp and spasm: Secondary | ICD-10-CM

## 2021-04-23 DIAGNOSIS — G4730 Sleep apnea, unspecified: Secondary | ICD-10-CM

## 2021-04-23 DIAGNOSIS — U099 Post covid-19 condition, unspecified: Secondary | ICD-10-CM

## 2021-04-23 MED ORDER — TRAMADOL HCL 50 MG PO TABS: 50.0000 mg | ORAL_TABLET | Freq: Three times a day (TID) | ORAL | 0 refills | Status: DC | PRN

## 2021-04-23 MED ORDER — DIAZEPAM 5 MG PO TABS
ORAL_TABLET | ORAL | 0 refills | Status: DC
Start: 2021-04-23 — End: 2021-05-28

## 2021-04-23 NOTE — Progress Notes (Signed)
[facility]  PATIENT: [name]. DOB: [birth year]  REQUESTING CLINICIAN: Dettinger, Fransisca Kaufmann, MD HISTORY FROM: Patient and spouse  REASON FOR VISIT: Jerk like movements   HISTORICAL  CHIEF COMPLAINT:  Chief Complaint  Patient presents with   New Patient (Initial Visit)    Rm 11. Accompanied by wife. NP internal referral for Transient alteration of awareness, Seizure-like activity, TIA. Pt c/o jerking movements in both arms that began about one month ago while driving, happening daily since. Pt's wife states he is nodding off during conversations. He c/o  afternoon tiredness and difficulty finding words.    HISTORY OF PRESENT ILLNESS:  This is a 65 year old gentleman past medical history of diabetes who is presenting with new onset of jerk like movements in the arms.  Patient stated a year ago he was admitted with COVID infection, was in ICU for 14 days and since being discharged from the hospital he has brain fog, word finding difficulty.  60-month ago he started experiencing right leg pain, thought to be sciatic nerve but on further work-up, he was noted to have a L4 vertebral fracture.  He also reported during this time he had weight loss and noted to be anemic and currently he is undergoing extensive work-up to determine the cause of his symptoms and also to rule out any malignancy.   Patient reported 26-month ago while driving he is noted his right arm was jerking uncontrollably and he could not stop it.  He had to pull on the side of the road and wife took him to the hospital where he had his full stroke work-up which was negative.  Since that day he has been having jerk like movement almost every day.  During this time he is conscious, denies any falls, denies any other abnormal movements.   On further history patient wife described severe snoring, episode of apnea during sleep and also daytime sleepiness  Wife stated that patient can sleep at any time  during conversation, while watching TV.  During our interview he was noted to sleep multiple times.  He has never been evaluated or treated for sleep apnea.     OTHER MEDICAL CONDITIONS: DM   REVIEW OF SYSTEMS: Full 14 system review of systems performed and negative with exception of: as noted in the HPI   ALLERGIES: Allergies  Allergen Reactions   Bee Venom     HOME MEDICATIONS: Outpatient Medications Prior to Visit  Medication Sig Dispense Refill   aspirin EC 81 MG tablet Take 81 mg by mouth daily. Swallow whole.     atorvastatin (LIPITOR) 20 MG tablet Take 1 tablet (20 mg total) by mouth daily. 90 tablet 0   baclofen (LIORESAL) 10 MG tablet Take 1 tablet (10mg ) 3 times a day as needed for pain/muscle spasms. 60 each 0   diazepam (VALIUM) 5 MG tablet Take one tablet by mouth with food one hour prior to procedure. May repeat 30 minutes prior if needed. 2 tablet 0   diclofenac Sodium (VOLTAREN) 1 % GEL Apply topically 4 (four) times daily.     Dulaglutide (TRULICITY) 3 HG/9.9ME SOPN Inject 3 mg as directed once a week. 6.5 mL 3   DULoxetine (CYMBALTA) 30 MG capsule Take 1 capsule (30 mg total) by mouth at bedtime. 90 capsule 3   gabapentin (NEURONTIN) 400 MG capsule Take 2 capsules (800 mg total) by mouth 2 (two) times daily. 360 capsule 0   GNP ULTICARE PEN NEEDLES 32G X 6 MM  MISC EVERY DAY 100 each 2   Insulin Glargine (BASAGLAR KWIKPEN) 100 UNIT/ML Inject 34 Units into the skin daily. 33 mL 3   metFORMIN (GLUCOPHAGE-XR) 500 MG 24 hr tablet Take 2 tablets (1,000 mg total) by mouth 2 (two) times daily. 360 tablet 0   nitroGLYCERIN (NITROSTAT) 0.4 MG SL tablet Place 0.4 mg under the tongue every 5 (five) minutes as needed for chest pain.     traMADol (ULTRAM) 50 MG tablet Take 1 tablet (50 mg total) by mouth every 8 (eight) hours as needed for moderate pain or severe pain. 15 tablet 0   ibuprofen (ADVIL) 600 MG tablet Take 1 tablet (600 mg total) by mouth every 8 (eight) hours as  needed. 90 tablet 3   olmesartan (BENICAR) 5 MG tablet Take 1 tablet (5 mg total) by mouth daily. 90 tablet 3   No facility-administered medications prior to visit.    PAST MEDICAL HISTORY: Past Medical History:  Diagnosis Date   Cataract    Diabetes mellitus without complication ([place])    Neuropathy     PAST SURGICAL HISTORY: Past Surgical History:  Procedure Laterality Date   ANKLE SURGERY Right    BACK SURGERY     EYE SURGERY     cateracts   GASTROPLASTY  1978   IR FLUORO GUIDED NEEDLE PLC ASPIRATION/INJECTION LOC  03/27/2021   TOTAL HIP ARTHROPLASTY Bilateral     FAMILY HISTORY: Family History  Problem Relation Age of Onset   Diabetes Mother    Heart disease Mother    Uterine cancer Mother    Heart disease Father    Diabetes Father     SOCIAL HISTORY: Social History   Socioeconomic History   Marital status: Married    Spouse name: Not on file   Number of children: Not on file   Years of education: Not on file   Highest education level: Not on file  Occupational History   Not on file  Tobacco Use   Smoking status: Never   Smokeless tobacco: Never  Substance and Sexual Activity   Alcohol use: Never   Drug use: Never   Sexual activity: Yes    Comment: married for 2 years, widowed twice, 10 children  Other Topics Concern   Not on file  Social History Narrative   Not on file   Social Determinants of Health   Financial Resource Strain: Not on file  Food Insecurity: Not on file  Transportation Needs: Not on file  Physical Activity: Not on file  Stress: Not on file  Social Connections: Not on file  Intimate Partner Violence: Not on file    PHYSICAL EXAM  GENERAL EXAM/CONSTITUTIONAL: Vitals:  Vitals:   04/23/21 1334  BP: 127/82  Pulse: 78  Weight: 261 lb (118.4 kg)  Height: 6\' 3"  (1.905 m)   Body mass index is 32.62 kg/m. Wt Readings from Last 3 Encounters:  04/23/21 261 lb (118.4 kg)  04/04/21 261 lb 1.6 oz (118.4 kg)  03/27/21 270 lb  (122.5 kg)   Patient is in no distress; well developed, nourished and groomed; neck is supple, sitting in his wheelchair, noted to be sleeping multiple times during our interview.   CARDIOVASCULAR: Examination of carotid arteries is normal; no carotid bruits Regular rate and rhythm, no murmurs Examination of peripheral vascular system by observation and palpation is normal  EYES: Pupils round and reactive to light, Visual fields full to confrontation, Extraocular movements intacts,   MUSCULOSKELETAL: Gait, strength, tone, movements noted in  Neurologic exam below  NEUROLOGIC: MENTAL STATUS:  No flowsheet data found. awake, alert, oriented to person, place and time recent and remote memory intact normal attention and concentration language fluent, comprehension intact, naming intact fund of knowledge appropriate  CRANIAL NERVE:  2nd, 3rd, 4th, 6th - pupils equal and reactive to light, visual fields full to confrontation, extraocular muscles intact, no nystagmus 5th - facial sensation symmetric 7th - facial strength symmetric 8th - hearing intact 9th - palate elevates symmetrically, uvula midline 11th - shoulder shrug symmetric 12th - tongue protrusion midline  MOTOR:  normal bulk and tone, full strength in the BUE, BLE  SENSORY:  normal and symmetric to light touch  COORDINATION:  finger-nose-finger, fine finger movements normal  REFLEXES:  deep tendon reflexes present and symmetric  GAIT/STATION:  Deferred      DIAGNOSTIC DATA (LABS, IMAGING, TESTING) - I reviewed patient records, labs, notes, testing and imaging myself where available.  Lab Results  Component Value Date   WBC 10.5 04/04/2021   HGB 12.4 (L) 04/04/2021   HCT 37.6 (L) 04/04/2021   MCV 88.7 04/04/2021   PLT 212 04/04/2021      Component Value Date/Time   NA 141 04/04/2021 1410   NA 147 (H) 03/13/2021 1131   K 4.2 04/04/2021 1410   CL 107 04/04/2021 1410   CO2 25 04/04/2021 1410    GLUCOSE 88 04/04/2021 1410   BUN 16 04/04/2021 1410   BUN 13 03/13/2021 1131   CREATININE 0.82 04/04/2021 1410   CREATININE 0.75 03/11/2021 1511   CALCIUM 8.6 (L) 04/04/2021 1410   PROT 6.4 (L) 04/04/2021 1410   PROT 5.7 (L) 03/13/2021 1131   ALBUMIN 3.3 (L) 04/04/2021 1410   ALBUMIN 3.4 (L) 03/13/2021 1131   AST 9 (L) 04/04/2021 1410   ALT <5 04/04/2021 1410   ALKPHOS 118 04/04/2021 1410   BILITOT 0.7 04/04/2021 1410   GFRNONAA >60 04/04/2021 1410   GFRAA 115 05/06/2020 1319   Lab Results  Component Value Date   CHOL 85 (L) 11/06/2020   HDL 39 (L) 11/06/2020   LDLCALC 27 11/06/2020   TRIG 102 11/06/2020   CHOLHDL 2.2 11/06/2020   Lab Results  Component Value Date   HGBA1C 6.4 (H) 02/06/2021   No results found for: VITAMINB12 No results found for: TSH   MRI Brain without contrast  No evidence of acute intracranial abnormality. Mild chronic small vessel ischemic changes within the cerebral white matter. Mild generalized cerebral and cerebellar atrophy. Minimal mucosal thickening within the bilateral ethmoid and maxillary sinuses. Small left mastoid effusion   ASSESSMENT AND PLAN  66 y.o. year old male with past medical history of diabetes who is presenting with 23-month history of jerk like movement of his right arm.  During this time he is awake and alert, denies any fall denies any injury.  It is not clear what these jerking-like movements are but will order a routine EEG to rule out seizures. Patient also report memory problem brain fog word finding difficulty which is likely low sequela from [place].  I will contact him to go over the EEG result and follow-up with him in 3 months.  Strongly advised the patient to continue follow-up with his primary care doctor for his current work-up including nuclear medicine scan.  Due to his history of severe snoring and apnea episode daytime sleepiness I will refer him to a sleep neurology for evaluation of sleep apnea.  Return sooner  if worse   1. Jerking  movements of extremities   2. Long COVID   3. Sleep apnea, unspecified type     Patient Instructions  Continue current medications  Referral to sleep neurology for sleep apnea evaluation Routine EEG  Follow up in 3 months or sooner if worse     Orders Placed This Encounter  Procedures   Ambulatory referral to Neurology   EEG adult    No orders of the defined types were placed in this encounter.   Return in about 3 months (around 07/22/2021).   Alric Ran, MD 04/23/2021, 6:00 PM  [facility] [address], [place], [place] [address] [phone]

## 2021-04-23 NOTE — Patient Instructions (Addendum)
Continue current medications  Referral to sleep neurology for sleep apnea evaluation Routine EEG  Follow up in 3 months or sooner if worse

## 2021-04-24 ENCOUNTER — Other Ambulatory Visit: Payer: Self-pay | Admitting: Family Medicine

## 2021-04-25 ENCOUNTER — Other Ambulatory Visit: Payer: Self-pay

## 2021-04-25 ENCOUNTER — Ambulatory Visit (HOSPITAL_COMMUNITY)
Admission: RE | Admit: 2021-04-25 | Discharge: 2021-04-25 | Disposition: A | Discharge status: Home / Self Care | Payer: Medicare Other | Source: Ambulatory Visit | Attending: Physician Assistant | Admitting: Physician Assistant

## 2021-04-25 DIAGNOSIS — M8448XA Pathological fracture, other site, initial encounter for fracture: Secondary | ICD-10-CM | POA: Insufficient documentation

## 2021-04-25 LAB — GLUCOSE, CAPILLARY: Glucose-Capillary: 61 mg/dL — ABNORMAL LOW (ref 70–99)

## 2021-04-25 MED ORDER — FLUDEOXYGLUCOSE F - 18 (FDG) INJECTION
12.9900 | Freq: Once | INTRAVENOUS | Status: AC | PRN
  Administered 2021-04-25: 09:00:00 12.99 via INTRAVENOUS

## 2021-04-25 MED ORDER — FLUDEOXYGLUCOSE F - 18 (FDG) INJECTION: 12.9900 | Freq: Once | INTRAVENOUS | Status: DC | PRN

## 2021-04-29 ENCOUNTER — Ambulatory Visit (INDEPENDENT_AMBULATORY_CARE_PROVIDER_SITE_OTHER): Payer: Medicare PPO | Admitting: Physical Medicine and Rehabilitation

## 2021-04-29 ENCOUNTER — Ambulatory Visit: Payer: Self-pay

## 2021-04-29 ENCOUNTER — Other Ambulatory Visit: Payer: Self-pay

## 2021-04-29 ENCOUNTER — Telehealth: Payer: Self-pay | Admitting: Physician Assistant

## 2021-04-29 ENCOUNTER — Encounter: Payer: Self-pay | Admitting: Physical Medicine and Rehabilitation

## 2021-04-29 VITALS — BP 151/95 | HR 80

## 2021-04-29 DIAGNOSIS — M48062 Spinal stenosis, lumbar region with neurogenic claudication: Secondary | ICD-10-CM | POA: Diagnosis not present

## 2021-04-29 DIAGNOSIS — M5416 Radiculopathy, lumbar region: Secondary | ICD-10-CM | POA: Diagnosis not present

## 2021-04-29 MED ORDER — METHYLPREDNISOLONE ACETATE 80 MG/ML IJ SUSP
80.0000 mg | Freq: Once | INTRAMUSCULAR | Status: AC
  Administered 2021-04-29: 80 mg

## 2021-04-29 NOTE — Patient Instructions (Signed)

## 2021-04-29 NOTE — Progress Notes (Signed)
Pt state lower back pain that travels to his right leg ans sometime his left. Pt state he can't walk due to the pressure. Pt state sitting and laying down makes the pain worse. Pt state he uses pain cream and takes over the counter pain meds to help ease his pain.  Numeric Pain Rating Scale and Functional Assessment Average Pain 4   In the last MONTH (on 0-10 scale) has pain interfered with the following?  1. General activity like being  able to carry out your everyday physical activities such as walking, climbing stairs, carrying groceries, or moving a chair?  Rating(10)   +Driver, -BT, -Dye Allergies.

## 2021-04-29 NOTE — Telephone Encounter (Signed)
I called [name] to review the PET scan results from 04/25/21.  Findings showed pathologic fracture with near complete marrow replacement of L4-L5.  No other bone lesions were identified.  No other PET avid findings.  Since serologic work-up was negative for plasma cell disorder and L4 bone biopsy was negative for malignancy, we do not recommend any further work-up at this time.  We do not see any evidence of malignancy based on our work-up.  I recommend for patient to follow-up with Dr. Durward Fortes in orthopedics to further discuss interventions for the pathologic fracture.  He is scheduled for an injection later today and he will plan to discuss the PET scan results as well.  Patient will return to our clinic if further diagnostic work-up is needed in the future.  He expressed understanding and satisfaction with the plan provided.

## 2021-05-01 ENCOUNTER — Ambulatory Visit (INDEPENDENT_AMBULATORY_CARE_PROVIDER_SITE_OTHER): Payer: Medicare PPO | Admitting: Neurology

## 2021-05-01 DIAGNOSIS — R252 Cramp and spasm: Secondary | ICD-10-CM | POA: Diagnosis not present

## 2021-05-02 NOTE — Procedures (Signed)
° ° °  History:  66 year old man with jerk like movement of the arm  EEG classification: Awake and drowsy  Description of the recording: The background rhythms of this recording consists of a fairly well modulated medium amplitude alpha rhythm of 7-8 Hz that is reactive to eye opening and closure. As the record progresses, the patient appears to remain in the waking state throughout the recording. Photic stimulation was performed, did not show any abnormalities. Hyperventilation was also performed, did not show any abnormalities. Toward the end of the recording, the patient enters the drowsy state with slight symmetric slowing seen. The patient never enters stage II sleep. No abnormal epileptiform discharges seen during this recording. There was no focal slowing. EKG monitor shows no evidence of cardiac rhythm abnormalities with a heart rate of 66.  Impression: This is an abnormal EEG recording in the waking and drowsy state due to mild diffuse slowing. Diffuse slowing is consistent with a generalized brain dysfunction such as in encephalopathy.  No seizure and no epileptiform discharges seen during this recording.    Alric Ran, MD [facility]

## 2021-05-06 LAB — FUNGAL ORGANISM REFLEX

## 2021-05-06 LAB — FUNGUS CULTURE WITH STAIN

## 2021-05-06 LAB — FUNGUS CULTURE RESULT

## 2021-05-09 ENCOUNTER — Ambulatory Visit (INDEPENDENT_AMBULATORY_CARE_PROVIDER_SITE_OTHER): Payer: Medicare PPO | Admitting: Family Medicine

## 2021-05-09 ENCOUNTER — Encounter: Payer: Self-pay | Admitting: Family Medicine

## 2021-05-09 VITALS — BP 126/88 | HR 82 | Wt 249.0 lb

## 2021-05-09 DIAGNOSIS — E1159 Type 2 diabetes mellitus with other circulatory complications: Secondary | ICD-10-CM | POA: Diagnosis not present

## 2021-05-09 DIAGNOSIS — E785 Hyperlipidemia, unspecified: Secondary | ICD-10-CM

## 2021-05-09 DIAGNOSIS — M5442 Lumbago with sciatica, left side: Secondary | ICD-10-CM

## 2021-05-09 DIAGNOSIS — E1149 Type 2 diabetes mellitus with other diabetic neurological complication: Secondary | ICD-10-CM

## 2021-05-09 DIAGNOSIS — Z23 Encounter for immunization: Secondary | ICD-10-CM

## 2021-05-09 DIAGNOSIS — Z9189 Other specified personal risk factors, not elsewhere classified: Secondary | ICD-10-CM

## 2021-05-09 DIAGNOSIS — S32050S Wedge compression fracture of fifth lumbar vertebra, sequela: Secondary | ICD-10-CM | POA: Diagnosis not present

## 2021-05-09 DIAGNOSIS — E1169 Type 2 diabetes mellitus with other specified complication: Secondary | ICD-10-CM

## 2021-05-09 DIAGNOSIS — M5441 Lumbago with sciatica, right side: Secondary | ICD-10-CM

## 2021-05-09 DIAGNOSIS — I152 Hypertension secondary to endocrine disorders: Secondary | ICD-10-CM

## 2021-05-09 LAB — BAYER DCA HB A1C WAIVED: HB A1C (BAYER DCA - WAIVED): 5.9 % — ABNORMAL HIGH (ref 4.8–5.6)

## 2021-05-09 MED ORDER — GABAPENTIN 400 MG PO CAPS: 800.0000 mg | ORAL_CAPSULE | Freq: Two times a day (BID) | ORAL | 3 refills | Status: DC

## 2021-05-09 MED ORDER — ATORVASTATIN CALCIUM 20 MG PO TABS: 20.0000 mg | ORAL_TABLET | Freq: Every day | ORAL | 3 refills | Status: DC

## 2021-05-09 MED ORDER — METFORMIN HCL ER 500 MG PO TB24
1000.0000 mg | ORAL_TABLET | Freq: Two times a day (BID) | ORAL | 3 refills | Status: DC
Start: 2021-05-09 — End: 2021-07-11

## 2021-05-09 MED ORDER — DULOXETINE HCL 30 MG PO CPEP: 30.0000 mg | ORAL_CAPSULE | Freq: Every day | ORAL | 3 refills | Status: DC

## 2021-05-09 NOTE — Progress Notes (Signed)
BP 126/88    Pulse 82    Wt 249 lb (112.9 kg)    SpO2 98%    BMI 31.12 kg/m    Subjective:   Patient ID: Ethan Price., male    DOB: 1956/02/13, 66 y.o.   MRN: [id number]  HPI: Ethan Komatsu. is a 66 y.o. male presenting on 05/09/2021 for Medical Management of Chronic Issues   HPI Back pain Patient is coming in today with back pain that has been worsening and shoots down his right leg.  He has compression of L4 vertebra that is known but it lit up on the MRI and then on the PET scan so they are concerned and he is going to a surgeon later this month to possibly get biopsy.  Type 2 diabetes mellitus Patient comes in today for recheck of his diabetes. Patient has been currently taking Financial controller and metformin and A1c is 5.9. Patient is not currently on an ACE inhibitor/ARB. Patient has seen an ophthalmologist this year. Patient denies any issues with their feet. The symptom started onset as an adult  ARE RELATED TO DM   Hypertension Patient is currently on no medication currently, monitoring, and their blood pressure today is 126/88. Patient denies any lightheadedness or dizziness. Patient denies headaches, blurred vision, chest pains, shortness of breath, or weakness. Denies any side effects from medication and is content with current medication.   Hyperlipidemia Patient is coming in for recheck of his hyperlipidemia. The patient is currently taking atorvastatin. They deny any issues with myalgias or history of liver damage from it. They deny any focal numbness or weakness or chest pain.   Relevant past medical, surgical, family and social history reviewed and updated as indicated. Interim medical history since our last visit reviewed. Allergies and medications reviewed and updated.  Review of Systems  Constitutional:  Negative for chills and fever.  Eyes:  Negative for visual disturbance.  Respiratory:  Negative for shortness of breath and wheezing.    Cardiovascular:  Negative for chest pain and leg swelling.  Musculoskeletal:  Positive for back pain and gait problem. Negative for arthralgias.  Skin:  Negative for rash.  Neurological:  Negative for dizziness, weakness and light-headedness.  All other systems reviewed and are negative.  Per HPI unless specifically indicated above   Allergies as of 05/09/2021       Reactions   Bee Venom         Medication List        Accurate as of May 09, 2021  3:48 PM. If you have any questions, ask your nurse or doctor.          aspirin EC 81 MG tablet Take 81 mg by mouth daily. Swallow whole.   atorvastatin 20 MG tablet Commonly known as: LIPITOR Take 1 tablet (20 mg total) by mouth daily.   baclofen 10 MG tablet Commonly known as: LIORESAL TAKE 1 TABLET AT BEDTIME AS NEEDED FOR MUSCLE SPASMS   Basaglar KwikPen 100 UNIT/ML Inject 34 Units into the skin daily.   diazepam 5 MG tablet Commonly known as: VALIUM Take one tablet by mouth with food one hour prior to procedure. May repeat 30 minutes prior if needed.   diclofenac Sodium 1 % Gel Commonly known as: VOLTAREN Apply topically 4 (four) times daily.   DULoxetine 30 MG capsule Commonly known as: Cymbalta Take 1 capsule (30 mg total) by mouth at bedtime.   gabapentin 400 MG capsule Commonly known as:  NEURONTIN Take 2 capsules (800 mg total) by mouth 2 (two) times daily.   GNP UltiCare Pen Needles 32G X 6 MM Misc Generic drug: Insulin Pen Needle EVERY DAY   metFORMIN 500 MG 24 hr tablet Commonly known as: GLUCOPHAGE-XR Take 2 tablets (1,000 mg total) by mouth 2 (two) times daily.   nitroGLYCERIN 0.4 MG SL tablet Commonly known as: NITROSTAT Place 0.4 mg under the tongue every 5 (five) minutes as needed for chest pain.   traMADol 50 MG tablet Commonly known as: Ultram Take 1 tablet (50 mg total) by mouth every 8 (eight) hours as needed for moderate pain or severe pain.   Trulicity 3 YN/8.2NF  Sopn Generic drug: Dulaglutide Inject 3 mg as directed once a week.         Objective:   BP 126/88    Pulse 82    Wt 249 lb (112.9 kg)    SpO2 98%    BMI 31.12 kg/m   Wt Readings from Last 3 Encounters:  05/09/21 249 lb (112.9 kg)  04/23/21 261 lb (118.4 kg)  04/04/21 261 lb 1.6 oz (118.4 kg)    Physical Exam Vitals and nursing note reviewed.  Constitutional:      General: He is not in acute distress.    Appearance: He is well-developed. He is not diaphoretic.  Eyes:     General: No scleral icterus.    Conjunctiva/sclera: Conjunctivae normal.  Neck:     Thyroid: No thyromegaly.  Cardiovascular:     Rate and Rhythm: Normal rate and regular rhythm.     Heart sounds: Normal heart sounds. No murmur heard. Pulmonary:     Effort: Pulmonary effort is normal. No respiratory distress.     Breath sounds: Normal breath sounds. No wheezing.  Musculoskeletal:        General: Normal range of motion.     Cervical back: Neck supple.  Lymphadenopathy:     Cervical: No cervical adenopathy.  Skin:    General: Skin is warm and dry.     Findings: No rash.  Neurological:     Mental Status: He is alert and oriented to person, place, and time.     Coordination: Coordination normal.  Psychiatric:        Behavior: Behavior normal.      Assessment & Plan:   Problem List Items Addressed This Visit       Cardiovascular and Mediastinum   Hypertension associated with diabetes ([place])   Relevant Medications   atorvastatin (LIPITOR) 20 MG tablet   metFORMIN (GLUCOPHAGE-XR) 500 MG 24 hr tablet     Endocrine   Type 2 diabetes mellitus with neurological complications ([facility])   Relevant Medications   atorvastatin (LIPITOR) 20 MG tablet   gabapentin (NEURONTIN) 400 MG capsule   metFORMIN (GLUCOPHAGE-XR) 500 MG 24 hr tablet   Other Relevant Orders   Bayer DCA Hb A1c Waived (Completed)   Hyperlipidemia associated with type 2 diabetes mellitus ([facility])   Relevant Medications   atorvastatin  (LIPITOR) 20 MG tablet   metFORMIN (GLUCOPHAGE-XR) 500 MG 24 hr tablet     Other   [place] cardiac risk >20% in next 10 years   Relevant Medications   atorvastatin (LIPITOR) 20 MG tablet   Low back pain   Other Visit Diagnoses     Closed compression fracture of L5 vertebra, sequela    -  Primary   Need for pneumococcal vaccination       Relevant Orders   Pneumococcal conjugate  vaccine 20-valent (Prevnar 20) (Completed)   Need for shingles vaccine       Relevant Orders   Pneumococcal conjugate vaccine 20-valent (Prevnar 20) (Completed)     Continue current medicine, diabetes blood pressure and everything looks good there.  Continue follow-up with back doctors.  Follow up plan: Return in about 3 months (around 08/07/2021), or if symptoms worsen or fail to improve, for Diabetes hypertension and .  Counseling provided for all of the vaccine components Orders Placed This Encounter  Procedures   Varicella-zoster vaccine IM (Shingrix)   Pneumococcal conjugate vaccine 20-valent (Prevnar 20)   Bayer DCA Hb A1c Waived    Caryl Pina, MD [facility] 05/09/2021, 3:48 PM

## 2021-05-12 ENCOUNTER — Other Ambulatory Visit: Payer: Self-pay | Admitting: Physical Medicine and Rehabilitation

## 2021-05-15 ENCOUNTER — Ambulatory Visit (INDEPENDENT_AMBULATORY_CARE_PROVIDER_SITE_OTHER): Payer: Medicare PPO

## 2021-05-15 VITALS — Ht 75.0 in | Wt 249.0 lb

## 2021-05-15 DIAGNOSIS — Z0000 Encounter for general adult medical examination without abnormal findings: Secondary | ICD-10-CM

## 2021-05-15 DIAGNOSIS — I2510 Atherosclerotic heart disease of native coronary artery without angina pectoris: Secondary | ICD-10-CM | POA: Insufficient documentation

## 2021-05-15 DIAGNOSIS — Z87828 Personal history of other (healed) physical injury and trauma: Secondary | ICD-10-CM | POA: Insufficient documentation

## 2021-05-15 NOTE — Patient Instructions (Signed)
[name] , Thank you for taking time to come for your Medicare Wellness Visit. I appreciate your ongoing commitment to your health goals. Please review the following plan we discussed and let me know if I can assist you in the future.   Screening recommendations/referrals: Colonoscopy: Done 04/29/2016 - Repeat in 10 years Recommended yearly ophthalmology/optometry visit for glaucoma screening and checkup Recommended yearly dental visit for hygiene and checkup  Vaccinations: Influenza vaccine: Done 03/13/2021 - Repeat annually  Pneumococcal vaccine: Done 02/01/2020 & 1/13/20232 Tdap vaccine: Done 02/01/2020 - Repeat in 10 years Shingles vaccine: Done 11/06/2020 & 05/09/2021   Covid-19: Declined  Advanced directives: Please bring a copy of your health care power of attorney and living will to the office to be added to your chart at your convenience.   Conditions/risks identified: Aim for 30 minutes of exercise each day - seated leg raises, arm lifts, stretches, etc, drink 6-8 glasses of water and eat lots of fruits and vegetables.   Next appointment: Follow up in one year for your annual wellness visit.   Preventive Care 67 Years and Older, Male  Preventive care refers to lifestyle choices and visits with your health care provider that can promote health and wellness. What does preventive care include? A yearly physical exam. This is also called an annual well check. Dental exams once or twice a year. Routine eye exams. Ask your health care provider how often you should have your eyes checked. Personal lifestyle choices, including: Daily care of your teeth and gums. Regular physical activity. Eating a healthy diet. Avoiding tobacco and drug use. Limiting alcohol use. Practicing safe sex. Taking low doses of aspirin every day. Taking vitamin and mineral supplements as recommended by your health care provider. What happens during an annual well check? The services and screenings done by  your health care provider during your annual well check will depend on your age, overall health, lifestyle risk factors, and family history of disease. Counseling  Your health care provider may ask you questions about your: Alcohol use. Tobacco use. Drug use. Emotional well-being. Home and relationship well-being. Sexual activity. Eating habits. History of falls. Memory and ability to understand (cognition). Work and work Statistician. Screening  You may have the following tests or measurements: Height, weight, and BMI. Blood pressure. Lipid and cholesterol levels. These may be checked every 5 years, or more frequently if you are over 90 years old. Skin check. Lung cancer screening. You may have this screening every year starting at age 5 if you have a 30-pack-year history of smoking and currently smoke or have quit within the past 15 years. Fecal occult blood test (FOBT) of the stool. You may have this test every year starting at age 33. Flexible sigmoidoscopy or colonoscopy. You may have a sigmoidoscopy every 5 years or a colonoscopy every 10 years starting at age 55. Prostate cancer screening. Recommendations will vary depending on your family history and other risks. Hepatitis C blood test. Hepatitis B blood test. Sexually transmitted disease (STD) testing. Diabetes screening. This is done by checking your blood sugar (glucose) after you have not eaten for a while (fasting). You may have this done every 1-3 years. Abdominal aortic aneurysm (AAA) screening. You may need this if you are a current or former smoker. Osteoporosis. You may be screened starting at age 54 if you are at high risk. Talk with your health care provider about your test results, treatment options, and if necessary, the need for more tests. Vaccines  Your health care provider may recommend certain vaccines, such as: Influenza vaccine. This is recommended every year. Tetanus, diphtheria, and acellular pertussis  (Tdap, Td) vaccine. You may need a Td booster every 10 years. Zoster vaccine. You may need this after age 76. Pneumococcal 13-valent conjugate (PCV13) vaccine. One dose is recommended after age 54. Pneumococcal polysaccharide (PPSV23) vaccine. One dose is recommended after age 57. Talk to your health care provider about which screenings and vaccines you need and how often you need them. This information is not intended to replace advice given to you by your health care provider. Make sure you discuss any questions you have with your health care provider. Document Released: 05/10/2015 Document Revised: 01/01/2016 Document Reviewed: 02/12/2015 Elsevier Interactive Patient Education  2017 [place] Prevention in the Home Falls can cause injuries. They can happen to people of all ages. There are many things you can do to make your home safe and to help prevent falls. What can I do on the outside of my home? Regularly fix the edges of walkways and driveways and fix any cracks. Remove anything that might make you trip as you walk through a door, such as a raised step or threshold. Trim any bushes or trees on the path to your home. Use bright outdoor lighting. Clear any walking paths of anything that might make someone trip, such as rocks or tools. Regularly check to see if handrails are loose or broken. Make sure that both sides of any steps have handrails. Any raised decks and porches should have guardrails on the edges. Have any leaves, snow, or ice cleared regularly. Use sand or salt on walking paths during winter. Clean up any spills in your garage right away. This includes oil or grease spills. What can I do in the bathroom? Use night lights. Install grab bars by the toilet and in the tub and shower. Do not use towel bars as grab bars. Use non-skid mats or decals in the tub or shower. If you need to sit down in the shower, use a plastic, non-slip stool. Keep the floor dry. Clean  up any water that spills on the floor as soon as it happens. Remove soap buildup in the tub or shower regularly. Attach bath mats securely with double-sided non-slip rug tape. Do not have throw rugs and other things on the floor that can make you trip. What can I do in the bedroom? Use night lights. Make sure that you have a light by your bed that is easy to reach. Do not use any sheets or blankets that are too big for your bed. They should not hang down onto the floor. Have a firm chair that has side arms. You can use this for support while you get dressed. Do not have throw rugs and other things on the floor that can make you trip. What can I do in the kitchen? Clean up any spills right away. Avoid walking on wet floors. Keep items that you use a lot in easy-to-reach places. If you need to reach something above you, use a strong step stool that has a grab bar. Keep electrical cords out of the way. Do not use floor polish or wax that makes floors slippery. If you must use wax, use non-skid floor wax. Do not have throw rugs and other things on the floor that can make you trip. What can I do with my stairs? Do not leave any items on the stairs. Make sure that there are  handrails on both sides of the stairs and use them. Fix handrails that are broken or loose. Make sure that handrails are as long as the stairways. Check any carpeting to make sure that it is firmly attached to the stairs. Fix any carpet that is loose or worn. Avoid having throw rugs at the top or bottom of the stairs. If you do have throw rugs, attach them to the floor with carpet tape. Make sure that you have a light switch at the top of the stairs and the bottom of the stairs. If you do not have them, ask someone to add them for you. What else can I do to help prevent falls? Wear shoes that: Do not have high heels. Have rubber bottoms. Are comfortable and fit you well. Are closed at the toe. Do not wear sandals. If you  use a stepladder: Make sure that it is fully opened. Do not climb a closed stepladder. Make sure that both sides of the stepladder are locked into place. Ask someone to hold it for you, if possible. Clearly mark and make sure that you can see: Any grab bars or handrails. First and last steps. Where the edge of each step is. Use tools that help you move around (mobility aids) if they are needed. These include: Canes. Walkers. Scooters. Crutches. Turn on the lights when you go into a dark area. Replace any light bulbs as soon as they burn out. Set up your furniture so you have a clear path. Avoid moving your furniture around. If any of your floors are uneven, fix them. If there are any pets around you, be aware of where they are. Review your medicines with your doctor. Some medicines can make you feel dizzy. This can increase your chance of falling. Ask your doctor what other things that you can do to help prevent falls. This information is not intended to replace advice given to you by your health care provider. Make sure you discuss any questions you have with your health care provider. Document Released: 02/07/2009 Document Revised: 09/19/2015 Document Reviewed: 05/18/2014 Elsevier Interactive Patient Education  2017 [employer].

## 2021-05-15 NOTE — Progress Notes (Signed)
Subjective:   [name]. is a 66 y.o. male who presents for an Initial Medicare Annual Wellness Visit.  Virtual Visit via Telephone Note  I connected with  [name]. on 05/15/21 at  3:30 PM EST by telephone and verified that I am speaking with the correct person using two identifiers.  Location: Patient: Home Provider: [facility] Persons participating in the virtual visit: patient/Nurse Health Advisor   I discussed the limitations, risks, security and privacy concerns of performing an evaluation and management service by telephone and the availability of in person appointments. The patient expressed understanding and agreed to proceed.  Interactive audio and video telecommunications were attempted between this nurse and patient, however failed, due to patient having technical difficulties OR patient did not have access to video capability.  We continued and completed visit with audio only.  Some vital signs may be absent or patient reported.   Demitrious Mccannon E Fredna Stricker, LPN   Review of Systems     Cardiac Risk Factors include: advanced age (>39men, >43 women);male gender;dyslipidemia;hypertension;diabetes mellitus;obesity (BMI >30kg/m2);sedentary lifestyle     Objective:    Today's Vitals   05/15/21 1527 05/15/21 1529  Weight: 249 lb (112.9 kg)   Height: 6\' 3"  (1.905 m)   PainSc:  6    Body mass index is 31.12 kg/m.  Advanced Directives 05/15/2021 03/27/2021 05/09/2020  Does Patient Have a Medical Advance Directive? No Yes Yes  Does patient want to make changes to medical advance directive? - No - Guardian declined -  Would patient like information on creating a medical advance directive? No - Patient declined No - Patient declined -    Current Medications (verified) Outpatient Encounter Medications as of 05/15/2021  Medication Sig   aspirin EC 81 MG tablet Take 81 mg by mouth daily. Swallow whole.   atorvastatin (LIPITOR) 20 MG tablet Take 1 tablet (20 mg total) by  mouth daily.   baclofen (LIORESAL) 10 MG tablet TAKE 1 TABLET AT BEDTIME AS NEEDED FOR MUSCLE SPASMS   diazepam (VALIUM) 5 MG tablet Take one tablet by mouth with food one hour prior to procedure. May repeat 30 minutes prior if needed.   diclofenac Sodium (VOLTAREN) 1 % GEL Apply topically 4 (four) times daily.   Dulaglutide (TRULICITY) 3 KG/2.5KY SOPN Inject 3 mg as directed once a week.   DULoxetine (CYMBALTA) 30 MG capsule Take 1 capsule (30 mg total) by mouth at bedtime.   gabapentin (NEURONTIN) 400 MG capsule Take 2 capsules (800 mg total) by mouth 2 (two) times daily.   GNP ULTICARE PEN NEEDLES 32G X 6 MM MISC EVERY DAY   Insulin Glargine (BASAGLAR KWIKPEN) 100 UNIT/ML Inject 34 Units into the skin daily.   metFORMIN (GLUCOPHAGE-XR) 500 MG 24 hr tablet Take 2 tablets (1,000 mg total) by mouth 2 (two) times daily.   nitroGLYCERIN (NITROSTAT) 0.4 MG SL tablet Place 0.4 mg under the tongue every 5 (five) minutes as needed for chest pain.   traMADol (ULTRAM) 50 MG tablet Take 1 tablet (50 mg total) by mouth every 8 (eight) hours as needed for moderate pain or severe pain.   Facility-Administered Encounter Medications as of 05/15/2021  Medication   methylPREDNISolone acetate (DEPO-MEDROL) injection 80 mg    Allergies (verified) Bee venom   History: Past Medical History:  Diagnosis Date   Cataract    Diabetes mellitus without complication ([place])    Neuropathy    Past Surgical History:  Procedure Laterality Date   ANKLE  SURGERY Right    BACK SURGERY     EYE SURGERY     cateracts   GASTROPLASTY  1978   IR FLUORO GUIDED NEEDLE PLC ASPIRATION/INJECTION LOC  03/27/2021   TOTAL HIP ARTHROPLASTY Bilateral    Family History  Problem Relation Age of Onset   Diabetes Mother    Heart disease Mother    Uterine cancer Mother    Heart disease Father    Diabetes Father    Social History   Socioeconomic History   Marital status: Married    Spouse name: Anderson Malta   Number of children:  Not on file   Years of education: Not on file   Highest education level: Not on file  Occupational History   Occupation: retired  Tobacco Use   Smoking status: Never   Smokeless tobacco: Never  Substance and Sexual Activity   Alcohol use: Never   Drug use: Never   Sexual activity: Yes    Comment: married for 2 years, widowed twice, 10 children  Other Topics Concern   Not on file  Social History Narrative   Children out of town   Lots of family nearby   Social Determinants of Health   Financial Resource Strain: Low Risk    Difficulty of Paying Living Expenses: Not hard at all  Food Insecurity: No Food Insecurity   Worried About Charity fundraiser in the Last Year: Never true   Arboriculturist in the Last Year: Never true  Transportation Needs: No Transportation Needs   Lack of Transportation (Medical): No   Lack of Transportation (Non-Medical): No  Physical Activity: Inactive   Days of Exercise per Week: 0 days   Minutes of Exercise per Session: 0 min  Stress: No Stress Concern Present   Feeling of Stress : Only a little  Social Connections: Engineer, building services of Communication with Friends and Family: More than three times a week   Frequency of Social Gatherings with Friends and Family: Twice a week   Attends Religious Services: More than 4 times per year   Active Member of [employer] or Organizations: Yes   Attends Music therapist: More than 4 times per year   Marital Status: Married    Tobacco Counseling Counseling given: Not Answered   Clinical Intake:  Pre-visit preparation completed: Yes  Pain : 0-10 Pain Score: 6  Pain Type: Chronic pain Pain Location: Leg Pain Orientation: Right Pain Descriptors / Indicators: Aching, Sharp, Tender Pain Onset: More than a month ago Pain Frequency: Intermittent     BMI - recorded: 31.12 Nutritional Status: BMI > 30  Obese Nutritional Risks: None Diabetes: Yes CBG done?: No Did pt. bring in  CBG monitor from home?: No  How often do you need to have someone help you when you read instructions, pamphlets, or other written materials from your doctor or pharmacy?: 1 - Never  Diabetic? Nutrition Risk Assessment:  Has the patient had any N/V/D within the last 2 months?  No  Does the patient have any non-healing wounds?  No  Has the patient had any unintentional weight loss or weight gain?  No   Diabetes:  Is the patient diabetic?  Yes  If diabetic, was a CBG obtained today?  No  Did the patient bring in their glucometer from home?  No  How often do you monitor your CBG's? 2-3 x per day.   Financial Strains and Diabetes Management:  Are you having any financial strains  with the device, your supplies or your medication? No .  Does the patient want to be seen by Chronic Care Management for management of their diabetes?  No  Would the patient like to be referred to a Nutritionist or for Diabetic Management?  No   Diabetic Exams:  Diabetic Eye Exam: Completed 01/06/2021.   Diabetic Foot Exam: Completed 05/09/2021. Pt has been advised about the importance in completing this exam. Pt is scheduled for diabetic foot exam on next year.    Interpreter Needed?: No  Information entered by :: Dori Devino, LPN   Activities of Daily Living In your present state of health, do you have any difficulty performing the following activities: 05/15/2021  Hearing? N  Vision? N  Difficulty concentrating or making decisions? Y  Walking or climbing stairs? Y  Dressing or bathing? N  Doing errands, shopping? Y  Comment has trouble driving right now  Preparing Food and eating ? N  Using the Toilet? N  In the past six months, have you accidently leaked urine? N  Do you have problems with loss of bowel control? N  Managing your Medications? N  Managing your Finances? N  Housekeeping or managing your Housekeeping? Y  Some recent data might be hidden    Patient Care Team: Dettinger, Fransisca Kaufmann,  MD as PCP - General (Family Medicine) Lavera Guise, [facility] (Pharmacist)  Indicate any recent Medical Services you may have received from other than [facility] providers in the past year (date may be approximate).     Assessment:   This is a routine wellness examination for [place].  Hearing/Vision screen Hearing Screening - Comments:: Denies hearing difficulties Vision Screening - Comments:: Up to date with annual eye exams with [facility] in [place] - wears rx glasses  Dietary issues and exercise activities discussed: Current Exercise Habits: The patient does not participate in regular exercise at present, Exercise limited by: orthopedic condition(s);neurologic condition(s)   Goals Addressed             This Visit's Progress    Patient Stated       I want relief from this back pain and be able to be active again.       Depression Screen PHQ 2/9 Scores 05/15/2021 05/09/2021 02/06/2021 01/13/2021 11/06/2020 07/15/2020 02/01/2020  PHQ - 2 Score 0 0 0 0 0 0 0    Fall Risk Fall Risk  05/15/2021 05/09/2021 03/13/2021 02/06/2021 01/13/2021  Falls in the past year? 1 1 1 1  0  Number falls in past yr: 1 1 0 0 -  Injury with Fall? 0 0 0 0 -  Risk for fall due to : History of fall(s);Impaired balance/gait;Impaired mobility;Medication side effect;Mental status change;Orthopedic patient History of fall(s);Impaired balance/gait Impaired balance/gait Impaired balance/gait -  Follow up Education provided;Falls prevention discussed Falls evaluation completed Falls evaluation completed Falls evaluation completed -    FALL RISK PREVENTION PERTAINING TO THE HOME:  Any stairs in or around the home? No  If so, are there any without handrails? No  Home free of loose throw rugs in walkways, pet beds, electrical cords, etc? Yes  Adequate lighting in your home to reduce risk of falls? Yes   ASSISTIVE DEVICES UTILIZED TO PREVENT FALLS:  Life alert? No  Use of a cane, walker or w/c? Yes  Grab bars in  the bathroom? Yes  Shower chair or bench in shower? Yes  Elevated toilet seat or a handicapped toilet? Yes   TIMED UP AND GO:  Was the test performed? No . Telephonic visit  Cognitive Function: c/o memory loss and trouble focusing - he thinks a lot of this is due to his severe pain.     6CIT Screen 05/15/2021  What Year? 0 points  What month? 0 points  What time? 0 points  Count back from 20 0 points  Months in reverse 2 points  Repeat phrase 2 points  Total Score 4    Immunizations Immunization History  Administered Date(s) Administered   Fluad Quad(high Dose 65+) 03/13/2021   PNEUMOCOCCAL CONJUGATE-20 05/09/2021   Pneumococcal Conjugate-13 02/01/2020   Tdap 02/01/2020   Zoster Recombinat (Shingrix) 11/06/2020, 05/09/2021    TDAP status: Up to date  Flu Vaccine status: Up to date  Pneumococcal vaccine status: Up to date  Covid-19 vaccine status: Declined, Education has been provided regarding the importance of this vaccine but patient still declined. Advised may receive this vaccine at local pharmacy or Health Dept.or vaccine clinic. Aware to provide a copy of the vaccination record if obtained from local pharmacy or Health Dept. Verbalized acceptance and understanding.  Qualifies for Shingles Vaccine? Yes   Zostavax completed Yes   Shingrix Completed?: Yes  Screening Tests Health Maintenance  Topic Date Due   URINE MICROALBUMIN  08/18/2019   COVID-19 Vaccine (1) 05/25/2021 (Originally 01/17/1956)   HEMOGLOBIN A1C  11/06/2021   OPHTHALMOLOGY EXAM  01/06/2022   FOOT EXAM  05/09/2022   COLONOSCOPY (Pts 45-38yrs Insurance coverage will need to be confirmed)  04/29/2026   TETANUS/TDAP  01/31/2030   Pneumonia Vaccine 48+ Years old  Completed   INFLUENZA VACCINE  Completed   Zoster Vaccines- Shingrix  Completed   HPV VACCINES  Aged Out   Hepatitis C Screening  Discontinued    Health Maintenance  Health Maintenance Due  Topic Date Due   URINE MICROALBUMIN   08/18/2019    Colorectal cancer screening: Type of screening: Colonoscopy. Completed 04/29/2016. Repeat every 10 years  Lung Cancer Screening: (Low Dose CT Chest recommended if Age 67-80 years, 30 pack-year currently smoking OR have quit w/in 15years.) does not qualify.   Additional Screening:  Hepatitis C Screening: does qualify; Declined  Vision Screening: Recommended annual ophthalmology exams for early detection of glaucoma and other disorders of the eye. Is the patient up to date with their annual eye exam?  Yes  Who is the provider or what is the name of the office in which the patient attends annual eye exams? [place] If pt is not established with a provider, would they like to be referred to a provider to establish care? No .   Dental Screening: Recommended annual dental exams for proper oral hygiene  Community Resource Referral / Chronic Care Management: CRR required this visit?  No   CCM required this visit?  No      Plan:     I have personally reviewed and noted the following in the patients chart:   Medical and social history Use of alcohol, tobacco or illicit drugs  Current medications and supplements including opioid prescriptions. Patient is currently taking opioid prescriptions. Information provided to patient regarding non-opioid alternatives. Patient advised to discuss non-opioid treatment plan with their provider. Functional ability and status Nutritional status Physical activity Advanced directives List of other physicians Hospitalizations, surgeries, and ER visits in previous 12 months Vitals Screenings to include cognitive, depression, and falls Referrals and appointments  In addition, I have reviewed and discussed with patient certain preventive protocols, quality metrics, and best practice  recommendations. A written personalized care plan for preventive services as well as general preventive health recommendations were provided to  patient.   Due to this being a telephonic visit, the after visit summary with patients personalized plan was offered to patient via mail or my-chart. Patient would like to access on my-chart/  Sandrea Hammond, LPN   01/08/4457   Nurse Notes: none

## 2021-05-19 ENCOUNTER — Telehealth: Payer: Self-pay | Admitting: Family Medicine

## 2021-05-19 ENCOUNTER — Other Ambulatory Visit: Payer: Self-pay | Admitting: Physical Medicine and Rehabilitation

## 2021-05-19 DIAGNOSIS — M48062 Spinal stenosis, lumbar region with neurogenic claudication: Secondary | ICD-10-CM

## 2021-05-19 MED ORDER — DULOXETINE HCL 30 MG PO CPEP: 30.0000 mg | ORAL_CAPSULE | Freq: Every day | ORAL | 3 refills | Status: DC

## 2021-05-19 NOTE — Telephone Encounter (Signed)
Spouse called. Cancel DULoxetine (CYMBALTA) 30 MG capsule [facility] and send in to [facility] mail order.

## 2021-05-19 NOTE — Telephone Encounter (Signed)
Canceled at Drug Store and sent to [facility]

## 2021-05-20 ENCOUNTER — Ambulatory Visit (INDEPENDENT_AMBULATORY_CARE_PROVIDER_SITE_OTHER): Payer: Medicare PPO | Admitting: Orthopaedic Surgery

## 2021-05-20 ENCOUNTER — Encounter: Payer: Self-pay | Admitting: Orthopaedic Surgery

## 2021-05-20 ENCOUNTER — Other Ambulatory Visit: Payer: Self-pay

## 2021-05-20 VITALS — Ht 75.0 in | Wt 249.0 lb

## 2021-05-20 DIAGNOSIS — M5441 Lumbago with sciatica, right side: Secondary | ICD-10-CM

## 2021-05-20 DIAGNOSIS — M5442 Lumbago with sciatica, left side: Secondary | ICD-10-CM
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
## 2021-05-21 ENCOUNTER — Telehealth: Payer: Self-pay | Admitting: Family Medicine

## 2021-05-21 NOTE — Telephone Encounter (Signed)
Is there anywhere that can get him in sooner?

## 2021-05-21 NOTE — Telephone Encounter (Signed)
Pt called stating that he had a visit with Dr Dettinger on 05/09/21 and was told that Dr Dettinger would send a referral for pt to see a Neurologist for his falls.  Says he was sent to see Dr Brett Fairy but says they cant see him until March.  Needs Dr Dettinger to send his referral to [facility] or anywhere as long as he can be seen soon.

## 2021-05-22 NOTE — Telephone Encounter (Signed)
Pt has been informed of Dettinger's recommendations.  He will call current neuro and see if they have any cancellations and make sure he is on a cancellation list. He may also try other offices to see if they a a NP appt sooner.

## 2021-05-22 NOTE — Telephone Encounter (Signed)
That referral that was most recently placed look like it was placed by somebody else in internal medicine, I would recommend for him to keep that appointment because I do not think that we would be able to get him anywhere sooner than March.  They are typically booked out.  If he calls around to different neurology's office and find 1 that sooner than I am more than happy to place that referral but I do not think that anywhere would get him in sooner than March

## 2021-05-23 ENCOUNTER — Telehealth: Payer: Self-pay | Admitting: Orthopaedic Surgery

## 2021-05-23 NOTE — Telephone Encounter (Signed)
05/20/21 ov note faxed to [facility] care to support need for order for Light Weight Wheelchair Dr. Chana Bode wrote [phone]

## 2021-05-25 NOTE — Progress Notes (Signed)
[name]. - 66 y.o. male MRN [id number]  Date of birth: 10-03-1955  Office Visit Note: Visit Date: 04/29/2021 PCP: Dettinger, Fransisca Kaufmann, MD Referred by: Dettinger, Fransisca Kaufmann, MD  Subjective: Chief Complaint  Patient presents with   Lower Back - Pain   Right Leg - Pain   HPI:  [name]. is a 66 y.o. male who comes in today at the request of Barnet Pall, FNP for planned Right L4-5 and L5-S1 Lumbar Transforaminal epidural steroid injection with fluoroscopic guidance.  The patient has failed conservative care including home exercise, medications, time and activity modification.  This injection will be diagnostic and hopefully therapeutic.  Please see requesting physician notes for further details and justification.  ROS Otherwise per HPI.  Assessment & Plan: Visit Diagnoses:    ICD-10-CM   1. Lumbar radiculopathy  M54.16 XR C-ARM NO REPORT    Epidural Steroid injection    methylPREDNISolone acetate (DEPO-MEDROL) injection 80 mg    2. Spinal stenosis of lumbar region with neurogenic claudication  M48.062 XR C-ARM NO REPORT    Epidural Steroid injection    methylPREDNISolone acetate (DEPO-MEDROL) injection 80 mg      Plan: No additional findings.   Meds & Orders:  Meds ordered this encounter  Medications   methylPREDNISolone acetate (DEPO-MEDROL) injection 80 mg    Orders Placed This Encounter  Procedures   XR C-ARM NO REPORT   Epidural Steroid injection    Follow-up: Return if symptoms worsen or fail to improve.   Procedures: No procedures performed  Lumbosacral Transforaminal Epidural Steroid Injection - Sub-Pedicular Approach with Fluoroscopic Guidance  Patient: [name].      Date of Birth: Nov 09, 1955 MRN: [id number] PCP: Dettinger, Fransisca Kaufmann, MD      Visit Date: 04/29/2021   Universal Protocol:    Date/Time: 04/29/2021  Consent Given By: the patient  Position: PRONE  Additional Comments: Vital signs were monitored before and  after the procedure. Patient was prepped and draped in the usual sterile fashion. The correct patient, procedure, and site was verified.   Injection Procedure Details:   Procedure diagnoses: Lumbar radiculopathy [M54.16]    Meds Administered:  Meds ordered this encounter  Medications   methylPREDNISolone acetate (DEPO-MEDROL) injection 80 mg    Laterality: Right  Location/Site: L4 and L5  Needle:5.0 in., 22 ga.  Short bevel or Quincke spinal needle  Needle Placement: Transforaminal  Findings:    -Comments: Excellent flow of contrast along the nerve, nerve root and into the epidural space.  Procedure Details: After squaring off the end-plates to get a true AP view, the C-arm was positioned so that an oblique view of the foramen as noted above was visualized. The target area is just inferior to the "nose of the scotty dog" or sub pedicular. The soft tissues overlying this structure were infiltrated with 2-3 ml. of 1% Lidocaine without Epinephrine.  The spinal needle was inserted toward the target using a "trajectory" view along the fluoroscope beam.  Under AP and lateral visualization, the needle was advanced so it did not puncture dura and was located close the 6 O'Clock position of the pedical in AP tracterory. Biplanar projections were used to confirm position. Aspiration was confirmed to be negative for CSF and/or blood. A 1-2 ml. volume of Isovue-250 was injected and flow of contrast was noted at each level. Radiographs were obtained for documentation purposes.   After attaining the desired flow of contrast documented above, a 0.5  to 1.0 ml test dose of 0.25% Marcaine was injected into each respective transforaminal space.  The patient was observed for 90 seconds post injection.  After no sensory deficits were reported, and normal lower extremity motor function was noted,   the above injectate was administered so that equal amounts of the injectate were placed at each foramen  (level) into the transforaminal epidural space.   Additional Comments:  The patient tolerated the procedure well Dressing: 2 x 2 sterile gauze and Band-Aid    Post-procedure details: Patient was observed during the procedure. Post-procedure instructions were reviewed.  Patient left the clinic in stable condition.     Clinical History: MRI LUMBAR SPINE WITHOUT CONTRAST   TECHNIQUE: Multiplanar, multisequence MR imaging of the lumbar spine was performed. No intravenous contrast was administered.   COMPARISON:  MRI 09/02/2006.  X-ray 03/05/2021   FINDINGS: Technical Note: Despite efforts by the technologist and patient, motion artifact is present on today's exam and could not be eliminated. This reduces exam sensitivity and specificity.   Segmentation:  Standard.   Alignment:  No significant static listhesis.   Vertebrae: Postsurgical changes from posterior spinal fusion at L1 through L4. The L2 and L3 vertebral bodies are fused. Remote well-healed fracture deformity of L3. Unchanged superior endplate deformity at L2, which may be posttraumatic or represent a Schmorl's node. No evidence of discitis.   Pathologic fracture of the L4 vertebral body involving the superior and inferior endplates with up to 68% vertebral body height loss centrally. There is diffuse confluent low T1 marrow signal throughout the L4 vertebral body with T2/STIR hyperintense signal. Subtle expansion of the posterior wall, which may be secondary to underlying lesion and/or compression. No extraosseous soft tissue component is identified. No additional marrow replacing bone lesions are seen within the field of view.   Conus medullaris and cauda equina: Conus extends to the T12-L1 level. Conus and cauda equina appear normal. No abnormalities are evident within the epidural space.   Paraspinal and other soft tissues: Negative.   Disc levels:   T12-L1: Shallow left paracentral disc protrusion.  Mild facet hypertrophy. Mild left foraminal stenosis. No canal or right foraminal stenosis. Unchanged.   L1-L2: Prior fusion.  No evidence of foraminal or canal stenosis.   L2-L3: Prior fusion.  No evidence of foraminal or canal stenosis.   L3-L4: Prior fusion. There may be mild residual bilateral foraminal stenosis. No canal stenosis.   L4-L5: Mild annular disc bulge. Mild-moderate bilateral facet arthropathy and ligamentum flavum buckling. Findings result in mild-to-moderate canal stenosis with moderate-severe bilateral foraminal stenosis. Findings have progressed from prior.   L5-S1: Mild annular disc bulge. Moderate bilateral facet arthropathy. No foraminal or canal stenosis.   IMPRESSION: 1. Pathologic fracture of the L4 vertebral body with up to 40% vertebral body height loss centrally. Diffuse marrow replacement throughout the vertebral body with appearance concerning for metastatic disease or myeloma. Lymphoma could also have this appearance. Further workup including hematologic testing is recommended. Nuclear medicine whole-body bone scan could be considered to assess for additional sites of bony involvement. 2. Progression of degenerative changes at L4-L5 with mild-to-moderate canal stenosis and moderate-severe bilateral foraminal stenosis. 3. Prior posterior spinal fusion at L1 through L4. No canal stenosis at the operative levels. Mild residual bilateral foraminal stenosis at L3-L4.   These results will be called to the ordering clinician or representative by the Radiologist Assistant, and communication documented in the PACS or [employer].     Electronically Signed   By:  Nicholas  Plundo D.O.   On: 03/07/2021 13:01     Objective:  VS:  HT:     WT:    BMI:      BP:(!) 151/95   HR:80bpm   TEMP: ( )   RESP:  Physical Exam Vitals and nursing note reviewed.  Constitutional:      General: He is not in acute distress.    Appearance: Normal appearance. He  is not ill-appearing.  HENT:     Head: Normocephalic and atraumatic.     Right Ear: External ear normal.     Left Ear: External ear normal.     Nose: No congestion.  Eyes:     Extraocular Movements: Extraocular movements intact.  Cardiovascular:     Rate and Rhythm: Normal rate.     Pulses: Normal pulses.  Pulmonary:     Effort: Pulmonary effort is normal. No respiratory distress.  Abdominal:     General: There is no distension.     Palpations: Abdomen is soft.  Musculoskeletal:        General: No tenderness or signs of injury.     Cervical back: Neck supple.     Right lower leg: No edema.     Left lower leg: No edema.     Comments: Patient has good distal strength without clonus.  Skin:    Findings: No erythema or rash.  Neurological:     General: No focal deficit present.     Mental Status: He is alert and oriented to person, place, and time.     Sensory: No sensory deficit.     Motor: No weakness or abnormal muscle tone.     Coordination: Coordination normal.  Psychiatric:        Mood and Affect: Mood normal.        Behavior: Behavior normal.     Imaging: No results found.

## 2021-05-25 NOTE — Procedures (Signed)
Lumbosacral Transforaminal Epidural Steroid Injection - Sub-Pedicular Approach with Fluoroscopic Guidance  Patient: [name].      Date of Birth: 11-28-1955 MRN: [id number] PCP: Dettinger, Fransisca Kaufmann, MD      Visit Date: 04/29/2021   Universal Protocol:    Date/Time: 04/29/2021  Consent Given By: the patient  Position: PRONE  Additional Comments: Vital signs were monitored before and after the procedure. Patient was prepped and draped in the usual sterile fashion. The correct patient, procedure, and site was verified.   Injection Procedure Details:   Procedure diagnoses: Lumbar radiculopathy [M54.16]    Meds Administered:  Meds ordered this encounter  Medications   methylPREDNISolone acetate (DEPO-MEDROL) injection 80 mg    Laterality: Right  Location/Site: L4 and L5  Needle:5.0 in., 22 ga.  Short bevel or Quincke spinal needle  Needle Placement: Transforaminal  Findings:    -Comments: Excellent flow of contrast along the nerve, nerve root and into the epidural space.  Procedure Details: After squaring off the end-plates to get a true AP view, the C-arm was positioned so that an oblique view of the foramen as noted above was visualized. The target area is just inferior to the "nose of the scotty dog" or sub pedicular. The soft tissues overlying this structure were infiltrated with 2-3 ml. of 1% Lidocaine without Epinephrine.  The spinal needle was inserted toward the target using a "trajectory" view along the fluoroscope beam.  Under AP and lateral visualization, the needle was advanced so it did not puncture dura and was located close the 6 O'Clock position of the pedical in AP tracterory. Biplanar projections were used to confirm position. Aspiration was confirmed to be negative for CSF and/or blood. A 1-2 ml. volume of Isovue-250 was injected and flow of contrast was noted at each level. Radiographs were obtained for documentation purposes.   After attaining  the desired flow of contrast documented above, a 0.5 to 1.0 ml test dose of 0.25% Marcaine was injected into each respective transforaminal space.  The patient was observed for 90 seconds post injection.  After no sensory deficits were reported, and normal lower extremity motor function was noted,   the above injectate was administered so that equal amounts of the injectate were placed at each foramen (level) into the transforaminal epidural space.   Additional Comments:  The patient tolerated the procedure well Dressing: 2 x 2 sterile gauze and Band-Aid    Post-procedure details: Patient was observed during the procedure. Post-procedure instructions were reviewed.  Patient left the clinic in stable condition.

## 2021-05-26 ENCOUNTER — Telehealth: Payer: Self-pay | Admitting: Orthopaedic Surgery

## 2021-05-26 ENCOUNTER — Telehealth: Payer: Self-pay | Admitting: Physical Medicine and Rehabilitation

## 2021-05-26 NOTE — Telephone Encounter (Signed)
[place] spoke with Velna Hatchet advised her the office note was faxed to their office on 05/20/2021 supporting the need for the lightweight wheel chair. Brandi advised more information is needed to support the need for the light weight wheel chair. She is faxing over the request  for an addendum to the office note from the doctor. The number to contact Velna Hatchet is [phone]

## 2021-05-26 NOTE — Telephone Encounter (Signed)
Pt called requesting a refill of tramadol. Pt is asking for a call when medication has been sent to pharmacy. Please send to pharmacy on file. Pt phone number is [phone].

## 2021-05-27 ENCOUNTER — Telehealth: Payer: Self-pay | Admitting: Orthopaedic Surgery

## 2021-05-27 NOTE — Telephone Encounter (Signed)
Patient lmvm about the wheelchair Dr. Durward Fortes wrote an order for him. He states [facility] needs more information. Please call pt [phone]

## 2021-05-27 NOTE — Telephone Encounter (Signed)
Called patient. He states that [facility] has [facility]Whitfield information that needs to be filled out. I will check with Dr.Whitfield tomorrow when he comes into the office and let patient know.

## 2021-05-28 ENCOUNTER — Encounter: Payer: Self-pay | Admitting: Physical Medicine and Rehabilitation

## 2021-05-28 ENCOUNTER — Other Ambulatory Visit: Payer: Self-pay

## 2021-05-28 ENCOUNTER — Telehealth: Payer: Self-pay | Admitting: Orthopaedic Surgery

## 2021-05-28 ENCOUNTER — Ambulatory Visit (INDEPENDENT_AMBULATORY_CARE_PROVIDER_SITE_OTHER): Payer: Medicare PPO | Admitting: Physical Medicine and Rehabilitation

## 2021-05-28 VITALS — BP 154/87 | HR 88

## 2021-05-28 DIAGNOSIS — M48062 Spinal stenosis, lumbar region with neurogenic claudication: Secondary | ICD-10-CM | POA: Diagnosis not present

## 2021-05-28 DIAGNOSIS — R296 Repeated falls: Secondary | ICD-10-CM

## 2021-05-28 DIAGNOSIS — M5416 Radiculopathy, lumbar region: Secondary | ICD-10-CM | POA: Diagnosis not present

## 2021-05-28 DIAGNOSIS — M8448XA Pathological fracture, other site, initial encounter for fracture: Secondary | ICD-10-CM

## 2021-05-28 DIAGNOSIS — M47816 Spondylosis without myelopathy or radiculopathy, lumbar region: Secondary | ICD-10-CM | POA: Diagnosis not present

## 2021-05-28 MED ORDER — DIAZEPAM 5 MG PO TABS
ORAL_TABLET | ORAL | 0 refills | Status: DC
Start: 2021-05-28 — End: 2021-07-07

## 2021-05-28 MED ORDER — TRAMADOL HCL 50 MG PO TABS
50.0000 mg | ORAL_TABLET | Freq: Two times a day (BID) | ORAL | 0 refills | Status: DC | PRN
Start: 2021-05-28 — End: 2021-07-07

## 2021-05-28 NOTE — Progress Notes (Signed)
Pt state lower back pain that travels to his right hip and leg. Pt state the worse pain is in his right shin. Pt state he fell a week ago and he also has neck pain. Pt has hx of inj 04/29/21 pt state it helped sum but he pain come back worse at night in his back.  Numeric Pain Rating Scale and Functional Assessment Average Pain 8   In the last MONTH (on 0-10 scale) has pain interfered with the following?  1. General activity like being  able to carry out your everyday physical activities such as walking, climbing stairs, carrying groceries, or moving a chair?  Rating(10)

## 2021-05-28 NOTE — Telephone Encounter (Signed)
05/20/21 ov with addendum faxed to [facility]
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
## 2021-05-28 NOTE — Telephone Encounter (Signed)
Forms have been filled out. Tammy will fax to [facility].

## 2021-06-02 ENCOUNTER — Encounter: Payer: Self-pay | Admitting: Neurology

## 2021-06-02 ENCOUNTER — Ambulatory Visit (INDEPENDENT_AMBULATORY_CARE_PROVIDER_SITE_OTHER): Payer: Medicare PPO | Admitting: Neurology

## 2021-06-02 VITALS — BP 128/85 | HR 88

## 2021-06-02 DIAGNOSIS — M5412 Radiculopathy, cervical region: Secondary | ICD-10-CM | POA: Diagnosis not present

## 2021-06-02 DIAGNOSIS — G253 Myoclonus: Secondary | ICD-10-CM

## 2021-06-02 DIAGNOSIS — W19XXXD Unspecified fall, subsequent encounter: Secondary | ICD-10-CM
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
## 2021-06-05 ENCOUNTER — Other Ambulatory Visit: Payer: Self-pay

## 2021-06-05 ENCOUNTER — Telehealth: Payer: Self-pay | Admitting: Neurology

## 2021-06-05 ENCOUNTER — Ambulatory Visit (INDEPENDENT_AMBULATORY_CARE_PROVIDER_SITE_OTHER): Payer: Medicare PPO | Admitting: Physical Medicine and Rehabilitation

## 2021-06-05 ENCOUNTER — Ambulatory Visit: Payer: Self-pay

## 2021-06-05 ENCOUNTER — Encounter: Payer: Self-pay | Admitting: Physical Medicine and Rehabilitation

## 2021-06-05 VITALS — BP 124/82 | HR 78

## 2021-06-05 DIAGNOSIS — M5416 Radiculopathy, lumbar region: Secondary | ICD-10-CM

## 2021-06-05 MED ORDER — METHYLPREDNISOLONE ACETATE 80 MG/ML IJ SUSP
80.0000 mg | Freq: Once | INTRAMUSCULAR | Status: AC
  Administered 2021-06-05: 80 mg

## 2021-06-05 NOTE — Patient Instructions (Signed)
Cervical spine MRI  Thyroid panel  Vitamin B12  Paraneoplastic panel  Referral to physical therapy for gait training

## 2021-06-05 NOTE — Patient Instructions (Signed)

## 2021-06-05 NOTE — Progress Notes (Signed)
Pt state lower back pain that travels to his right hip and leg. Pt state the worse pain is in his right shin. Pt state he fell a week ago and he also has neck pain  Numeric Pain Rating Scale and Functional Assessment Average Pain 9   In the last MONTH (on 0-10 scale) has pain interfered with the following?  1. General activity like being  able to carry out your everyday physical activities such as walking, climbing stairs, carrying groceries, or moving a chair?  Rating(10)   +Driver, -BT, -Dye Allergies.

## 2021-06-05 NOTE — Telephone Encounter (Signed)
[name] [name]: [phone] (exp. 06/05/21 to 07/05/21), I sent a message to [name] at [facility] to scheduled the patient as soon as possible.

## 2021-06-08 NOTE — Procedures (Signed)
Lumbar Epidural Steroid Injection - Interlaminar Approach with Fluoroscopic Guidance  Patient: [name].      Date of Birth: Sep 04, 1955 MRN: [id number] PCP: Dettinger, Fransisca Kaufmann, MD      Visit Date: 06/05/2021   Universal Protocol:     Consent Given By: the patient  Position: PRONE  Additional Comments: Vital signs were monitored before and after the procedure. Patient was prepped and draped in the usual sterile fashion. The correct patient, procedure, and site was verified.   Injection Procedure Details:   Procedure diagnoses: Lumbar radiculopathy [M54.16]   Meds Administered:  Meds ordered this encounter  Medications   methylPREDNISolone acetate (DEPO-MEDROL) injection 80 mg     Laterality: Right  Location/Site:  L5-S1  Needle: 3.5 in., 20 ga. Tuohy  Needle Placement: Paramedian epidural  Findings:   -Comments: Excellent flow of contrast into the epidural space.  Procedure Details: Using a paramedian approach from the side mentioned above, the region overlying the inferior lamina was localized under fluoroscopic visualization and the soft tissues overlying this structure were infiltrated with 4 ml. of 1% Lidocaine without Epinephrine. The Tuohy needle was inserted into the epidural space using a paramedian approach.   The epidural space was localized using loss of resistance along with counter oblique bi-planar fluoroscopic views.  After negative aspirate for air, blood, and CSF, a 2 ml. volume of Isovue-250 was injected into the epidural space and the flow of contrast was observed. Radiographs were obtained for documentation purposes.    The injectate was administered into the level noted above.   Additional Comments:  The patient tolerated the procedure well Dressing: 2 x 2 sterile gauze and Band-Aid    Post-procedure details: Patient was observed during the procedure. Post-procedure instructions were reviewed.  Patient left the clinic in stable  condition.

## 2021-06-08 NOTE — Progress Notes (Signed)
[name]. - 66 y.o. male MRN [id number]  Date of birth: September 26, 1955  Office Visit Note: Visit Date: 06/05/2021 PCP: Dettinger, Fransisca Kaufmann, MD Referred by: Dettinger, Fransisca Kaufmann, MD  Subjective: Chief Complaint  Patient presents with   Lower Back - Pain   Right Hip - Pain   Right Leg - Pain   HPI:  [name]. is a 66 y.o. male who comes in today at the request of Barnet Pall, FNP for planned Right L5-S1 Lumbar Interlaminar epidural steroid injection with fluoroscopic guidance.  The patient has failed conservative care including home exercise, medications, time and activity modification.  This injection will be diagnostic and hopefully therapeutic.  Please see requesting physician notes for further details and justification.   ROS Otherwise per HPI.  Assessment & Plan: Visit Diagnoses:    ICD-10-CM   1. Lumbar radiculopathy  M54.16 XR C-ARM NO REPORT    Epidural Steroid injection    methylPREDNISolone acetate (DEPO-MEDROL) injection 80 mg      Plan: No additional findings.   Meds & Orders:  Meds ordered this encounter  Medications   methylPREDNISolone acetate (DEPO-MEDROL) injection 80 mg    Orders Placed This Encounter  Procedures   XR C-ARM NO REPORT   Epidural Steroid injection    Follow-up: Return if symptoms worsen or fail to improve.   Procedures: No procedures performed  Lumbar Epidural Steroid Injection - Interlaminar Approach with Fluoroscopic Guidance  Patient: [name].      Date of Birth: 04/05/1956 MRN: [id number] PCP: Dettinger, Fransisca Kaufmann, MD      Visit Date: 06/05/2021   Universal Protocol:     Consent Given By: the patient  Position: PRONE  Additional Comments: Vital signs were monitored before and after the procedure. Patient was prepped and draped in the usual sterile fashion. The correct patient, procedure, and site was verified.   Injection Procedure Details:   Procedure diagnoses: Lumbar radiculopathy  [M54.16]   Meds Administered:  Meds ordered this encounter  Medications   methylPREDNISolone acetate (DEPO-MEDROL) injection 80 mg     Laterality: Right  Location/Site:  L5-S1  Needle: 3.5 in., 20 ga. Tuohy  Needle Placement: Paramedian epidural  Findings:   -Comments: Excellent flow of contrast into the epidural space.  Procedure Details: Using a paramedian approach from the side mentioned above, the region overlying the inferior lamina was localized under fluoroscopic visualization and the soft tissues overlying this structure were infiltrated with 4 ml. of 1% Lidocaine without Epinephrine. The Tuohy needle was inserted into the epidural space using a paramedian approach.   The epidural space was localized using loss of resistance along with counter oblique bi-planar fluoroscopic views.  After negative aspirate for air, blood, and CSF, a 2 ml. volume of Isovue-250 was injected into the epidural space and the flow of contrast was observed. Radiographs were obtained for documentation purposes.    The injectate was administered into the level noted above.   Additional Comments:  The patient tolerated the procedure well Dressing: 2 x 2 sterile gauze and Band-Aid    Post-procedure details: Patient was observed during the procedure. Post-procedure instructions were reviewed.  Patient left the clinic in stable condition.    Clinical History: MRI LUMBAR SPINE WITHOUT CONTRAST   TECHNIQUE: Multiplanar, multisequence MR imaging of the lumbar spine was performed. No intravenous contrast was administered.   COMPARISON:  MRI 09/02/2006.  X-ray 03/05/2021   FINDINGS: Technical Note: Despite efforts by the technologist  and patient, motion artifact is present on today's exam and could not be eliminated. This reduces exam sensitivity and specificity.   Segmentation:  Standard.   Alignment:  No significant static listhesis.   Vertebrae: Postsurgical changes from posterior  spinal fusion at L1 through L4. The L2 and L3 vertebral bodies are fused. Remote well-healed fracture deformity of L3. Unchanged superior endplate deformity at L2, which may be posttraumatic or represent a Schmorl's node. No evidence of discitis.   Pathologic fracture of the L4 vertebral body involving the superior and inferior endplates with up to 78% vertebral body height loss centrally. There is diffuse confluent low T1 marrow signal throughout the L4 vertebral body with T2/STIR hyperintense signal. Subtle expansion of the posterior wall, which may be secondary to underlying lesion and/or compression. No extraosseous soft tissue component is identified. No additional marrow replacing bone lesions are seen within the field of view.   Conus medullaris and cauda equina: Conus extends to the T12-L1 level. Conus and cauda equina appear normal. No abnormalities are evident within the epidural space.   Paraspinal and other soft tissues: Negative.   Disc levels:   T12-L1: Shallow left paracentral disc protrusion. Mild facet hypertrophy. Mild left foraminal stenosis. No canal or right foraminal stenosis. Unchanged.   L1-L2: Prior fusion.  No evidence of foraminal or canal stenosis.   L2-L3: Prior fusion.  No evidence of foraminal or canal stenosis.   L3-L4: Prior fusion. There may be mild residual bilateral foraminal stenosis. No canal stenosis.   L4-L5: Mild annular disc bulge. Mild-moderate bilateral facet arthropathy and ligamentum flavum buckling. Findings result in mild-to-moderate canal stenosis with moderate-severe bilateral foraminal stenosis. Findings have progressed from prior.   L5-S1: Mild annular disc bulge. Moderate bilateral facet arthropathy. No foraminal or canal stenosis.   IMPRESSION: 1. Pathologic fracture of the L4 vertebral body with up to 40% vertebral body height loss centrally. Diffuse marrow replacement throughout the vertebral body with appearance  concerning for metastatic disease or myeloma. Lymphoma could also have this appearance. Further workup including hematologic testing is recommended. Nuclear medicine whole-body bone scan could be considered to assess for additional sites of bony involvement. 2. Progression of degenerative changes at L4-L5 with mild-to-moderate canal stenosis and moderate-severe bilateral foraminal stenosis. 3. Prior posterior spinal fusion at L1 through L4. No canal stenosis at the operative levels. Mild residual bilateral foraminal stenosis at L3-L4.   These results will be called to the ordering clinician or representative by the Radiologist Assistant, and communication documented in the PACS or [employer].     Electronically Signed   By: Davina Poke D.O.   On: 03/07/2021 13:01     Objective:  VS:  HT:     WT:    BMI:      BP:124/82   HR:78bpm   TEMP: ( )   RESP:  Physical Exam Vitals and nursing note reviewed.  Constitutional:      General: He is not in acute distress.    Appearance: Normal appearance. He is not ill-appearing.  HENT:     Head: Normocephalic and atraumatic.     Right Ear: External ear normal.     Left Ear: External ear normal.     Nose: No congestion.  Eyes:     Extraocular Movements: Extraocular movements intact.  Cardiovascular:     Rate and Rhythm: Normal rate.     Pulses: Normal pulses.  Pulmonary:     Effort: Pulmonary effort is normal. No respiratory distress.  Abdominal:     General: There is no distension.     Palpations: Abdomen is soft.  Musculoskeletal:        General: No tenderness or signs of injury.     Cervical back: Neck supple.     Right lower leg: No edema.     Left lower leg: No edema.     Comments: Patient has good distal strength without clonus. Uses wheelchair. Slow to arise.  Skin:    Findings: No erythema or rash.  Neurological:     General: No focal deficit present.     Mental Status: He is alert and oriented to person,  place, and time.     Sensory: No sensory deficit.     Motor: No weakness or abnormal muscle tone.     Coordination: Coordination normal.  Psychiatric:        Mood and Affect: Mood normal.        Behavior: Behavior normal.     Imaging: No results found.

## 2021-06-11 ENCOUNTER — Other Ambulatory Visit: Payer: Self-pay

## 2021-06-11 ENCOUNTER — Other Ambulatory Visit: Payer: Self-pay | Admitting: *Deleted

## 2021-06-11 ENCOUNTER — Ambulatory Visit
Admission: RE | Admit: 2021-06-11 | Discharge: 2021-06-11 | Disposition: A | Discharge status: Home / Self Care | Payer: Medicare PPO | Source: Ambulatory Visit | Attending: Neurology | Admitting: Neurology

## 2021-06-11 ENCOUNTER — Other Ambulatory Visit (INDEPENDENT_AMBULATORY_CARE_PROVIDER_SITE_OTHER): Payer: Self-pay

## 2021-06-11 DIAGNOSIS — Z0289 Encounter for other administrative examinations: Secondary | ICD-10-CM

## 2021-06-11 DIAGNOSIS — R251 Tremor, unspecified: Secondary | ICD-10-CM

## 2021-06-11 DIAGNOSIS — R799 Abnormal finding of blood chemistry, unspecified: Secondary | ICD-10-CM

## 2021-06-11 DIAGNOSIS — M5412 Radiculopathy, cervical region: Secondary | ICD-10-CM

## 2021-06-11 DIAGNOSIS — W19XXXS Unspecified fall, sequela: Secondary | ICD-10-CM

## 2021-06-17 ENCOUNTER — Ambulatory Visit (HOSPITAL_COMMUNITY): Payer: Medicare PPO | Attending: Neurology | Admitting: Physical Therapy

## 2021-06-18 LAB — THYROID PANEL WITH TSH
Free Thyroxine Index: 1.9 (ref 1.2–4.9)
T3 Uptake Ratio: 30 % (ref 24–39)
T4, Total: 6.3 ug/dL (ref 4.5–12.0)
TSH: 2.12 u[IU]/mL (ref 0.450–4.500)

## 2021-06-18 LAB — PARANEOPLASTIC AB
AGNA-1: NEGATIVE
Amphiphysin Antibody: NEGATIVE
Anti-Hu Ab: NEGATIVE
Anti-Ri Ab: NEGATIVE
Anti-Yo Ab: NEGATIVE
Antineruonal nuclear Ab Type 3: NEGATIVE
CASPR2 Antibody,Cell-based IFA: NEGATIVE
CRMP-5 IgG: NEGATIVE
Interpretation: NEGATIVE
LGI1 Antibody, Cell-based IFA: NEGATIVE
Purkinje Cell Cyto Ab Type 2: NEGATIVE
Purkinje Cell Cyto Ab Type Tr: NEGATIVE
VGCC Antibody: <1 pmol/L (ref 0.0–30.0)

## 2021-06-18 LAB — VITAMIN B12: Vitamin B-12: 1025 pg/mL (ref 232–1245)

## 2021-06-18 NOTE — Progress Notes (Signed)
Spoke with patient, discussed cervical spine MRI results, all questions answered. He does not report radicular pain, we will continue to monitor him and if needed will refer to surgeon for possible surgical treatment.

## 2021-06-20 ENCOUNTER — Other Ambulatory Visit: Payer: Self-pay | Admitting: Family Medicine

## 2021-06-26 ENCOUNTER — Other Ambulatory Visit: Payer: Self-pay

## 2021-06-26 ENCOUNTER — Ambulatory Visit (HOSPITAL_COMMUNITY): Payer: Medicare PPO | Attending: Neurology | Admitting: Physical Therapy

## 2021-06-26 DIAGNOSIS — R296 Repeated falls: Secondary | ICD-10-CM | POA: Insufficient documentation

## 2021-06-26 DIAGNOSIS — M6281 Muscle weakness (generalized): Secondary | ICD-10-CM | POA: Diagnosis not present

## 2021-06-26 DIAGNOSIS — G629 Polyneuropathy, unspecified: Secondary | ICD-10-CM | POA: Diagnosis not present

## 2021-06-26 DIAGNOSIS — R262 Difficulty in walking, not elsewhere classified: Secondary | ICD-10-CM | POA: Insufficient documentation

## 2021-06-26 DIAGNOSIS — R1312 Dysphagia, oropharyngeal phase: Secondary | ICD-10-CM | POA: Diagnosis not present
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
## 2021-07-01 ENCOUNTER — Ambulatory Visit (HOSPITAL_COMMUNITY): Payer: Medicare PPO

## 2021-07-01 ENCOUNTER — Encounter (HOSPITAL_COMMUNITY): Payer: Self-pay

## 2021-07-03 ENCOUNTER — Encounter (HOSPITAL_COMMUNITY): Payer: Medicare PPO

## 2021-07-07 ENCOUNTER — Ambulatory Visit (HOSPITAL_COMMUNITY): Payer: Medicare PPO

## 2021-07-07 ENCOUNTER — Encounter: Payer: Self-pay | Admitting: Neurology

## 2021-07-07 ENCOUNTER — Ambulatory Visit (INDEPENDENT_AMBULATORY_CARE_PROVIDER_SITE_OTHER): Payer: Medicare PPO | Admitting: Neurology

## 2021-07-07 VITALS — BP 150/95 | HR 80

## 2021-07-07 DIAGNOSIS — G253 Myoclonus: Secondary | ICD-10-CM

## 2021-07-07 DIAGNOSIS — R2681 Unsteadiness on feet: Secondary | ICD-10-CM

## 2021-07-07 DIAGNOSIS — U099 Post covid-19 condition, unspecified: Secondary | ICD-10-CM

## 2021-07-07 DIAGNOSIS — M5412 Radiculopathy, cervical region: Secondary | ICD-10-CM

## 2021-07-07 DIAGNOSIS — W19XXXS Unspecified fall, sequela: Secondary | ICD-10-CM

## 2021-07-07 MED ORDER — DULOXETINE HCL 60 MG PO CPEP: 60.0000 mg | ORAL_CAPSULE | Freq: Every day | ORAL | 3 refills | Status: DC
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
## 2021-07-07 NOTE — Patient Instructions (Signed)
Continue current medications Continue with aggressive physical therapy Referral to pain management, I prescribed you today tramadol 30 pills with no refills.  This is a one-time courtesy but plan will be for you to follow-up with pain management for further refill of the medication Nerve conduction EMG study in the bilateral upper extremity to look for conduction block After completion of the EMG, we will consider referral to neurosurgery. Follow-up in 3 months

## 2021-07-08 ENCOUNTER — Telehealth: Payer: Self-pay | Admitting: Neurology

## 2021-07-08 NOTE — Telephone Encounter (Signed)
Dr. April Manson has also signed and order for a upright roller walker, stand up w/ seat. The patient's wife will pick it up 07/10/21. Placed up front. ?

## 2021-07-08 NOTE — Telephone Encounter (Signed)
Pt's wife, Eziah Negro pharmacy informed pt have not received prescription for DULoxetine (CYMBALTA) 60 MG capsuleand  traMADol (ULTRAM) 50 MG tablet at [id number] Can you resend the prescriptions? ?

## 2021-07-08 NOTE — Telephone Encounter (Signed)
I called [facility] who confirmed they did not receive either prescription. I spoke to pharmacist, Merry Proud, who took them verbally. I called the patient's wife back and provided this update.  ?

## 2021-07-09 ENCOUNTER — Telehealth: Payer: Self-pay | Admitting: Neurology

## 2021-07-09 NOTE — Telephone Encounter (Signed)
Referral sent to [facility] Dr. Nelva Bush [phone]. ?

## 2021-07-10 ENCOUNTER — Ambulatory Visit (INDEPENDENT_AMBULATORY_CARE_PROVIDER_SITE_OTHER): Payer: Medicare PPO | Admitting: Neurology

## 2021-07-10 ENCOUNTER — Encounter: Payer: Self-pay | Admitting: Neurology

## 2021-07-10 VITALS — BP 146/92 | HR 79 | Ht 75.0 in | Wt 272.5 lb

## 2021-07-10 DIAGNOSIS — G4701 Insomnia due to medical condition: Secondary | ICD-10-CM

## 2021-07-10 DIAGNOSIS — G4719 Other hypersomnia: Secondary | ICD-10-CM

## 2021-07-10 DIAGNOSIS — R252 Cramp and spasm: Secondary | ICD-10-CM | POA: Diagnosis not present

## 2021-07-10 DIAGNOSIS — R0601 Orthopnea: Secondary | ICD-10-CM | POA: Diagnosis not present

## 2021-07-10 DIAGNOSIS — U099 Post covid-19 condition, unspecified: Secondary | ICD-10-CM | POA: Diagnosis not present

## 2021-07-10 DIAGNOSIS — G253 Myoclonus: Secondary | ICD-10-CM | POA: Diagnosis not present

## 2021-07-10 DIAGNOSIS — M5412 Radiculopathy, cervical region: Secondary | ICD-10-CM | POA: Diagnosis not present

## 2021-07-10 DIAGNOSIS — G4730 Sleep apnea, unspecified: Secondary | ICD-10-CM | POA: Diagnosis not present

## 2021-07-10 DIAGNOSIS — G8929 Other chronic pain: Secondary | ICD-10-CM
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
## 2021-07-10 NOTE — Patient Instructions (Signed)
Screening for Sleep Apnea Sleep apnea is a condition in which breathing pauses or becomes shallow during sleep. Sleep apnea screening is a test to determine if you are at risk for sleep apnea. The test includes a series of questions. It will only takes a few minutes. Your health care provider may ask you to have this test in preparation for surgery or as part of a physical exam. What are the symptoms of sleep apnea? Common symptoms of sleep apnea include: Snoring. Waking up often at night. Daytime sleepiness. Pauses in breathing. Choking or gasping during sleep. Irritability. Forgetfulness. Trouble thinking clearly. Depression. Personality changes. Most people with sleep apnea do not know that they have it. What are the advantages of sleep apnea screening? Getting screened for sleep apnea can help: Ensure your safety. It is important for your health care providers to know whether or not you have sleep apnea, especially if you are having surgery or have other long-term (chronic) health conditions. Improve your health and allow you to get a better night's rest. Restful sleep can help you: Have more energy. Lose weight. Improve high blood pressure. Improve diabetes management. Prevent stroke. Prevent car accidents. What happens during the screening? Screening usually includes being asked a list of questions about your sleep quality. Some questions you may be asked include: Do you snore? Is your sleep restless? Do you have daytime sleepiness? Has a partner or spouse told you that you stop breathing during sleep? Have you had trouble concentrating or memory loss? What is your age? What is your neck circumference? To measure your neck, keep your back straight and gently wrap the tape measure around your neck. Put the tape measure at the middle of your neck, between your chin and collarbone. What is your sex assigned at birth? Do you have or are you being treated for high blood  pressure? If your screening test is positive, you are at risk for the condition. Further testing may be needed to confirm a diagnosis of sleep apnea. Where to find more information You can find screening tools online or at your health care clinic. For more information about sleep apnea screening and healthy sleep, visit these websites: Centers for Disease Control and Prevention: www.cdc.gov American Sleep Apnea Association: www.sleepapnea.org Contact a health care provider if: You think that you may have sleep apnea. Summary Sleep apnea screening can help determine if you are at risk for sleep apnea. It is important for your health care providers to know whether or not you have sleep apnea, especially if you are having surgery or have other chronic health conditions. You may be asked to take a screening test for sleep apnea in preparation for surgery or as part of a physical exam. This information is not intended to replace advice given to you by your health care provider. Make sure you discuss any questions you have with your health care provider. Document Revised: 03/22/2020 Document Reviewed: 03/22/2020 Elsevier Patient Education  2022 Elsevier Inc.  

## 2021-07-11 ENCOUNTER — Ambulatory Visit (HOSPITAL_COMMUNITY): Payer: Medicare PPO

## 2021-07-11 ENCOUNTER — Other Ambulatory Visit: Payer: Self-pay | Admitting: Family Medicine

## 2021-07-11 ENCOUNTER — Telehealth (HOSPITAL_COMMUNITY): Payer: Self-pay

## 2021-07-11 DIAGNOSIS — Z9189 Other specified personal risk factors, not elsewhere classified: Secondary | ICD-10-CM

## 2021-07-11 DIAGNOSIS — E1149 Type 2 diabetes mellitus with other diabetic neurological complication: Secondary | ICD-10-CM

## 2021-07-11 DIAGNOSIS — E1159 Type 2 diabetes mellitus with other circulatory complications: Secondary | ICD-10-CM

## 2021-07-11 NOTE — Telephone Encounter (Signed)
Called patient regarding missed appointment.  Spoke with patient and he states he has been sick.  Therapist reminded him of his next therapy appointment on Monday and he states he plans to attend.  ? ?11:38 AM, 07/11/21 ?[name] Chancellor Vanderloop MPT ?[facility] ?[place] [phone] ?Ph:[phone] ? ?

## 2021-07-14 ENCOUNTER — Encounter (HOSPITAL_COMMUNITY): Payer: Medicare PPO

## 2021-07-14 ENCOUNTER — Telehealth (HOSPITAL_COMMUNITY): Payer: Self-pay

## 2021-07-14 ENCOUNTER — Telehealth: Payer: Self-pay | Admitting: Family Medicine

## 2021-07-14 ENCOUNTER — Ambulatory Visit (INDEPENDENT_AMBULATORY_CARE_PROVIDER_SITE_OTHER): Payer: Medicare PPO | Admitting: Family Medicine

## 2021-07-14 ENCOUNTER — Encounter: Payer: Self-pay | Admitting: Family Medicine

## 2021-07-14 DIAGNOSIS — S32040D Wedge compression fracture of fourth lumbar vertebra, subsequent encounter for fracture with routine healing: Secondary | ICD-10-CM

## 2021-07-14 DIAGNOSIS — N410 Acute prostatitis: Secondary | ICD-10-CM

## 2021-07-14 MED ORDER — DOXYCYCLINE HYCLATE 100 MG PO CAPS: 100.0000 mg | ORAL_CAPSULE | Freq: Two times a day (BID) | ORAL | 0 refills | Status: DC

## 2021-07-14 NOTE — Progress Notes (Signed)
? ? ?  Subjective:  ? ? Patient ID: [name]., male    DOB: 1955/05/28, 66 y.o.   MRN: [id number] ? ? ?HPI: ?[name]. is a 66 y.o. male presenting for trouble with his back. Has L4 compression fracture. Seeing neurologist. Three days ago fell in the bathroom. Wife helped ease him to the floor. Can't stand or walk. Concerned for UTI. because of frequency, sleeping a lot and malaise. Having low abd. pain. A little dysuria.  ? ?Depression screen [facility] 2/9 05/15/2021 05/09/2021 02/06/2021 01/13/2021 11/06/2020  ?Decreased Interest 0 0 0 0 0  ?Down, Depressed, Hopeless 0 0 0 0 0  ?PHQ - 2 Score 0 0 0 0 0  ? ? ? ?Relevant past medical, surgical, family and social history reviewed and updated as indicated.  ?Interim medical history since our last visit reviewed. ?Allergies and medications reviewed and updated. ? ?ROS:  ?Review of Systems  ?Constitutional:  Negative for fever.  ?Respiratory:  Negative for shortness of breath.   ?Cardiovascular:  Negative for chest pain.  ?Gastrointestinal:  Positive for abdominal pain (lower abd.).  ?Genitourinary:  Positive for dysuria, frequency and urgency.  ?Musculoskeletal:  Negative for arthralgias.  ?Skin:  Negative for rash.   ? ?Social History  ? ?Tobacco Use  ?Smoking Status Never  ?Smokeless Tobacco Never  ? ? ?   ?Objective:  ?  ? ?Wt Readings from Last 3 Encounters:  ?07/10/21 272 lb 8 oz (123.6 kg)  ?05/20/21 249 lb (112.9 kg)  ?05/15/21 249 lb (112.9 kg)  ?  ? ?Exam deferred. Pt. Harboring due to COVID 19. Phone visit performed.  ? ?Assessment & Plan:  ? ?1. Acute prostatitis   ?2. Closed compression fracture of L4 lumbar vertebra with routine healing, subsequent encounter   ? ? ?Meds ordered this encounter  ?Medications  ? doxycycline (VIBRAMYCIN) 100 MG capsule  ?  Sig: Take 1 capsule (100 mg total) by mouth 2 (two) times daily.  ?  Dispense:  20 capsule  ?  Refill:  0  ? ? ?No orders of the defined types were placed in this encounter. ? ?  ? ?Diagnoses and all  orders for this visit: ? ?Acute prostatitis ? ?Closed compression fracture of L4 lumbar vertebra with routine healing, subsequent encounter ? ?Other orders ?-     doxycycline (VIBRAMYCIN) 100 MG capsule; Take 1 capsule (100 mg total) by mouth 2 (two) times daily. ? ? ? ?Virtual Visit via telephone Note ? ?I discussed the limitations, risks, security and privacy concerns of performing an evaluation and management service by telephone and the availability of in person appointments. The patient was identified with two identifiers. Pt.expressed understanding and agreed to proceed. Pt. Is at home. Dr. Livia Snellen is in his office. ? ?Follow Up Instructions: ?  ?I discussed the assessment and treatment plan with the patient. The patient was provided an opportunity to ask questions and all were answered. The patient agreed with the plan and demonstrated an understanding of the instructions. ?  ?The patient was advised to call back or seek an in-person evaluation if the symptoms worsen or if the condition fails to improve as anticipated. ? ? ?Total minutes including chart review and phone contact time: 11 ? ? ?Follow up plan: ?Return if symptoms worsen or fail to improve. ? ?Claretta Fraise, MD ?[place] ? ?

## 2021-07-14 NOTE — Telephone Encounter (Signed)
Called and spoke directly to [name]. He stated "I'm down on my back and I can't make it". I reviewed our 24 hour cancel policy which he agreed to do in future. Then I reminded him of next visit on Thursday and asked him to call by Wednesday if he can not make it. ? ? ?11:35 AM, 07/14/21 ? ?Margarette Asal Carlis Abbott, PT, DPT  ?Contract Physical Therapist at  ?[facility] ?[phone] ? ?
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
## 2021-07-16 ENCOUNTER — Telehealth: Payer: Self-pay | Admitting: Neurology

## 2021-07-16 ENCOUNTER — Telehealth (HOSPITAL_COMMUNITY): Payer: Self-pay

## 2021-07-16 DIAGNOSIS — M25462 Effusion, left knee: Secondary | ICD-10-CM | POA: Diagnosis not present

## 2021-07-16 DIAGNOSIS — R42 Dizziness and giddiness: Secondary | ICD-10-CM | POA: Diagnosis not present

## 2021-07-16 DIAGNOSIS — R519 Headache, unspecified: Secondary | ICD-10-CM | POA: Diagnosis not present

## 2021-07-16 DIAGNOSIS — E785 Hyperlipidemia, unspecified: Secondary | ICD-10-CM | POA: Diagnosis not present

## 2021-07-16 DIAGNOSIS — R531 Weakness: Secondary | ICD-10-CM | POA: Diagnosis not present

## 2021-07-16 DIAGNOSIS — N179 Acute kidney failure, unspecified: Secondary | ICD-10-CM | POA: Diagnosis not present

## 2021-07-16 DIAGNOSIS — G9341 Metabolic encephalopathy: Secondary | ICD-10-CM | POA: Diagnosis not present

## 2021-07-16 DIAGNOSIS — M1712 Unilateral primary osteoarthritis, left knee: Secondary | ICD-10-CM | POA: Diagnosis not present

## 2021-07-16 DIAGNOSIS — E1142 Type 2 diabetes mellitus with diabetic polyneuropathy: Secondary | ICD-10-CM | POA: Diagnosis not present

## 2021-07-16 DIAGNOSIS — I10 Essential (primary) hypertension: Secondary | ICD-10-CM | POA: Diagnosis not present

## 2021-07-16 DIAGNOSIS — E1165 Type 2 diabetes mellitus with hyperglycemia: Secondary | ICD-10-CM | POA: Diagnosis not present

## 2021-07-16 DIAGNOSIS — S0990XA Unspecified injury of head, initial encounter: Secondary | ICD-10-CM | POA: Diagnosis not present

## 2021-07-16 DIAGNOSIS — N390 Urinary tract infection, site not specified: Secondary | ICD-10-CM | POA: Diagnosis not present

## 2021-07-16 DIAGNOSIS — M25562 Pain in left knee: Secondary | ICD-10-CM | POA: Diagnosis not present

## 2021-07-16 NOTE — Telephone Encounter (Signed)
S/w wife patient fell and can not come in the office - will take a break and return on 07/30/21 if they can. ?

## 2021-07-16 NOTE — Telephone Encounter (Signed)
Thank you :)

## 2021-07-16 NOTE — Telephone Encounter (Signed)
Patient is scheduled at [facility] for 08/19/21 at 1:45 pm to see Dr. Rolena Infante.  ?

## 2021-07-16 NOTE — Telephone Encounter (Signed)
Pt's wife called stating that the pt had another fall on Friday and now the pt is having mobility issues. Wife Anderson Malta would like to know if a referral can sent to [facility]. Please advise. ?

## 2021-07-16 NOTE — Telephone Encounter (Signed)
I spoke to the patient's wife on DPR. She is concerned about her husband's condition. His insurance will allow up to a 20-day stay in a skilled nursing facility where he would be assessed for all needed disciplines. She has not called the facility yet. I instructed her to contact them first to check bed availability and speak to the staff social worker. They will be able to guide her on how to start the process of getting him admitted. She also plans to contact his PCP since this is related to his overall health, including condition outside of his neurological needs. She will call us back if she needs documentation from our office. ?

## 2021-07-17 ENCOUNTER — Encounter (HOSPITAL_COMMUNITY): Payer: Medicare PPO | Admitting: Physical Therapy

## 2021-07-17 DIAGNOSIS — N179 Acute kidney failure, unspecified: Secondary | ICD-10-CM | POA: Diagnosis not present

## 2021-07-18 DIAGNOSIS — M542 Cervicalgia: Secondary | ICD-10-CM | POA: Diagnosis not present

## 2021-07-18 DIAGNOSIS — E440 Moderate protein-calorie malnutrition: Secondary | ICD-10-CM | POA: Diagnosis not present

## 2021-07-18 DIAGNOSIS — E785 Hyperlipidemia, unspecified: Secondary | ICD-10-CM | POA: Diagnosis not present

## 2021-07-18 DIAGNOSIS — R531 Weakness: Secondary | ICD-10-CM | POA: Diagnosis not present

## 2021-07-18 DIAGNOSIS — R4182 Altered mental status, unspecified: Secondary | ICD-10-CM | POA: Diagnosis not present

## 2021-07-18 DIAGNOSIS — M1712 Unilateral primary osteoarthritis, left knee: Secondary | ICD-10-CM | POA: Diagnosis not present

## 2021-07-18 DIAGNOSIS — R41841 Cognitive communication deficit: Secondary | ICD-10-CM | POA: Diagnosis not present

## 2021-07-18 DIAGNOSIS — E119 Type 2 diabetes mellitus without complications: Secondary | ICD-10-CM | POA: Diagnosis not present

## 2021-07-18 DIAGNOSIS — J9610 Chronic respiratory failure, unspecified whether with hypoxia or hypercapnia: Secondary | ICD-10-CM | POA: Diagnosis not present

## 2021-07-18 DIAGNOSIS — G629 Polyneuropathy, unspecified: Secondary | ICD-10-CM | POA: Diagnosis not present

## 2021-07-18 DIAGNOSIS — M25462 Effusion, left knee: Secondary | ICD-10-CM | POA: Diagnosis not present

## 2021-07-18 DIAGNOSIS — M310 Hypersensitivity angiitis: Secondary | ICD-10-CM | POA: Diagnosis not present

## 2021-07-18 DIAGNOSIS — G9341 Metabolic encephalopathy: Secondary | ICD-10-CM | POA: Diagnosis not present

## 2021-07-18 DIAGNOSIS — E1165 Type 2 diabetes mellitus with hyperglycemia: Secondary | ICD-10-CM | POA: Diagnosis not present

## 2021-07-18 DIAGNOSIS — D72829 Elevated white blood cell count, unspecified: Secondary | ICD-10-CM | POA: Diagnosis not present

## 2021-07-18 DIAGNOSIS — N390 Urinary tract infection, site not specified: Secondary | ICD-10-CM | POA: Diagnosis not present

## 2021-07-18 DIAGNOSIS — G252 Other specified forms of tremor: Secondary | ICD-10-CM | POA: Diagnosis not present

## 2021-07-18 DIAGNOSIS — E1142 Type 2 diabetes mellitus with diabetic polyneuropathy: Secondary | ICD-10-CM | POA: Diagnosis not present

## 2021-07-18 DIAGNOSIS — M6281 Muscle weakness (generalized): Secondary | ICD-10-CM | POA: Diagnosis not present

## 2021-07-18 DIAGNOSIS — Z7401 Bed confinement status: Secondary | ICD-10-CM | POA: Diagnosis not present

## 2021-07-18 DIAGNOSIS — U099 Post covid-19 condition, unspecified: Secondary | ICD-10-CM | POA: Diagnosis not present

## 2021-07-18 DIAGNOSIS — R2689 Other abnormalities of gait and mobility: Secondary | ICD-10-CM | POA: Diagnosis not present

## 2021-07-18 DIAGNOSIS — R42 Dizziness and giddiness: Secondary | ICD-10-CM | POA: Diagnosis not present

## 2021-07-18 DIAGNOSIS — I10 Essential (primary) hypertension: Secondary | ICD-10-CM | POA: Diagnosis not present

## 2021-07-18 DIAGNOSIS — R262 Difficulty in walking, not elsewhere classified: Secondary | ICD-10-CM | POA: Diagnosis not present

## 2021-07-18 DIAGNOSIS — G319 Degenerative disease of nervous system, unspecified: Secondary | ICD-10-CM | POA: Diagnosis not present

## 2021-07-18 DIAGNOSIS — N179 Acute kidney failure, unspecified: Secondary | ICD-10-CM | POA: Diagnosis not present

## 2021-07-18 DIAGNOSIS — R5381 Other malaise: Secondary | ICD-10-CM | POA: Diagnosis not present

## 2021-07-18 DIAGNOSIS — E039 Hypothyroidism, unspecified: Secondary | ICD-10-CM | POA: Diagnosis not present

## 2021-07-18 DIAGNOSIS — R1312 Dysphagia, oropharyngeal phase: Secondary | ICD-10-CM | POA: Diagnosis not present

## 2021-07-21 DIAGNOSIS — D72829 Elevated white blood cell count, unspecified: Secondary | ICD-10-CM | POA: Diagnosis not present

## 2021-07-21 DIAGNOSIS — N390 Urinary tract infection, site not specified: Secondary | ICD-10-CM | POA: Diagnosis not present

## 2021-07-21 DIAGNOSIS — E1165 Type 2 diabetes mellitus with hyperglycemia: Secondary | ICD-10-CM | POA: Diagnosis not present

## 2021-07-21 DIAGNOSIS — I10 Essential (primary) hypertension: Secondary | ICD-10-CM | POA: Diagnosis not present

## 2021-07-22 ENCOUNTER — Encounter (HOSPITAL_COMMUNITY): Payer: Medicare PPO

## 2021-07-23 DIAGNOSIS — I10 Essential (primary) hypertension: Secondary | ICD-10-CM | POA: Diagnosis not present

## 2021-07-23 DIAGNOSIS — R5381 Other malaise: Secondary | ICD-10-CM | POA: Diagnosis not present

## 2021-07-23 DIAGNOSIS — M6281 Muscle weakness (generalized): Secondary | ICD-10-CM | POA: Diagnosis not present

## 2021-07-23 DIAGNOSIS — U099 Post covid-19 condition, unspecified: Secondary | ICD-10-CM | POA: Diagnosis not present

## 2021-07-24 ENCOUNTER — Encounter (HOSPITAL_COMMUNITY): Payer: Medicare PPO | Admitting: Physical Therapy

## 2021-07-24 ENCOUNTER — Telehealth: Payer: Self-pay

## 2021-07-24 ENCOUNTER — Encounter (HOSPITAL_COMMUNITY): Payer: Self-pay | Admitting: Physical Therapy

## 2021-07-24 ENCOUNTER — Telehealth (HOSPITAL_COMMUNITY): Payer: Self-pay | Admitting: Physical Therapy

## 2021-07-24 DIAGNOSIS — R4182 Altered mental status, unspecified: Secondary | ICD-10-CM | POA: Diagnosis not present

## 2021-07-24 DIAGNOSIS — G319 Degenerative disease of nervous system, unspecified: Secondary | ICD-10-CM | POA: Diagnosis not present

## 2021-07-24 NOTE — Therapy (Signed)
[facility] ?[place] ?[address] ?[place], [place], [address] ?Phone: [phone]   Fax:  [phone] ? ?Patient Details  ?Name: [name]. ?MRN: [id number] ?Date of Birth: 07-13-1955 ?Referring Provider:  No ref. provider found ? ?Encounter Date: 07/24/2021 ?PHYSICAL THERAPY DISCHARGE SUMMARY ? ?Visits from Start of Care: eval only ? ?Current functional level related to goals / functional outcomes: ?unknown ?  ?Remaining deficits: ?unknown ?  ?Education / Equipment: ?HEP  ? ?Patient agrees to discharge. Patient goals were not met. Patient is being discharged due to not returning since the last visit.  ?Rayetta Humphrey, PT CLT ?[id number] 07/24/2021, 9:10 AM ? ?[facility] ?[place] ?[address] ?[place], [place], [address] ?Phone: [phone]   Fax:  [phone] ?

## 2021-07-24 NOTE — Telephone Encounter (Signed)
Spoke with pt's wife this morning to schedule pt's sleep study but pt is currently in a rehab facility so they will call back at a better time. ?

## 2021-07-24 NOTE — Telephone Encounter (Signed)
Patient's wife called to d/c he has been accepted in the [facility] in [place], [place] ?

## 2021-07-26 DIAGNOSIS — E440 Moderate protein-calorie malnutrition: Secondary | ICD-10-CM | POA: Diagnosis not present

## 2021-07-26 DIAGNOSIS — G9341 Metabolic encephalopathy: Secondary | ICD-10-CM | POA: Diagnosis not present

## 2021-07-26 DIAGNOSIS — R262 Difficulty in walking, not elsewhere classified: Secondary | ICD-10-CM | POA: Diagnosis not present

## 2021-07-26 DIAGNOSIS — U099 Post covid-19 condition, unspecified: Secondary | ICD-10-CM | POA: Diagnosis not present

## 2021-07-28 ENCOUNTER — Ambulatory Visit: Payer: Medicare Other | Admitting: Neurology

## 2021-07-28 DIAGNOSIS — G319 Degenerative disease of nervous system, unspecified: Secondary | ICD-10-CM | POA: Diagnosis not present

## 2021-07-28 DIAGNOSIS — G252 Other specified forms of tremor: Secondary | ICD-10-CM | POA: Diagnosis not present

## 2021-07-28 DIAGNOSIS — R5381 Other malaise: Secondary | ICD-10-CM | POA: Diagnosis not present

## 2021-07-28 DIAGNOSIS — M310 Hypersensitivity angiitis: Secondary | ICD-10-CM | POA: Diagnosis not present

## 2021-07-29 ENCOUNTER — Encounter (HOSPITAL_COMMUNITY): Payer: Medicare PPO

## 2021-07-31 ENCOUNTER — Encounter (HOSPITAL_COMMUNITY): Payer: Medicare PPO

## 2021-07-31 DIAGNOSIS — G9341 Metabolic encephalopathy: Secondary | ICD-10-CM | POA: Diagnosis not present

## 2021-07-31 DIAGNOSIS — M6281 Muscle weakness (generalized): Secondary | ICD-10-CM | POA: Diagnosis not present

## 2021-07-31 DIAGNOSIS — E1165 Type 2 diabetes mellitus with hyperglycemia: Secondary | ICD-10-CM | POA: Diagnosis not present

## 2021-07-31 DIAGNOSIS — I10 Essential (primary) hypertension: Secondary | ICD-10-CM | POA: Diagnosis not present

## 2021-08-04 ENCOUNTER — Telehealth: Payer: Self-pay | Admitting: Family Medicine

## 2021-08-04 ENCOUNTER — Inpatient Hospital Stay: Payer: Medicare PPO | Admitting: Nurse Practitioner

## 2021-08-04 DIAGNOSIS — E119 Type 2 diabetes mellitus without complications: Secondary | ICD-10-CM | POA: Diagnosis not present

## 2021-08-04 DIAGNOSIS — M1909 Primary osteoarthritis, other specified site: Secondary | ICD-10-CM | POA: Diagnosis not present

## 2021-08-04 DIAGNOSIS — R5381 Other malaise: Secondary | ICD-10-CM | POA: Diagnosis not present

## 2021-08-04 DIAGNOSIS — M6281 Muscle weakness (generalized): Secondary | ICD-10-CM | POA: Diagnosis not present

## 2021-08-04 NOTE — Telephone Encounter (Signed)
Appt made

## 2021-08-05 ENCOUNTER — Encounter (HOSPITAL_COMMUNITY): Payer: Medicare PPO

## 2021-08-05 ENCOUNTER — Telehealth: Payer: Self-pay | Admitting: Family Medicine

## 2021-08-05 ENCOUNTER — Telehealth: Payer: Self-pay | Admitting: Physical Medicine and Rehabilitation

## 2021-08-05 ENCOUNTER — Other Ambulatory Visit: Payer: Self-pay | Admitting: Physical Medicine and Rehabilitation

## 2021-08-05 DIAGNOSIS — M5416 Radiculopathy, lumbar region: Secondary | ICD-10-CM

## 2021-08-05 NOTE — Telephone Encounter (Signed)
Will verify with Dettinger first thing Monday morning. Pts appt is at 3:25pm Monday. ? ?Wife states that pt is in a lot of pain. Back, Hip pain. Radiates down leg. Has numbness as well. ? ?Had an appt yesterday.They were on the way to the office and had to turn around because pt was in so much pain. ? ?Appt with Dettinger on Monday is not for a hospital follow up but for a regular 25mcheck. Pt does nee labs and it would be best if he could come in. Advised wife of this but will verify with Dettinger if ok just for phone visit. ? ?Will address with Dettinger on Monday. ? ?If telephone visit ok call [phone] ?

## 2021-08-05 NOTE — Telephone Encounter (Signed)
Patient is unable to ride in the car at this time and would like to know if the appt with Dettinger on 4/17 could be a televisit. Please call back to let them know.  ?

## 2021-08-05 NOTE — Progress Notes (Signed)
Patient called and states continued right lower back pain radiating to leg. States he would like to repeat injection he had in our office in January as this gave him the most relief. I did place an order for right L4 and L5 transforaminal epidural steroid injection.  ?

## 2021-08-05 NOTE — Telephone Encounter (Signed)
Anderson Malta called in to schedule an appointment with Dr. Ernestina Patches for her husband to receive a back injection. ?

## 2021-08-06 ENCOUNTER — Telehealth: Payer: Self-pay | Admitting: Physical Medicine and Rehabilitation

## 2021-08-06 ENCOUNTER — Encounter: Payer: Self-pay | Admitting: Family Medicine

## 2021-08-06 ENCOUNTER — Telehealth: Payer: Self-pay | Admitting: Family Medicine

## 2021-08-06 NOTE — Telephone Encounter (Signed)
Pt's wife called stating that pt has court Monday and need a letter drawn up to be faxed to court for pt Letter need to say he is unable to walk and being treated by Dr. Ernestina Patches. Please fax today if possible to [facility] attention to [employer]. Fax number is [phone]. Please call pt's wife Anderson Malta when sent at [phone]. ?

## 2021-08-06 NOTE — Telephone Encounter (Signed)
Pts wife called requesting that we send a letter to the [facility] in [place] stating that due to pts medical conditions, he is unable to attend due to not being able to walk.  ? ?Can send letter to Bethel Born at fax# [phone] ? ?Needs to be sent before Monday 08/11/21. ?

## 2021-08-06 NOTE — Telephone Encounter (Signed)
Printed. On my desk. Please fax. ?

## 2021-08-06 NOTE — Telephone Encounter (Signed)
This letter is needed for a court case per [facility]. Dettinger does not return until the 17th. Please review for note. ?

## 2021-08-07 ENCOUNTER — Encounter (HOSPITAL_COMMUNITY): Payer: Medicare PPO | Admitting: Physical Therapy

## 2021-08-07 NOTE — Telephone Encounter (Signed)
Letter faxed and wife informed. ?

## 2021-08-11 ENCOUNTER — Ambulatory Visit: Payer: Medicaid - Out of State | Admitting: Family Medicine

## 2021-08-11 ENCOUNTER — Other Ambulatory Visit: Payer: Self-pay | Admitting: Family Medicine

## 2021-08-11 ENCOUNTER — Telehealth: Payer: Self-pay | Admitting: Family Medicine

## 2021-08-11 DIAGNOSIS — E1149 Type 2 diabetes mellitus with other diabetic neurological complication: Secondary | ICD-10-CM

## 2021-08-11 MED ORDER — DEXCOM G6 SENSOR MISC: 1.0000 | 11 refills | Status: DC

## 2021-08-11 MED ORDER — DEXCOM G6 RECEIVER DEVI: 1.0000 | Freq: Four times a day (QID) | 3 refills | Status: DC

## 2021-08-11 MED ORDER — DEXCOM G6 TRANSMITTER MISC: 1.0000 | Freq: Four times a day (QID) | 3 refills | Status: DC

## 2021-08-11 NOTE — Telephone Encounter (Signed)
Pt had to cancel today's appt because of pain and being unable to ride to the visit. Has hospital follow up tomorrow with Shelah Lewandowsky. It has been scheduled as a virtual visit. Informed that labs need to be performed ASAP.  ? ?Wife would also like to see if pt can be prescribed Dexcom. States that fingers are very sore. ?

## 2021-08-11 NOTE — Progress Notes (Unsigned)
This phone visit was a visit to discuss patient's diabetic management and because she is out of control and using insulin daily and having to check her blood sugars 4 times daily I believe she would be a good candidate for a continuous subcutaneous glucose monitor such as freestyle libre.  Patient has a history of issues with hyperglycemia and uncontrolled diabetes and that is why he has to check more frequently. ? ?Sent Dexcom for the patient ?

## 2021-08-11 NOTE — Telephone Encounter (Signed)
Spoke with wife. [facility]er aware. See previous note. ?

## 2021-08-11 NOTE — Telephone Encounter (Signed)
Patient needs an A1c and labs, whether he can come in and do the labs before hand or come in for the visit to do the labs, I am fine with either but he does need some lab work.  If he does labs before or outside of the visit then I am fine with the virtual visit. ?

## 2021-08-12 ENCOUNTER — Other Ambulatory Visit: Payer: Self-pay | Admitting: Family Medicine

## 2021-08-12 ENCOUNTER — Telehealth: Payer: Self-pay | Admitting: Family Medicine

## 2021-08-12 ENCOUNTER — Telehealth (INDEPENDENT_AMBULATORY_CARE_PROVIDER_SITE_OTHER): Payer: Medicare PPO | Admitting: Nurse Practitioner

## 2021-08-12 ENCOUNTER — Encounter (HOSPITAL_COMMUNITY): Payer: Medicare PPO | Admitting: Physical Therapy

## 2021-08-12 ENCOUNTER — Encounter: Payer: Self-pay | Admitting: Nurse Practitioner

## 2021-08-12 DIAGNOSIS — E1149 Type 2 diabetes mellitus with other diabetic neurological complication: Secondary | ICD-10-CM

## 2021-08-12 DIAGNOSIS — Z8781 Personal history of (healed) traumatic fracture: Secondary | ICD-10-CM

## 2021-08-12 DIAGNOSIS — G8929 Other chronic pain: Secondary | ICD-10-CM | POA: Diagnosis not present

## 2021-08-12 DIAGNOSIS — M545 Low back pain, unspecified: Secondary | ICD-10-CM

## 2021-08-12 DIAGNOSIS — Z09 Encounter for follow-up examination after completed treatment for conditions other than malignant neoplasm: Secondary | ICD-10-CM | POA: Diagnosis not present

## 2021-08-12 MED ORDER — DEXCOM G6 SENSOR MISC: 1.0000 | 11 refills | Status: DC

## 2021-08-12 MED ORDER — DEXCOM G6 TRANSMITTER MISC: 1.0000 | Freq: Four times a day (QID) | 3 refills | Status: DC

## 2021-08-12 MED ORDER — DEXCOM G6 RECEIVER DEVI: 1.0000 | Freq: Four times a day (QID) | 3 refills | Status: DC

## 2021-08-12 NOTE — Telephone Encounter (Signed)
LM

## 2021-08-12 NOTE — Telephone Encounter (Signed)
Please send Continuous Blood Gluc Receiver ([place]) [facility]. ?

## 2021-08-12 NOTE — Patient Instructions (Signed)
Acute Back Pain, Adult Acute back pain is sudden and usually short-lived. It is often caused by an injury to the muscles and tissues in the back. The injury may result from: A muscle, tendon, or ligament getting overstretched or torn. Ligaments are tissues that connect bones to each other. Lifting something improperly can cause a back strain. Wear and tear (degeneration) of the spinal disks. Spinal disks are circular tissue that provide cushioning between the bones of the spine (vertebrae). Twisting motions, such as while playing sports or doing yard work. A hit to the back. Arthritis. You may have a physical exam, lab tests, and imaging tests to find the cause of your pain. Acute back pain usually goes away with rest and home care. Follow these instructions at home: Managing pain, stiffness, and swelling Take over-the-counter and prescription medicines only as told by your health care provider. Treatment may include medicines for pain and inflammation that are taken by mouth or applied to the skin, or muscle relaxants. Your health care provider may recommend applying ice during the first 24-48 hours after your pain starts. To do this: Put ice in a plastic bag. Place a towel between your skin and the bag. Leave the ice on for 20 minutes, 2-3 times a day. Remove the ice if your skin turns bright red. This is very important. If you cannot feel pain, heat, or cold, you have a greater risk of damage to the area. If directed, apply heat to the affected area as often as told by your health care provider. Use the heat source that your health care provider recommends, such as a moist heat pack or a heating pad. Place a towel between your skin and the heat source. Leave the heat on for 20-30 minutes. Remove the heat if your skin turns bright red. This is especially important if you are unable to feel pain, heat, or cold. You have a greater risk of getting burned. Activity  Do not stay in bed. Staying in  bed for more than 1-2 days can delay your recovery. Sit up and stand up straight. Avoid leaning forward when you sit or hunching over when you stand. If you work at a desk, sit close to it so you do not need to lean over. Keep your chin tucked in. Keep your neck drawn back, and keep your elbows bent at a 90-degree angle (right angle). Sit high and close to the steering wheel when you drive. Add lower back (lumbar) support to your car seat, if needed. Take short walks on even surfaces as soon as you are able. Try to increase the length of time you walk each day. Do not sit, drive, or stand in one place for more than 30 minutes at a time. Sitting or standing for long periods of time can put stress on your back. Do not drive or use heavy machinery while taking prescription pain medicine. Use proper lifting techniques. When you bend and lift, use positions that put less stress on your back: Bend your knees. Keep the load close to your body. Avoid twisting. Exercise regularly as told by your health care provider. Exercising helps your back heal faster and helps prevent back injuries by keeping muscles strong and flexible. Work with a physical therapist to make a safe exercise program, as recommended by your health care provider. Do any exercises as told by your physical therapist. Lifestyle Maintain a healthy weight. Extra weight puts stress on your back and makes it difficult to have good   posture. Avoid activities or situations that make you feel anxious or stressed. Stress and anxiety increase muscle tension and can make back pain worse. Learn ways to manage anxiety and stress, such as through exercise. General instructions Sleep on a firm mattress in a comfortable position. Try lying on your side with your knees slightly bent. If you lie on your back, put a pillow under your knees. Keep your head and neck in a straight line with your spine (neutral position) when using electronic equipment like  smartphones or pads. To do this: Raise your smartphone or pad to look at it instead of bending your head or neck to look down. Put the smartphone or pad at the level of your face while looking at the screen. Follow your treatment plan as told by your health care provider. This may include: Cognitive or behavioral therapy. Acupuncture or massage therapy. Meditation or yoga. Contact a health care provider if: You have pain that is not relieved with rest or medicine. You have increasing pain going down into your legs or buttocks. Your pain does not improve after 2 weeks. You have pain at night. You lose weight without trying. You have a fever or chills. You develop nausea or vomiting. You develop abdominal pain. Get help right away if: You develop new bowel or bladder control problems. You have unusual weakness or numbness in your arms or legs. You feel faint. These symptoms may represent a serious problem that is an emergency. Do not wait to see if the symptoms will go away. Get medical help right away. Call your local emergency services (911 in the [place] Do not drive yourself to the hospital. Summary Acute back pain is sudden and usually short-lived. Use proper lifting techniques. When you bend and lift, use positions that put less stress on your back. Take over-the-counter and prescription medicines only as told by your health care provider, and apply heat or ice as told. This information is not intended to replace advice given to you by your health care provider. Make sure you discuss any questions you have with your health care provider. Document Revised: 07/05/2020 Document Reviewed: 07/05/2020 Elsevier Patient Education  2023 Elsevier Inc.  

## 2021-08-12 NOTE — Addendum Note (Signed)
Addended by: Everlean Cherry on: 08/12/2021 10:54 AM ? ? Modules accepted: Orders ? ?

## 2021-08-12 NOTE — Telephone Encounter (Signed)
Continuous Blood Gluc Transmit (DEXCOM G6 TRANSMITTER) MISC ?Continuous Blood Gluc Sensor (DEXCOM G6 SENSOR) MISC ? ?Sp[facility] [place] [facility] [address]. ?Pt no longer uses [facility] ?
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
## 2021-08-14 ENCOUNTER — Encounter (HOSPITAL_COMMUNITY): Payer: Medicare PPO | Admitting: Physical Therapy

## 2021-08-14 NOTE — Telephone Encounter (Signed)
Continuous Blood Gluc Transmit (DEXCOM G6 TRANSMITTER) MISC ?Continuous Blood Gluc Sensor (DEXCOM G6 SENSOR) MISC ?Continuous Blood Gluc Receiver ([place]) DEVI ? ?Sp[facility] calling. [facility]. Rx needs to be sent to [facility] mail order through pt insurance phone number [phone] ?

## 2021-08-15 ENCOUNTER — Telehealth: Payer: Self-pay | Admitting: Family Medicine

## 2021-08-15 DIAGNOSIS — E1149 Type 2 diabetes mellitus with other diabetic neurological complication: Secondary | ICD-10-CM

## 2021-08-15 MED ORDER — DEXCOM G6 SENSOR MISC: 1.0000 | 11 refills | Status: DC

## 2021-08-15 MED ORDER — DEXCOM G6 TRANSMITTER MISC: 1.0000 | Freq: Four times a day (QID) | 3 refills | Status: DC

## 2021-08-15 MED ORDER — DEXCOM G6 RECEIVER DEVI: 1.0000 | Freq: Four times a day (QID) | 3 refills | Status: DC

## 2021-08-15 NOTE — Telephone Encounter (Signed)
Dexcom sensor, receiver and transmitter need to be [facility] to [facility].  ? ?Rx sent. ? ?Wife also wants to know if [facility] can prescribe a Bariatric bed for pt. Advised that pt will need to be seen for this. It can be virtual or in office but that Dr. Warrick Parisian has to visually see the pt. ? ?Pt has a 30 minute appt scheduled for 5/15 ?

## 2021-08-15 NOTE — Telephone Encounter (Signed)
Please call back. Patient has questions about medications.  ?

## 2021-08-18 ENCOUNTER — Telehealth: Payer: Self-pay | Admitting: Family Medicine

## 2021-08-18 DIAGNOSIS — E1149 Type 2 diabetes mellitus with other diabetic neurological complication: Secondary | ICD-10-CM

## 2021-08-19 ENCOUNTER — Encounter (HOSPITAL_COMMUNITY): Payer: Medicare PPO | Admitting: Physical Therapy

## 2021-08-19 DIAGNOSIS — M542 Cervicalgia: Secondary | ICD-10-CM | POA: Diagnosis not present

## 2021-08-19 MED ORDER — DEXCOM G6 RECEIVER DEVI: 1.0000 | Freq: Four times a day (QID) | 3 refills | Status: DC

## 2021-08-19 MED ORDER — DEXCOM G6 SENSOR MISC: 1.0000 | 11 refills | Status: DC

## 2021-08-19 NOTE — Telephone Encounter (Signed)
Sent to [facility] in [place] ?

## 2021-08-20 ENCOUNTER — Telehealth: Payer: Self-pay | Admitting: Family Medicine

## 2021-08-20 NOTE — Telephone Encounter (Signed)
Wife called stating that she gave [place] the wrong fax # ? ?Need to fax incontinence supply order to [phone] [facility] in [place] ?

## 2021-08-20 NOTE — Telephone Encounter (Signed)
Faxed. Wife aware ?

## 2021-08-20 NOTE — Telephone Encounter (Signed)
More information is going to be needed than this.  Is he incontinent? Fecal, urinary? What size does he wear?  How many pre day will he need??  Usually insurance needs F2F visit for coverage but we can try. ?

## 2021-08-20 NOTE — Telephone Encounter (Signed)
Called and spoke with wife she said he needs it for incontinence because he can not get up and walk to use the bathroom for urinary and fecal. Patient uses about 10 a day. Size  she has been using 2x or xL. She is also wants the underpad's to be written for clothe. She is aware that this might not be covered with out a [id number]. Patient wife also states when he was in [facility] they prescribed amolodipne 5 mg and losartan 50 mg for his blood pressure but he is going to run out before his appointment. They called the rehab and they told them there was nothing they could do since he was no longer a patient there. She states they also took him off gabapentin metformin and tramadol. Patient was advised these are a lot of changes and has to come in and see PCP. That we have no documentation of these changes. Wife is still asking if we can send in the new BP meds. Please advise also cc to [facility] for his return   ?

## 2021-08-20 NOTE — Telephone Encounter (Signed)
Written rx completed.  If has enough pills through weekend, will defer med management per PCP since we have no notes.  Please work on getting them for him. ?

## 2021-08-20 NOTE — Telephone Encounter (Signed)
Pt aware sent to correct fax ?

## 2021-08-21 ENCOUNTER — Encounter (HOSPITAL_COMMUNITY): Payer: Medicare PPO

## 2021-08-21 NOTE — Telephone Encounter (Signed)
[facility] called [facility] this am and still have not received the rx. Please re-fax. ?

## 2021-08-21 NOTE — Telephone Encounter (Signed)
Pt aware I  faxed incontinence supplies ?

## 2021-08-26 DIAGNOSIS — M6281 Muscle weakness (generalized): Secondary | ICD-10-CM | POA: Diagnosis not present

## 2021-08-26 DIAGNOSIS — R1312 Dysphagia, oropharyngeal phase: Secondary | ICD-10-CM | POA: Diagnosis not present

## 2021-08-26 DIAGNOSIS — G629 Polyneuropathy, unspecified: Secondary | ICD-10-CM | POA: Diagnosis not present

## 2021-08-31 DIAGNOSIS — G629 Polyneuropathy, unspecified: Secondary | ICD-10-CM | POA: Diagnosis not present

## 2021-08-31 DIAGNOSIS — M6281 Muscle weakness (generalized): Secondary | ICD-10-CM | POA: Diagnosis not present

## 2021-08-31 DIAGNOSIS — J9610 Chronic respiratory failure, unspecified whether with hypoxia or hypercapnia: Secondary | ICD-10-CM | POA: Diagnosis not present

## 2021-08-31 DIAGNOSIS — R1312 Dysphagia, oropharyngeal phase: Secondary | ICD-10-CM | POA: Diagnosis not present

## 2021-08-31 DIAGNOSIS — R262 Difficulty in walking, not elsewhere classified: Secondary | ICD-10-CM | POA: Diagnosis not present

## 2021-09-01 ENCOUNTER — Ambulatory Visit: Payer: Medicare PPO | Admitting: Neurology

## 2021-09-03 ENCOUNTER — Other Ambulatory Visit: Payer: Self-pay

## 2021-09-03 ENCOUNTER — Telehealth: Payer: Self-pay | Admitting: Family Medicine

## 2021-09-03 DIAGNOSIS — E1149 Type 2 diabetes mellitus with other diabetic neurological complication: Secondary | ICD-10-CM

## 2021-09-03 MED ORDER — DEXCOM G6 RECEIVER DEVI: 1.0000 | Freq: Four times a day (QID) | 3 refills | Status: DC

## 2021-09-03 MED ORDER — DEXCOM G6 TRANSMITTER MISC: 1.0000 | Freq: Four times a day (QID) | 3 refills | Status: DC

## 2021-09-03 MED ORDER — DEXCOM G6 SENSOR MISC: 1.0000 | 11 refills | Status: DC

## 2021-09-03 NOTE — Telephone Encounter (Signed)
Called to let us know that [facility] has not received pts Dexcom order.  ?

## 2021-09-03 NOTE — Telephone Encounter (Signed)
Printed scripts and faxed to number provided. ?

## 2021-09-08 ENCOUNTER — Ambulatory Visit (INDEPENDENT_AMBULATORY_CARE_PROVIDER_SITE_OTHER): Payer: Medicare PPO | Admitting: Family Medicine

## 2021-09-08 ENCOUNTER — Encounter: Payer: Self-pay | Admitting: Family Medicine

## 2021-09-08 VITALS — BP 103/73 | HR 97 | Ht 75.0 in

## 2021-09-08 DIAGNOSIS — S32040D Wedge compression fracture of fourth lumbar vertebra, subsequent encounter for fracture with routine healing: Secondary | ICD-10-CM | POA: Diagnosis not present

## 2021-09-08 DIAGNOSIS — R3981 Functional urinary incontinence: Secondary | ICD-10-CM

## 2021-09-08 MED ORDER — TRAMADOL HCL 50 MG PO TABS: 50.0000 mg | ORAL_TABLET | Freq: Two times a day (BID) | ORAL | 0 refills | Status: DC | PRN

## 2021-09-08 MED ORDER — KETOROLAC TROMETHAMINE 60 MG/2ML IM SOLN
60.0000 mg | Freq: Once | INTRAMUSCULAR | Status: AC
  Administered 2021-09-08: 60 mg via INTRAMUSCULAR

## 2021-09-08 NOTE — Patient Instructions (Addendum)
[place], [place] [address] ? ?Phone: [phone] ? ?Fax: [phone] ?

## 2021-09-08 NOTE — Progress Notes (Signed)
? ?BP 103/73   Pulse 97   Ht '6\' 3"'$  (1.905 m)   SpO2 98%   BMI 34.06 kg/m?   ? ?Subjective:  ? ?Patient ID: [name]., male    DOB: August 15, 1955, 66 y.o.   MRN: [id number] ? ?HPI: ?[name]. is a 66 y.o. male presenting on 09/08/2021 for Hospitalization Follow-up (Fall/UTI), Back Pain, and Urinary Incontinence ? ? ?HPI ?Back pain ?Patient is coming in for back pain.  He is seeing different surgeons and he had a compression fracture in his L4 region which is causing nerve impingement and now causing urinary incontinence and bladder issues.  They are concerned that it may be cancer that led to this and not trauma and are now sending him to a specialist at [facility] for further testing.  He is a still having a lot of pain to where he cannot get comfortable sitting in a chair or getting up but they are concerned because they think he did have a possible reaction to tramadol and that he cannot take stronger opiates because he had that reaction but he also had a urinary tract infection at the same time so the reaction may have been from that.  His reaction was that he had some confusion.  His back pain is described as a tendency.  In his lower back and sometimes shoots down his legs on both sides. ? ?Patient has been having difficulty with functional urinary continence since this compression and he cannot feel when he goes at all sweats himself at any point has no control over it.  They need a prescription for depends and want to try condom catheter if needed wipes and cleansing for this as well and gloves.  They are coming in today to get an order for this. ? ?Relevant past medical, surgical, family and social history reviewed and updated as indicated. Interim medical history since our last visit reviewed. ?Allergies and medications reviewed and updated. ? ?Review of Systems  ?Constitutional:  Negative for chills and fever.  ?Respiratory:  Negative for shortness of breath and wheezing.   ?Cardiovascular:   Negative for chest pain and leg swelling.  ?Musculoskeletal:  Positive for arthralgias and back pain. Negative for gait problem.  ?Skin:  Negative for rash.  ?All other systems reviewed and are negative. ? ?Per HPI unless specifically indicated above ? ? ?Allergies as of 09/08/2021   ? ?   Reactions  ? Bee Venom   ? ?  ? ?  ?Medication List  ?  ? ?  ? Accurate as of Sep 08, 2021  4:46 PM. If you have any questions, ask your nurse or doctor.  ?  ?  ? ?  ? ?STOP taking these medications   ? ?baclofen 10 MG tablet ?Commonly known as: LIORESAL ?Stopped by: [name], [name] ?  ?doxycycline 100 MG capsule ?Commonly known as: Vibramycin ?Stopped by: [name], [name] ?  ?DULoxetine 60 MG capsule ?Commonly known as: Cymbalta ?Stopped by: [name], [name] ?  ?gabapentin 400 MG capsule ?Commonly known as: NEURONTIN ?Stopped by: [name], [name] ?  ?metFORMIN 500 MG 24 hr tablet ?Commonly known as: GLUCOPHAGE-XR ?Stopped by: [name], [name] ?  ? ?  ? ?TAKE these medications   ? ?amLODipine 5 MG tablet ?Commonly known as: NORVASC ?Take 1 tablet by mouth daily. ?  ?aspirin EC 81 MG tablet ?Take 81 mg by mouth daily. Swallow whole. ?  ?atorvastatin 20  MG tablet ?Commonly known as: LIPITOR ?TAKE 1 TABLET EVERY DAY ?  ?Dexcom G6 Receiver Devi ?1 each by Does not apply route 4 (four) times daily. DX: E11.49 ?  ?Dexcom G6 Sensor Misc ?1 each by Does not apply route once a week. ?  ?Dexcom G6 Transmitter Misc ?1 each by Does not apply route 4 (four) times daily. ?  ?diclofenac Sodium 1 % Gel ?Commonly known as: VOLTAREN ?Apply topically 4 (four) times daily. ?  ?GNP UltiCare Pen Needles 32G X 6 MM Misc ?Generic drug: Insulin Pen Needle ?EVERY DAY ?  ?insulin glargine 100 UNIT/ML injection ?Commonly known as: LANTUS ?Inject 10 Units into the skin every morning. ?What changed: Another medication with the same name was removed. Continue taking this medication, and follow the directions you see here. ?Changed  by: [name], [name] ?  ?losartan 50 MG tablet ?Commonly known as: COZAAR ?Take 1 tablet by mouth daily. ?  ?Melatonin 10 MG Tbcr ?Take by mouth at bedtime. ?  ?nitroGLYCERIN 0.4 MG SL tablet ?Commonly known as: NITROSTAT ?Place 0.4 mg under the tongue every 5 (five) minutes as needed for chest pain. ?  ?NovoLOG FlexPen 100 UNIT/ML FlexPen ?Generic drug: insulin aspart ?Inject 100 Units into the skin as directed. Sliding scale ?  ?traMADol 50 MG tablet ?Commonly known as: ULTRAM ?Take 1 tablet (50 mg total) by mouth every 12 (twelve) hours as needed. ?What changed:  ?when to take this ?reasons to take this ?Changed by: [name], [name] ?  ?Trulicity 1.5 LF/8.1OF Sopn ?Generic drug: Dulaglutide ?Inject 1.5 mg into the skin once a week. ?What changed: Another medication with the same name was removed. Continue taking this medication, and follow the directions you see here. ?Changed by: [name], [name] ?  ? ?  ? ?  ?  ? ? ?  ?Durable Medical Equipment  ?(From admission, onward)  ?  ? ? ?  ? ?  Start     Ordered  ? 09/08/21 0000  For home use only DME Other see comment       ?Comments: Functional urine incontinence ?Condom catheters, 5/day with bag and tubing and supplies for cleansing  ?Question:  Length of Need  Answer:  Lifetime  ? 09/08/21 1623  ? ?  ?  ? ?  ? ? ? ?Objective:  ? ?BP 103/73   Pulse 97   Ht '6\' 3"'$  (1.905 m)   SpO2 98%   BMI 34.06 kg/m?   ?Wt Readings from Last 3 Encounters:  ?07/10/21 272 lb 8 oz (123.6 kg)  ?05/20/21 249 lb (112.9 kg)  ?05/15/21 249 lb (112.9 kg)  ?  ?Physical Exam ?Vitals and nursing note reviewed.  ?Constitutional:   ?   General: He is not in acute distress. ?   Appearance: He is well-developed. He is not diaphoretic.  ?Eyes:  ?   General: No scleral icterus. ?Skin: ?   General: Skin is warm and dry.  ?   Findings: No rash.  ?Neurological:  ?   Mental Status: He is alert and oriented to person, place, and time.  ?   Coordination: Coordination normal.   ?Psychiatric:     ?   Behavior: Behavior normal.  ? ? ? ? ?Assessment & Plan:  ? ?Problem List Items Addressed This Visit   ?None ?Visit Diagnoses   ? ? Functional urinary incontinence    -  Primary  ? Relevant Orders  ? For home use only DME  Other see comment  ? Closed compression fracture of L4 lumbar vertebra with routine healing, subsequent encounter      ? Relevant Medications  ? ketorolac (TORADOL) injection 60 mg  ? traMADol (ULTRAM) 50 MG tablet  ? ?  ?Patient has functional urinary incontinence, will do prescription for adult briefs/diapers and under pads and moisturizers  ? ?prescription for condom catheters ? ? ?Follow up plan: ?Return if symptoms worsen or fail to improve. ? ?Counseling provided for all of the vaccine components ?Orders Placed This Encounter  ?Procedures  ? For home use only DME Other see comment  ? ? ?Caryl Pina, [name] ?[place] ?09/08/2021, 4:46 PM ? ? ?  ?

## 2021-09-18 ENCOUNTER — Telehealth: Payer: Self-pay | Admitting: Family Medicine

## 2021-09-18 DIAGNOSIS — E1169 Type 2 diabetes mellitus with other specified complication: Secondary | ICD-10-CM | POA: Diagnosis not present

## 2021-09-18 DIAGNOSIS — X58XXXA Exposure to other specified factors, initial encounter: Secondary | ICD-10-CM | POA: Diagnosis not present

## 2021-09-18 DIAGNOSIS — E785 Hyperlipidemia, unspecified: Secondary | ICD-10-CM | POA: Diagnosis not present

## 2021-09-18 DIAGNOSIS — S32041A Stable burst fracture of fourth lumbar vertebra, initial encounter for closed fracture: Secondary | ICD-10-CM | POA: Diagnosis not present

## 2021-09-24 ENCOUNTER — Telehealth: Payer: Self-pay | Admitting: Family Medicine

## 2021-09-24 NOTE — Telephone Encounter (Signed)
Aware ppw has been received and will be faxed back as soon as possible

## 2021-09-24 NOTE — Telephone Encounter (Signed)
Form updated to be faxed back

## 2021-09-25 ENCOUNTER — Telehealth: Payer: Self-pay | Admitting: Family Medicine

## 2021-09-26 DIAGNOSIS — M6281 Muscle weakness (generalized): Secondary | ICD-10-CM | POA: Diagnosis not present

## 2021-09-26 DIAGNOSIS — R1312 Dysphagia, oropharyngeal phase: Secondary | ICD-10-CM | POA: Diagnosis not present

## 2021-09-26 DIAGNOSIS — G629 Polyneuropathy, unspecified: Secondary | ICD-10-CM | POA: Diagnosis not present

## 2021-09-26 NOTE — Telephone Encounter (Signed)
Aware Dexcom order came over electronically and was completed today. [facility] waiting on them to send me new form as I have requested on 3 occasions.

## 2021-09-26 NOTE — Telephone Encounter (Signed)
Closing encounter. Answered in another encounter.

## 2021-09-30 DIAGNOSIS — E1149 Type 2 diabetes mellitus with other diabetic neurological complication: Secondary | ICD-10-CM | POA: Diagnosis not present

## 2021-10-01 DIAGNOSIS — R1312 Dysphagia, oropharyngeal phase: Secondary | ICD-10-CM | POA: Diagnosis not present

## 2021-10-01 DIAGNOSIS — G629 Polyneuropathy, unspecified: Secondary | ICD-10-CM | POA: Diagnosis not present

## 2021-10-01 DIAGNOSIS — J9610 Chronic respiratory failure, unspecified whether with hypoxia or hypercapnia: Secondary | ICD-10-CM | POA: Diagnosis not present

## 2021-10-01 DIAGNOSIS — M6281 Muscle weakness (generalized): Secondary | ICD-10-CM | POA: Diagnosis not present

## 2021-10-01 DIAGNOSIS — R262 Difficulty in walking, not elsewhere classified: Secondary | ICD-10-CM | POA: Diagnosis not present

## 2021-10-06 DIAGNOSIS — M4856XA Collapsed vertebra, not elsewhere classified, lumbar region, initial encounter for fracture: Secondary | ICD-10-CM | POA: Diagnosis not present

## 2021-10-06 DIAGNOSIS — S32041A Stable burst fracture of fourth lumbar vertebra, initial encounter for closed fracture: Secondary | ICD-10-CM | POA: Diagnosis not present

## 2021-10-07 ENCOUNTER — Ambulatory Visit: Payer: Medicare PPO | Admitting: Neurology

## 2021-10-08 ENCOUNTER — Ambulatory Visit: Payer: Medicare PPO | Admitting: Family Medicine

## 2021-10-14 ENCOUNTER — Telehealth: Payer: Self-pay | Admitting: Family Medicine

## 2021-10-15 NOTE — Telephone Encounter (Signed)
Please schedule an appointment, I do think I have some openings, please look at my schedule and see where we can fit him in

## 2021-10-16 DIAGNOSIS — M47816 Spondylosis without myelopathy or radiculopathy, lumbar region: Secondary | ICD-10-CM | POA: Diagnosis not present

## 2021-10-16 DIAGNOSIS — C9000 Multiple myeloma not having achieved remission: Secondary | ICD-10-CM | POA: Diagnosis not present

## 2021-10-16 DIAGNOSIS — G8929 Other chronic pain: Secondary | ICD-10-CM | POA: Diagnosis not present

## 2021-10-16 DIAGNOSIS — M545 Low back pain, unspecified: Secondary | ICD-10-CM | POA: Diagnosis not present

## 2021-10-16 DIAGNOSIS — M549 Dorsalgia, unspecified: Secondary | ICD-10-CM | POA: Diagnosis not present

## 2021-10-16 MED ORDER — LOSARTAN POTASSIUM 50 MG PO TABS
50.0000 mg | ORAL_TABLET | Freq: Every day | ORAL | 1 refills | Status: DC
Start: 2021-10-16 — End: 2021-11-08

## 2021-10-16 NOTE — Telephone Encounter (Signed)
Can you schedule CCM? Med assistance Telephone is fine  60 min  1st available

## 2021-10-16 NOTE — Telephone Encounter (Signed)
Pt is scheduled for 7/12. Offered 6/23 but pt has another appt that day.  Per wife a 30d supplyof Losartan needs to [facility] because pt is completely out and cannot wait on mail order.  Per wife pt does not have any Trulicity to take this week. She has called [employer] and was told that they need paperwork from [place].  He is currently taking Novolog and Lantus. 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  Will message Almyra Free to ask about [employer]

## 2021-10-20 DIAGNOSIS — C9000 Multiple myeloma not having achieved remission: Secondary | ICD-10-CM | POA: Diagnosis not present

## 2021-10-22 ENCOUNTER — Ambulatory Visit (INDEPENDENT_AMBULATORY_CARE_PROVIDER_SITE_OTHER): Payer: Medicare PPO | Admitting: Pharmacist

## 2021-10-22 DIAGNOSIS — E1149 Type 2 diabetes mellitus with other diabetic neurological complication: Secondary | ICD-10-CM

## 2021-10-22 DIAGNOSIS — E1169 Type 2 diabetes mellitus with other specified complication: Secondary | ICD-10-CM

## 2021-10-22 MED ORDER — TRULICITY 1.5 MG/0.5ML ~~LOC~~ SOAJ: 1.5000 mg | SUBCUTANEOUS | 2 refills | Status: DC

## 2021-10-22 MED ORDER — BASAGLAR KWIKPEN 100 UNIT/ML ~~LOC~~ SOPN: 40.0000 [IU] | PEN_INJECTOR | Freq: Every day | SUBCUTANEOUS | 11 refills | Status: DC

## 2021-10-22 MED ORDER — INSULIN LISPRO (1 UNIT DIAL) 100 UNIT/ML (KWIKPEN): 6.0000 [IU] | PEN_INJECTOR | Freq: Three times a day (TID) | SUBCUTANEOUS | 11 refills | Status: DC
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
## 2021-10-22 NOTE — Patient Instructions (Addendum)
Visit Information  Following are the goals we discussed today:  Current Barriers:  Unable to independently afford treatment regimen Unable to maintain control of T2DM Suboptimal therapeutic regimen for T2DM  Pharmacist Clinical Goal(s):  patient will verbalize ability to afford treatment regimen maintain control of T2DM as evidenced by NORMOGLYCEMIA  adhere to plan to optimize therapeutic regimen for T2DM as evidenced by report of adherence to recommended medication management changes through collaboration with PharmD and provider.   Interventions: 1:1 collaboration with Dettinger, Fransisca Kaufmann, MD regarding development and update of comprehensive plan of care as evidenced by provider attestation and co-signature Inter-disciplinary care team collaboration (see longitudinal plan of care) Comprehensive medication review performed; medication list updated in electronic medical record  Diabetes: New goal. Uncontrolled/--last a1c was 5.9% in 04/2021, but now patient has been in the hospital, then SNF where his medications were all changed.  He has been without his PCP prescribed insulin regimen and has not had trulicity in a few months.  His wife states his blood sugars have vastly increased compared to how well controlled he was prior to entering the hospital, etc. Restarting T2DM meds as follows: Basaglar 34-40 units daily Humalog per sliding scale Trulicity 1.'5mg'$  sq weekly For lilly cares patient assistance program, please escribe 4 month supplies to :  Denies personal and family history of Medullary thyroid cancer (MTC) Uses new Dexcom G7 CGM Gets CGM via [facility] via parachute Can be escribed in Epic  Change sensor every 10 days; Now worn on the arm Samples left up front for patient due to malfunction Parachute order screen  Current exercise: unable due to current cancer workup; can't walk Educated on restarting medications, dexcom Recommended trulicity, insulin regimen-->will  follow and adjust as needed Assessed patient finances. Application to be submitted to lilly cares patient assistance program for [employer], [employer] and Trulicity (pending)  Reviewed HLD meds (LDL is 27), kidney function is stable and blood pressure is controlled  Will continue to follow chronic conditions  Patient Goals/Self-Care Activities patient will:  - take medications as prescribed as evidenced by patient report and record review check glucose CONTINUOUSLY USING DEXCOM G7 CGM, document, and provide at future appointments collaborate with provider on medication access solutions target a minimum of 150 minutes of moderate intensity exercise weekly engage in dietary modifications by FOLLOWING A HEART HEALTHY DIET/HEALTHY PLATE METHOD .   Plan: Telephone follow up appointment with care management team member scheduled for:  3 months  Signature Regina Eck, PharmD, BCPS Clinical Pharmacist, [place]  II Phone [phone]   Please call the care guide team at [phone] if you need to cancel or reschedule your appointment.   Patient verbalizes understanding of instructions and care plan provided today and agrees to view in [place]. Active MyChart status and patient understanding of how to access instructions and care plan via MyChart confirmed with patient.

## 2021-10-24 DIAGNOSIS — E1149 Type 2 diabetes mellitus with other diabetic neurological complication: Secondary | ICD-10-CM

## 2021-10-24 DIAGNOSIS — E785 Hyperlipidemia, unspecified: Secondary | ICD-10-CM

## 2021-10-24 DIAGNOSIS — E1169 Type 2 diabetes mellitus with other specified complication: Secondary | ICD-10-CM

## 2021-10-26 DIAGNOSIS — R1312 Dysphagia, oropharyngeal phase: Secondary | ICD-10-CM | POA: Diagnosis not present

## 2021-10-26 DIAGNOSIS — M6281 Muscle weakness (generalized): Secondary | ICD-10-CM | POA: Diagnosis not present

## 2021-10-26 DIAGNOSIS — G629 Polyneuropathy, unspecified: Secondary | ICD-10-CM | POA: Diagnosis not present

## 2021-10-29 DIAGNOSIS — I129 Hypertensive chronic kidney disease with stage 1 through stage 4 chronic kidney disease, or unspecified chronic kidney disease: Secondary | ICD-10-CM | POA: Diagnosis not present

## 2021-10-29 DIAGNOSIS — M4856XA Collapsed vertebra, not elsewhere classified, lumbar region, initial encounter for fracture: Secondary | ICD-10-CM | POA: Diagnosis not present

## 2021-10-29 DIAGNOSIS — R32 Unspecified urinary incontinence: Secondary | ICD-10-CM | POA: Diagnosis not present

## 2021-10-29 DIAGNOSIS — M5441 Lumbago with sciatica, right side: Secondary | ICD-10-CM | POA: Diagnosis not present

## 2021-10-29 DIAGNOSIS — E1122 Type 2 diabetes mellitus with diabetic chronic kidney disease: Secondary | ICD-10-CM | POA: Diagnosis not present

## 2021-10-29 DIAGNOSIS — D892 Hypergammaglobulinemia, unspecified: Secondary | ICD-10-CM | POA: Diagnosis not present

## 2021-10-29 DIAGNOSIS — N189 Chronic kidney disease, unspecified: Secondary | ICD-10-CM | POA: Diagnosis not present

## 2021-10-29 DIAGNOSIS — S32040A Wedge compression fracture of fourth lumbar vertebra, initial encounter for closed fracture: Secondary | ICD-10-CM | POA: Diagnosis not present

## 2021-10-29 DIAGNOSIS — E1140 Type 2 diabetes mellitus with diabetic neuropathy, unspecified: Secondary | ICD-10-CM | POA: Diagnosis not present

## 2021-10-29 DIAGNOSIS — X58XXXA Exposure to other specified factors, initial encounter: Secondary | ICD-10-CM | POA: Diagnosis not present

## 2021-10-29 DIAGNOSIS — G8929 Other chronic pain: Secondary | ICD-10-CM | POA: Diagnosis not present

## 2021-10-29 DIAGNOSIS — R768 Other specified abnormal immunological findings in serum: Secondary | ICD-10-CM | POA: Diagnosis not present

## 2021-10-31 DIAGNOSIS — R1312 Dysphagia, oropharyngeal phase: Secondary | ICD-10-CM | POA: Diagnosis not present

## 2021-10-31 DIAGNOSIS — R262 Difficulty in walking, not elsewhere classified: Secondary | ICD-10-CM | POA: Diagnosis not present

## 2021-10-31 DIAGNOSIS — J9610 Chronic respiratory failure, unspecified whether with hypoxia or hypercapnia: Secondary | ICD-10-CM | POA: Diagnosis not present

## 2021-10-31 DIAGNOSIS — M6281 Muscle weakness (generalized): Secondary | ICD-10-CM | POA: Diagnosis not present

## 2021-10-31 DIAGNOSIS — G629 Polyneuropathy, unspecified: Secondary | ICD-10-CM | POA: Diagnosis not present

## 2021-11-04 DIAGNOSIS — G8929 Other chronic pain: Secondary | ICD-10-CM | POA: Diagnosis not present

## 2021-11-04 DIAGNOSIS — R768 Other specified abnormal immunological findings in serum: Secondary | ICD-10-CM | POA: Diagnosis not present

## 2021-11-04 DIAGNOSIS — M5441 Lumbago with sciatica, right side: Secondary | ICD-10-CM | POA: Diagnosis not present

## 2021-11-05 ENCOUNTER — Ambulatory Visit: Payer: Medicare PPO | Admitting: Family Medicine

## 2021-11-05 DIAGNOSIS — S32040A Wedge compression fracture of fourth lumbar vertebra, initial encounter for closed fracture: Secondary | ICD-10-CM | POA: Diagnosis not present

## 2021-11-05 DIAGNOSIS — D492 Neoplasm of unspecified behavior of bone, soft tissue, and skin: Secondary | ICD-10-CM | POA: Diagnosis not present

## 2021-11-05 DIAGNOSIS — G8929 Other chronic pain: Secondary | ICD-10-CM | POA: Diagnosis not present

## 2021-11-05 DIAGNOSIS — M5441 Lumbago with sciatica, right side: Secondary | ICD-10-CM | POA: Diagnosis not present

## 2021-11-05 DIAGNOSIS — R768 Other specified abnormal immunological findings in serum: Secondary | ICD-10-CM | POA: Diagnosis not present

## 2021-11-08 ENCOUNTER — Other Ambulatory Visit: Payer: Self-pay | Admitting: Family Medicine

## 2021-11-10 ENCOUNTER — Telehealth: Payer: Self-pay

## 2021-11-10 NOTE — Telephone Encounter (Signed)
Received notification from [place] regarding approval for TRULICITY 1.'5MG'$ /0.5ML. & [place] Patient assistance approved from 11/04/21 to 04/26/22.  MEDICATION WILL SHIP TO PT'S HOME  Phone: [phone]

## 2021-11-13 ENCOUNTER — Encounter: Payer: Self-pay | Admitting: Family Medicine

## 2021-11-13 ENCOUNTER — Ambulatory Visit (INDEPENDENT_AMBULATORY_CARE_PROVIDER_SITE_OTHER): Payer: Medicare PPO | Admitting: Family Medicine

## 2021-11-13 VITALS — BP 88/60 | HR 83 | Temp 97.2°F | Ht 75.0 in | Wt 265.0 lb

## 2021-11-13 DIAGNOSIS — S32040D Wedge compression fracture of fourth lumbar vertebra, subsequent encounter for fracture with routine healing: Secondary | ICD-10-CM | POA: Diagnosis not present

## 2021-11-13 DIAGNOSIS — E785 Hyperlipidemia, unspecified: Secondary | ICD-10-CM

## 2021-11-13 DIAGNOSIS — E1169 Type 2 diabetes mellitus with other specified complication: Secondary | ICD-10-CM

## 2021-11-13 DIAGNOSIS — E1149 Type 2 diabetes mellitus with other diabetic neurological complication: Secondary | ICD-10-CM

## 2021-11-13 DIAGNOSIS — I152 Hypertension secondary to endocrine disorders: Secondary | ICD-10-CM | POA: Diagnosis not present

## 2021-11-13 DIAGNOSIS — E1159 Type 2 diabetes mellitus with other circulatory complications: Secondary | ICD-10-CM

## 2021-11-13 LAB — BAYER DCA HB A1C WAIVED: HB A1C (BAYER DCA - WAIVED): 8.1 % — ABNORMAL HIGH (ref 4.8–5.6)

## 2021-11-13 MED ORDER — TRAMADOL HCL 50 MG PO TABS: 50.0000 mg | ORAL_TABLET | Freq: Two times a day (BID) | ORAL | 0 refills | Status: DC | PRN

## 2021-11-13 MED ORDER — LANTUS SOLOSTAR 100 UNIT/ML ~~LOC~~ SOPN: 30.0000 [IU] | PEN_INJECTOR | Freq: Every day | SUBCUTANEOUS | 3 refills | Status: DC

## 2021-11-13 MED ORDER — LOSARTAN POTASSIUM 50 MG PO TABS: 50.0000 mg | ORAL_TABLET | Freq: Every day | ORAL | 3 refills | Status: DC

## 2021-11-13 NOTE — Progress Notes (Signed)
BP (!) 88/60   Pulse 83   Temp (!) 97.2 F (36.2 C)   Ht _0  (1.905 m)   Wt 265 lb (120.2 kg)   SpO2 97%   BMI 33.12 kg/m    Subjective:   Patient ID: Ethan Price., male    DOB: 1956/01/06, 66 y.o.   MRN: [id number]  HPI: Ethan Deziel. is a 66 y.o. male presenting on 11/13/2021 for Medical Management of Chronic Issues, Urinary Incontinence, Diabetes, and closed compression fracture of back   HPI Type 2 diabetes mellitus Patient comes in today for recheck of his diabetes. Patient has been currently taking lantus 10 units, has not had his Trulicity in a while. Patient is currently on an ACE inhibitor/ARB. Patient has not seen an ophthalmologist this year. Patient denies any new issues with their feet. The symptom started onset as an adult hypertension and hyperlipidemia ARE RELATED TO DM.  Patient brings in Dexcom and his blood sugars have been easily over the 300s   Hypertension Patient is currently on losartan and amlodipine, and their blood pressure today is 88/60. Patient denies any lightheadedness or dizziness. Patient denies blurred vision, chest pains, shortness of breath, or weakness. Denies any side effects from medication and is content with current medication.  He has had some headaches but also his blood sugars have been very elevated  Hyperlipidemia Patient is coming in for recheck of his hyperlipidemia. The patient is currently taking atorvastatin. They deny any issues with myalgias or history of liver damage from it. They deny any focal numbness or weakness or chest pain.   Patient is seen [facility] oncology for his back and and they are thinking that he might have multiple myeloma the cause of the vertebral disc problem  Relevant past medical, surgical, family and social history reviewed and updated as indicated. Interim medical history since our last visit reviewed. Allergies and medications reviewed and updated.  Review of Systems  Constitutional:  Negative  for chills and fever.  Eyes:  Negative for visual disturbance.  Respiratory:  Negative for shortness of breath and wheezing.   Cardiovascular:  Negative for chest pain and leg swelling.  Musculoskeletal:  Positive for arthralgias and back pain. Negative for gait problem.  Skin:  Negative for rash.  Neurological:  Positive for headaches. Negative for dizziness and weakness.  All other systems reviewed and are negative.   Per HPI unless specifically indicated above   Allergies as of 11/13/2021       Reactions   Bee Venom         Medication List        Accurate as of November 13, 2021  2:18 PM. If you have any questions, ask your nurse or doctor.          amLODipine 5 MG tablet Commonly known as: NORVASC Take 1 tablet by mouth daily.   aspirin EC 81 MG tablet Take 81 mg by mouth daily. Swallow whole.   atorvastatin 20 MG tablet Commonly known as: LIPITOR TAKE 1 TABLET EVERY DAY   Basaglar KwikPen 100 UNIT/ML Inject 40 Units into the skin daily.   Dexcom G7 Sensor Misc by Does not apply route. Use to test blood sugar continuously. Change sensor on arm every 10 days as directed DX: E11.65. Fills at [facility] per medicare contracts   diclofenac Sodium 1 % Gel Commonly known as: VOLTAREN Apply topically 4 (four) times daily.   GNP UltiCare Pen Needles 32G X 6 MM  Misc Generic drug: Insulin Pen Needle EVERY DAY   insulin lispro 100 UNIT/ML KwikPen Commonly known as: HumaLOG KwikPen Inject 6-20 Units into the skin 3 (three) times daily with meals.   losartan 50 MG tablet Commonly known as: COZAAR TAKE 1 TABLET BY MOUTH EVERY DAY   Melatonin 10 MG Tbcr Take by mouth at bedtime.   nitroGLYCERIN 0.4 MG SL tablet Commonly known as: NITROSTAT Place 0.4 mg under the tongue every 5 (five) minutes as needed for chest pain.   traMADol 50 MG tablet Commonly known as: ULTRAM Take 1 tablet (50 mg total) by mouth every 12 (twelve) hours as needed.   Trulicity 1.5 JK/9.3OI  Sopn Generic drug: Dulaglutide Inject 1.5 mg into the skin once a week. DX: E11.65         Objective:   BP (!) 88/60   Pulse 83   Temp (!) 97.2 F (36.2 C)   Ht _0  (1.905 m)   Wt 265 lb (120.2 kg)   SpO2 97%   BMI 33.12 kg/m   Wt Readings from Last 3 Encounters:  11/13/21 265 lb (120.2 kg)  07/10/21 272 lb 8 oz (123.6 kg)  05/20/21 249 lb (112.9 kg)    Physical Exam Vitals and nursing note reviewed.  Constitutional:      General: He is not in acute distress.    Appearance: He is well-developed. He is not diaphoretic.  Eyes:     General: No scleral icterus.    Conjunctiva/sclera: Conjunctivae normal.  Neck:     Thyroid: No thyromegaly.  Cardiovascular:     Rate and Rhythm: Normal rate and regular rhythm.     Heart sounds: Normal heart sounds. No murmur heard. Pulmonary:     Effort: Pulmonary effort is normal. No respiratory distress.     Breath sounds: Normal breath sounds. No wheezing.  Musculoskeletal:        General: No swelling. Normal range of motion.     Cervical back: Neck supple.  Lymphadenopathy:     Cervical: No cervical adenopathy.  Skin:    General: Skin is warm and dry.     Findings: No rash.  Neurological:     Mental Status: He is alert and oriented to person, place, and time.     Coordination: Coordination normal.  Psychiatric:        Behavior: Behavior normal.       Assessment & Plan:   Problem List Items Addressed This Visit       Cardiovascular and Mediastinum   Hypertension associated with diabetes ([place])     Endocrine   Type 2 diabetes mellitus with neurological complications ([place]) - Primary   Relevant Orders   CBC with Differential/Platelet   Lipid panel   Bayer DCA Hb A1c Waived   Hyperlipidemia associated with type 2 diabetes mellitus ([place])   Relevant Orders   CBC with Differential/Platelet   Lipid panel   Bayer DCA Hb A1c Waived   Other Visit Diagnoses     Closed compression fracture of L4 lumbar vertebra with  routine healing, subsequent encounter           A1c is up at 8.1, we will restart the Trulicity and will have him increase his Lantus to 30 units daily and then give him instructions to go up by 2 units every 3 to 4 days if over 150 still until he gets it under 150 in the mornings   Follow up plan: Return in about 3 months (around  02/13/2022), or if symptoms worsen or fail to improve, for Diabetes recheck.  Counseling provided for all of the vaccine components Orders Placed This Encounter  Procedures   CBC with Differential/Platelet   Lipid panel   Bayer DCA Hb A1c [place], MD [facility] 11/13/2021, 2:18 PM

## 2021-11-14 LAB — CBC WITH DIFFERENTIAL/PLATELET
Basophils Absolute: 0.1 10*3/uL (ref 0.0–0.2)
Basos: 1 %
EOS (ABSOLUTE): 0.5 10*3/uL — ABNORMAL HIGH (ref 0.0–0.4)
Eos: 5 %
Hematocrit: 37.5 % (ref 37.5–51.0)
Hemoglobin: 12.4 g/dL — ABNORMAL LOW (ref 13.0–17.7)
Immature Grans (Abs): 0.1 10*3/uL (ref 0.0–0.1)
Immature Granulocytes: 1 %
Lymphocytes Absolute: 2 10*3/uL (ref 0.7–3.1)
Lymphs: 21 %
MCH: 28.8 pg (ref 26.6–33.0)
MCHC: 33.1 g/dL (ref 31.5–35.7)
MCV: 87 fL (ref 79–97)
Monocytes Absolute: 0.9 10*3/uL (ref 0.1–0.9)
Monocytes: 10 %
Neutrophils Absolute: 5.8 10*3/uL (ref 1.4–7.0)
Neutrophils: 62 %
Platelets: 275 10*3/uL (ref 150–450)
RBC: 4.3 x10E6/uL (ref 4.14–5.80)
RDW: 13.5 % (ref 11.6–15.4)
WBC: 9.3 10*3/uL (ref 3.4–10.8)

## 2021-11-14 LAB — LIPID PANEL
Chol/HDL Ratio: 2.3 ratio (ref 0.0–5.0)
Cholesterol, Total: 82 mg/dL — ABNORMAL LOW (ref 100–199)
HDL: 36 mg/dL — ABNORMAL LOW (ref >39)
LDL Chol Calc (NIH): 28 mg/dL (ref 0–99)
Triglycerides: 93 mg/dL (ref 0–149)
VLDL Cholesterol Cal: 18 mg/dL (ref 5–40)

## 2021-11-20 ENCOUNTER — Ambulatory Visit (INDEPENDENT_AMBULATORY_CARE_PROVIDER_SITE_OTHER): Payer: Medicare PPO | Admitting: Pharmacist

## 2021-11-20 ENCOUNTER — Encounter: Payer: Self-pay | Admitting: Pharmacist

## 2021-11-20 DIAGNOSIS — E1149 Type 2 diabetes mellitus with other diabetic neurological complication: Secondary | ICD-10-CM

## 2021-11-21 ENCOUNTER — Telehealth: Payer: Self-pay | Admitting: Family Medicine

## 2021-11-21 NOTE — Telephone Encounter (Signed)
Dr. Merita Norton last [place] note discusses Restarting Trulicity and increasing Lantus.  Novolog nor Humalog were discussed.  Per wife pt has been using the Trulicity, Lantus 30 units but increasing by 2 units q 3-4 days to keep blood sugars under 150. Also using a sliding scale of Novolog.  Wife states that blood sugars were 262 last night. This am was 196.   Wife also states that a shipment of Humalog is due to be shipped on Monday??  She is confused on what pt is supposed to be taking. Informed that Dettinger will be back on Monday and that the covering may not be comfortable making changes. Advised that we would get recommendations and call her back

## 2021-11-24 NOTE — Telephone Encounter (Signed)
He should only be on the Humalog or the NovoLog, not both.  The Trulicity and Lantus are ones that we are keeping and he is increasing the Lantus like he had said.  I only wants him to take either the NovoLog for the Humalog not both.

## 2021-11-24 NOTE — Telephone Encounter (Signed)
Per wife, pt is going to continue Novolog until the shipment of Humalog comes in. Then pt will be taking Humalog, Trulicity and Lantus.

## 2021-11-26 DIAGNOSIS — M6281 Muscle weakness (generalized): Secondary | ICD-10-CM | POA: Diagnosis not present

## 2021-11-26 DIAGNOSIS — R1312 Dysphagia, oropharyngeal phase: Secondary | ICD-10-CM | POA: Diagnosis not present

## 2021-11-26 DIAGNOSIS — G629 Polyneuropathy, unspecified: Secondary | ICD-10-CM | POA: Diagnosis not present

## 2021-11-26 MED ORDER — BASAGLAR KWIKPEN 100 UNIT/ML ~~LOC~~ SOPN: 30.0000 [IU] | PEN_INJECTOR | Freq: Every day | SUBCUTANEOUS | 11 refills | Status: DC

## 2021-11-26 NOTE — Progress Notes (Signed)
11/20/2021 Name: [name].    MRN: [id number]       DOB: June 27, 1955     S:  43 yoM presents for diabetes evaluation, education, and management.  Patient was admitted hospital in the past few months then transitioned to SNF (now home).  His a1c was 5.9% in 04/2021, but now patient has been in the hospital, then SNF where his medications were all changed.  He has been without his PCP prescribed insulin regimen and has not had trulicity in a few months.  His wife states his blood sugars have vastly increased compared to how well controlled he was prior to entering the hospital, etc.   Insurance coverage/medication affordability: medicare   Patient reports adherence with medications. Current diabetes medications include: metformin, trulicity, basaglar Current hypertension medications include: olmesartan Goal 130/80 Current hyperlipidemia medications include: atorvastatin   Patient denies hypoglycemic events.   Patient reported dietary habits: Eats 3 meals/day Discussed meal planning options and Plate method for healthy eating Avoid sugary drinks and desserts Incorporate balanced protein, non starchy veggies, 1 serving of carbohydrate Increase water intake Increase physical activity as able.   Patient-reported exercise habits: n/a   Patient denies nocturia (nighttime urination).   Patient denies neuropathy (nerve pain).   Patient denies visual changes.   Patient denies self foot exams.     O:   Recent Labs       Lab Results  Component Value Date    HGBA1C >14.0 (H) 11/21/2018      Lipid Panel   Labs (Brief)          Component Value Date/Time    CHOL 165 11/21/2018 1059    TRIG 120 11/21/2018 1059    HDL 47 11/21/2018 1059    CHOLHDL 3.5 11/21/2018 1059    LDLCALC 94 11/21/2018 1059         Home fasting blood sugars: n/a    2 hour post-meal/random blood sugars: n/a.   A/P:   Diabetes T2DM currently UNCONTROLLED.  A1C  8.1%  Diabetes: New  goal. Uncontrolled/--last a1c was 5.9% in 04/2021, but now patient has been in the hospital, then SNF where his medications were all changed.  He has been without his PCP prescribed insulin regimen and has not had trulicity in a few months.  His wife states his blood sugars have vastly increased compared to how well controlled he was prior to entering the hospital, etc. Continuing T2DM meds as follows: Basaglar 34-40 units daily (currently at 32 units daily Humalog per sliding scale Trulicity 1.'5mg'$  sq weekly For [employer] patient assistance program, please escribe 4 month supplies to :  Denies personal and family history of Medullary thyroid cancer (MTC) Uses new Dexcom G7 CGM Gets CGM via [facility] via parachute Can be escribed in Epic  Change sensor every 10 days; Now worn on the arm Samples left up front for patient due to malfunction Parachute order screen  Current exercise: unable due to current cancer workup; can't walk Educated on restarting medications, dexcom Recommended trulicity, insulin regimen-->will follow and adjust as needed Assessed patient finances. Application to be submitted to [employer] patient assistance program for [employer], [employer] and Trulicity (pending)   Reviewed HLD meds (LDL is 27), kidney function is stable and blood pressure is controlled    -Extensively discussed pathophysiology of diabetes, recommended lifestyle interventions, dietary effects on blood sugar control   -Counseled on s/sx of and management of hypoglycemia   -Next  A1C anticipated 3 months.     Written patient instructions provided.  Total time in face to face counseling 30 minutes.        Regina Eck, PharmD, BCPS Clinical Pharmacist, [place]  II Phone [phone]

## 2021-12-01 DIAGNOSIS — R262 Difficulty in walking, not elsewhere classified: Secondary | ICD-10-CM | POA: Diagnosis not present

## 2021-12-01 DIAGNOSIS — M9973 Connective tissue and disc stenosis of intervertebral foramina of lumbar region: Secondary | ICD-10-CM | POA: Diagnosis not present

## 2021-12-01 DIAGNOSIS — G8929 Other chronic pain: Secondary | ICD-10-CM | POA: Diagnosis not present

## 2021-12-01 DIAGNOSIS — M5441 Lumbago with sciatica, right side: Secondary | ICD-10-CM | POA: Diagnosis not present

## 2021-12-01 DIAGNOSIS — G629 Polyneuropathy, unspecified: Secondary | ICD-10-CM | POA: Diagnosis not present

## 2021-12-01 DIAGNOSIS — R32 Unspecified urinary incontinence: Secondary | ICD-10-CM | POA: Diagnosis not present

## 2021-12-01 DIAGNOSIS — J9610 Chronic respiratory failure, unspecified whether with hypoxia or hypercapnia: Secondary | ICD-10-CM | POA: Diagnosis not present

## 2021-12-01 DIAGNOSIS — R1312 Dysphagia, oropharyngeal phase: Secondary | ICD-10-CM | POA: Diagnosis not present

## 2021-12-01 DIAGNOSIS — M6281 Muscle weakness (generalized): Secondary | ICD-10-CM | POA: Diagnosis not present

## 2021-12-01 DIAGNOSIS — M48061 Spinal stenosis, lumbar region without neurogenic claudication: Secondary | ICD-10-CM | POA: Diagnosis not present

## 2021-12-01 DIAGNOSIS — S32040A Wedge compression fracture of fourth lumbar vertebra, initial encounter for closed fracture: Secondary | ICD-10-CM | POA: Diagnosis not present

## 2021-12-01 DIAGNOSIS — M8448XA Pathological fracture, other site, initial encounter for fracture: Secondary | ICD-10-CM | POA: Diagnosis not present

## 2021-12-01 DIAGNOSIS — D472 Monoclonal gammopathy: Secondary | ICD-10-CM | POA: Diagnosis not present

## 2021-12-17 ENCOUNTER — Telehealth: Payer: Self-pay | Admitting: Family Medicine

## 2021-12-18 NOTE — Telephone Encounter (Signed)
PT SCHEDULED

## 2021-12-19 DIAGNOSIS — E1149 Type 2 diabetes mellitus with other diabetic neurological complication: Secondary | ICD-10-CM | POA: Diagnosis not present

## 2021-12-22 ENCOUNTER — Encounter: Payer: Self-pay | Admitting: Family Medicine

## 2021-12-22 ENCOUNTER — Ambulatory Visit (INDEPENDENT_AMBULATORY_CARE_PROVIDER_SITE_OTHER): Payer: Medicare PPO | Admitting: Family Medicine

## 2021-12-22 VITALS — BP 95/68 | HR 82 | Temp 98.5°F | Resp 20 | Ht 75.0 in | Wt 265.0 lb

## 2021-12-22 DIAGNOSIS — M8448XA Pathological fracture, other site, initial encounter for fracture: Secondary | ICD-10-CM | POA: Diagnosis not present

## 2021-12-22 DIAGNOSIS — M5441 Lumbago with sciatica, right side: Secondary | ICD-10-CM | POA: Diagnosis not present

## 2021-12-22 DIAGNOSIS — M5442 Lumbago with sciatica, left side: Secondary | ICD-10-CM

## 2021-12-22 DIAGNOSIS — Z79899 Other long term (current) drug therapy: Secondary | ICD-10-CM

## 2021-12-22 MED ORDER — OXYCODONE-ACETAMINOPHEN 10-325 MG PO TABS: 1.0000 | ORAL_TABLET | Freq: Two times a day (BID) | ORAL | 0 refills | Status: DC | PRN

## 2021-12-22 NOTE — Progress Notes (Signed)
BP 95/68   Pulse 82   Temp 98.5 F (36.9 C)   Resp 20   Ht '6\' 3"'$  (1.905 m)   Wt 265 lb (120.2 kg)   SpO2 97%   BMI 33.12 kg/m    Subjective:   Patient ID: Ethan Price., male    DOB: Oct 26, 1955, 66 y.o.   MRN: [id number]  HPI: Ethan Knecht. is a 66 y.o. male presenting on 12/22/2021 for Back Pain (Follow up from [facility] and new DX )   HPI Pain assessment: Cause of pain-lumbar spinal injury likely due to a tumor Pain location-lower back Pain on scale of 1-10- 5 Frequency-sitting and moving and is waking him up at night What increases pain- What makes pain Better-nothing right now, the tramadol is not working anymore Effects on ADL -is very limited to his wheelchair at this point Any change in general medical condition-none  Current opioids rx-was on tramadol, will switch to Percocet 10-3 25 twice daily as needed # meds rx- 60 Effectiveness of current meds-tramadol was not working so we are switching Adverse reactions from pain meds-none Morphine equivalent-30  Pill count performed-No Last drug screen -N/A ( high risk q67m moderate risk q649mlow risk yearly ) Urine drug screen today- Yes Was the N[place]imonium[place]iew[place]-yes  If yes were their any concerning findings? -None  Pain contract signed on: Today  Relevant past medical, surgical, family and social history reviewed and updated as indicated. Interim medical history since our last visit reviewed. Allergies and medications reviewed and updated.  Review of Systems  Constitutional:  Negative for chills and fever.  Respiratory:  Negative for shortness of breath and wheezing.   Cardiovascular:  Negative for chest pain and leg swelling.  Musculoskeletal:  Positive for arthralgias, back pain, gait problem and myalgias.  Skin:  Negative for rash.  All other systems reviewed and are negative.   Per HPI unless specifically indicated above   Allergies as of 12/22/2021       Reactions   Bee Venom          Medication List        Accurate as of December 22, 2021  3:44 PM. If you have any questions, ask your nurse or doctor.          STOP taking these medications    traMADol 50 MG tablet Commonly known as: ULTRAM Stopped by: JoFransisca Kaufmannettinger, MD       TAKE these medications    aspirin EC 81 MG tablet Take 81 mg by mouth daily. Swallow whole.   atorvastatin 20 MG tablet Commonly known as: LIPITOR TAKE 1 TABLET EVERY DAY   Basaglar KwikPen 100 UNIT/ML Inject 30-40 Units into the skin daily.   Dexcom G7 Sensor Misc by Does not apply route. Use to test blood sugar continuously. Change sensor on arm every 10 days as directed DX: E11.65. Fills at [facility] per medicare contracts   diclofenac Sodium 1 % Gel Commonly known as: VOLTAREN Apply topically 4 (four) times daily.   GNP UltiCare Pen Needles 32G X 6 MM Misc Generic drug: Insulin Pen Needle EVERY DAY   insulin lispro 100 UNIT/ML KwikPen Commonly known as: HumaLOG KwikPen Inject 6-20 Units into the skin 3 (three) times daily with meals.   losartan 50 MG tablet Commonly known as: COZAAR Take 1 tablet (50 mg total) by mouth daily.   Melatonin 10 MG Tbcr Take by mouth at bedtime.   nitroGLYCERIN 0.4 MG SL tablet  Commonly known as: NITROSTAT Place 0.4 mg under the tongue every 5 (five) minutes as needed for chest pain.   oxyCODONE-acetaminophen 10-325 MG tablet Commonly known as: PERCOCET Take 1 tablet by mouth 2 (two) times daily as needed for pain. Started by: Worthy Rancher, MD   Trulicity 1.5 YB/6.3SL Sopn Generic drug: Dulaglutide Inject 1.5 mg into the skin once a week. DX: E11.65         Objective:   BP 95/68   Pulse 82   Temp 98.5 F (36.9 C)   Resp 20   Ht '6\' 3"'$  (1.905 m)   Wt 265 lb (120.2 kg)   SpO2 97%   BMI 33.12 kg/m   Wt Readings from Last 3 Encounters:  12/22/21 265 lb (120.2 kg)  11/13/21 265 lb (120.2 kg)  07/10/21 272 lb 8 oz (123.6 kg)    Physical Exam Vitals and  nursing note reviewed.  Constitutional:      General: He is not in acute distress.    Appearance: He is well-developed. He is not diaphoretic.  Eyes:     General: No scleral icterus.    Conjunctiva/sclera: Conjunctivae normal.  Neck:     Thyroid: No thyromegaly.  Musculoskeletal:     Cervical back: Neck supple.     Comments: Wheelchair-bound at this point  Lymphadenopathy:     Cervical: No cervical adenopathy.  Skin:    General: Skin is warm and dry.     Findings: No rash.  Neurological:     Mental Status: He is alert.     Coordination: Coordination normal.       Assessment & Plan:   Problem List Items Addressed This Visit       Musculoskeletal and Integument   Pathologic fracture   Relevant Medications   oxyCODONE-acetaminophen (PERCOCET) 10-325 MG tablet   Other Relevant Orders   Ambulatory referral to Hematology / Oncology     Other   Low back pain   Relevant Medications   oxyCODONE-acetaminophen (PERCOCET) 10-325 MG tablet   Other Relevant Orders   Ambulatory referral to Hematology / Oncology   Other Visit Diagnoses     Controlled substance agreement signed    -  Primary   Relevant Medications   oxyCODONE-acetaminophen (PERCOCET) 10-325 MG tablet   Other Relevant Orders   ToxASSURE Select 13 (MW), Urine   Ambulatory referral to Hematology / Oncology     We will switch from tramadol to oxycodone/Percocet and see how he does.  Follow up plan: Return in about 4 weeks (around 01/19/2022), or if symptoms worsen or fail to improve, for Back pain recheck.  Counseling provided for all of the vaccine components Orders Placed This Encounter  Procedures   ToxASSURE Select 13 (MW), Urine   Ambulatory referral to Hematology / [place] Taner Rzepka, MD [facility] 12/22/2021, 3:44 PM

## 2021-12-25 LAB — TOXASSURE SELECT 13 (MW), URINE

## 2021-12-27 DIAGNOSIS — R1312 Dysphagia, oropharyngeal phase: Secondary | ICD-10-CM | POA: Diagnosis not present

## 2021-12-27 DIAGNOSIS — M6281 Muscle weakness (generalized): Secondary | ICD-10-CM | POA: Diagnosis not present

## 2021-12-27 DIAGNOSIS — G629 Polyneuropathy, unspecified: Secondary | ICD-10-CM | POA: Diagnosis not present

## 2022-01-01 DIAGNOSIS — R262 Difficulty in walking, not elsewhere classified: Secondary | ICD-10-CM | POA: Diagnosis not present

## 2022-01-01 DIAGNOSIS — R1312 Dysphagia, oropharyngeal phase: Secondary | ICD-10-CM | POA: Diagnosis not present

## 2022-01-01 DIAGNOSIS — M6281 Muscle weakness (generalized): Secondary | ICD-10-CM | POA: Diagnosis not present

## 2022-01-01 DIAGNOSIS — G629 Polyneuropathy, unspecified: Secondary | ICD-10-CM | POA: Diagnosis not present

## 2022-01-01 DIAGNOSIS — J9610 Chronic respiratory failure, unspecified whether with hypoxia or hypercapnia: Secondary | ICD-10-CM | POA: Diagnosis not present

## 2022-01-06 DIAGNOSIS — M5116 Intervertebral disc disorders with radiculopathy, lumbar region: Secondary | ICD-10-CM | POA: Diagnosis not present

## 2022-01-06 DIAGNOSIS — M4726 Other spondylosis with radiculopathy, lumbar region: Secondary | ICD-10-CM | POA: Diagnosis not present

## 2022-01-06 DIAGNOSIS — M47816 Spondylosis without myelopathy or radiculopathy, lumbar region: Secondary | ICD-10-CM | POA: Diagnosis not present

## 2022-01-06 DIAGNOSIS — Z9889 Other specified postprocedural states: Secondary | ICD-10-CM | POA: Diagnosis not present

## 2022-01-12 ENCOUNTER — Other Ambulatory Visit: Payer: Self-pay | Admitting: Family Medicine

## 2022-01-12 DIAGNOSIS — E1149 Type 2 diabetes mellitus with other diabetic neurological complication: Secondary | ICD-10-CM

## 2022-01-14 DIAGNOSIS — E785 Hyperlipidemia, unspecified: Secondary | ICD-10-CM | POA: Diagnosis not present

## 2022-01-14 DIAGNOSIS — G9341 Metabolic encephalopathy: Secondary | ICD-10-CM | POA: Diagnosis not present

## 2022-01-14 DIAGNOSIS — I10 Essential (primary) hypertension: Secondary | ICD-10-CM | POA: Diagnosis not present

## 2022-01-14 DIAGNOSIS — Z01818 Encounter for other preprocedural examination: Secondary | ICD-10-CM | POA: Diagnosis not present

## 2022-01-14 DIAGNOSIS — I4891 Unspecified atrial fibrillation: Secondary | ICD-10-CM | POA: Diagnosis not present

## 2022-01-14 DIAGNOSIS — E119 Type 2 diabetes mellitus without complications: Secondary | ICD-10-CM | POA: Diagnosis not present

## 2022-01-14 DIAGNOSIS — Z981 Arthrodesis status: Secondary | ICD-10-CM | POA: Diagnosis not present

## 2022-01-14 DIAGNOSIS — M1990 Unspecified osteoarthritis, unspecified site: Secondary | ICD-10-CM | POA: Diagnosis not present

## 2022-01-14 DIAGNOSIS — E1140 Type 2 diabetes mellitus with diabetic neuropathy, unspecified: Secondary | ICD-10-CM | POA: Diagnosis not present

## 2022-01-14 DIAGNOSIS — G629 Polyneuropathy, unspecified: Secondary | ICD-10-CM | POA: Diagnosis not present

## 2022-01-14 DIAGNOSIS — M6281 Muscle weakness (generalized): Secondary | ICD-10-CM | POA: Diagnosis not present

## 2022-01-14 DIAGNOSIS — I4819 Other persistent atrial fibrillation: Secondary | ICD-10-CM | POA: Diagnosis not present

## 2022-01-14 DIAGNOSIS — J90 Pleural effusion, not elsewhere classified: Secondary | ICD-10-CM | POA: Diagnosis not present

## 2022-01-14 DIAGNOSIS — D4989 Neoplasm of unspecified behavior of other specified sites: Secondary | ICD-10-CM | POA: Diagnosis not present

## 2022-01-14 DIAGNOSIS — M40205 Unspecified kyphosis, thoracolumbar region: Secondary | ICD-10-CM | POA: Diagnosis not present

## 2022-01-14 DIAGNOSIS — Z794 Long term (current) use of insulin: Secondary | ICD-10-CM | POA: Diagnosis not present

## 2022-01-14 DIAGNOSIS — M532X6 Spinal instabilities, lumbar region: Secondary | ICD-10-CM | POA: Diagnosis not present

## 2022-01-14 DIAGNOSIS — R1312 Dysphagia, oropharyngeal phase: Secondary | ICD-10-CM | POA: Diagnosis not present

## 2022-01-14 DIAGNOSIS — F05 Delirium due to known physiological condition: Secondary | ICD-10-CM | POA: Diagnosis not present

## 2022-01-14 DIAGNOSIS — M8468XS Pathological fracture in other disease, other site, sequela: Secondary | ICD-10-CM | POA: Diagnosis not present

## 2022-01-14 DIAGNOSIS — M8448XA Pathological fracture, other site, initial encounter for fracture: Secondary | ICD-10-CM | POA: Diagnosis not present

## 2022-01-14 DIAGNOSIS — E7800 Pure hypercholesterolemia, unspecified: Secondary | ICD-10-CM | POA: Diagnosis not present

## 2022-01-14 DIAGNOSIS — E1169 Type 2 diabetes mellitus with other specified complication: Secondary | ICD-10-CM | POA: Diagnosis not present

## 2022-01-14 DIAGNOSIS — C9000 Multiple myeloma not having achieved remission: Secondary | ICD-10-CM | POA: Diagnosis not present

## 2022-01-14 DIAGNOSIS — I129 Hypertensive chronic kidney disease with stage 1 through stage 4 chronic kidney disease, or unspecified chronic kidney disease: Secondary | ICD-10-CM | POA: Diagnosis not present

## 2022-01-14 DIAGNOSIS — R410 Disorientation, unspecified: Secondary | ICD-10-CM | POA: Diagnosis not present

## 2022-01-14 DIAGNOSIS — N189 Chronic kidney disease, unspecified: Secondary | ICD-10-CM | POA: Diagnosis not present

## 2022-01-14 DIAGNOSIS — M48061 Spinal stenosis, lumbar region without neurogenic claudication: Secondary | ICD-10-CM | POA: Diagnosis not present

## 2022-01-14 DIAGNOSIS — D62 Acute posthemorrhagic anemia: Secondary | ICD-10-CM | POA: Diagnosis not present

## 2022-01-14 DIAGNOSIS — E1122 Type 2 diabetes mellitus with diabetic chronic kidney disease: Secondary | ICD-10-CM | POA: Diagnosis not present

## 2022-01-27 DIAGNOSIS — I4891 Unspecified atrial fibrillation: Secondary | ICD-10-CM | POA: Diagnosis not present

## 2022-01-28 ENCOUNTER — Encounter: Payer: Self-pay | Admitting: *Deleted

## 2022-01-28 ENCOUNTER — Telehealth: Payer: Self-pay | Admitting: *Deleted

## 2022-01-28 DIAGNOSIS — Z981 Arthrodesis status: Secondary | ICD-10-CM | POA: Diagnosis not present

## 2022-01-28 DIAGNOSIS — I129 Hypertensive chronic kidney disease with stage 1 through stage 4 chronic kidney disease, or unspecified chronic kidney disease: Secondary | ICD-10-CM | POA: Diagnosis not present

## 2022-01-28 DIAGNOSIS — E1122 Type 2 diabetes mellitus with diabetic chronic kidney disease: Secondary | ICD-10-CM | POA: Diagnosis not present

## 2022-01-28 DIAGNOSIS — I4891 Unspecified atrial fibrillation: Secondary | ICD-10-CM | POA: Diagnosis not present

## 2022-01-28 DIAGNOSIS — M8448XD Pathological fracture, other site, subsequent encounter for fracture with routine healing: Secondary | ICD-10-CM | POA: Diagnosis not present

## 2022-01-28 DIAGNOSIS — N189 Chronic kidney disease, unspecified: Secondary | ICD-10-CM | POA: Diagnosis not present

## 2022-01-28 NOTE — Patient Outreach (Signed)
  Care Coordination [facility] Note Transition Care Management Follow-up Telephone Call Date of discharge and from where: 01/27/22 from [place] How have you been since you were released from the hospital? Per wife, "he's hanging in there. He's real sore" Any questions or concerns? No  Items Reviewed: Did the pt receive and understand the discharge instructions provided? Yes  Medications obtained and verified? Yes  Other? No  Any new allergies since your discharge? No  Dietary orders reviewed? Yes Do you have support at home? Yes   Home Care and Equipment/Supplies: Were home health services ordered? yes If so, what is the name of the agency? [facility] [place] up a time to come to the patient's home? yes Were any new equipment or medical supplies ordered?  No What is the name of the medical supply agency? N/a Were you able to get the supplies/equipment? not applicable Do you have any questions related to the use of the equipment or supplies? No  Functional Questionnaire: (I = Independent and D = Dependent) ADLs: D  Bathing/Dressing- D  Meal Prep- D  Eating- D  Maintaining continence- I  Transferring/Ambulation- D  Managing Meds- D  Follow upD appointments reviewed:  PCP Hospital f/u appt confirmed? No  not indicated. Scheduled for routine f/u with Dr Dettinger on 02/13/22 at 1:10 [facility] f/u appt confirmed? Yes  Scheduled to see [facility] on 02/04/22 @ 1:00, Dr Maylene Roes ([facility]) 02/06/22 at 1:15, Dr Dwana Curd ([place] [facility]) 02/06/22 at 9:00, Dr Merleen Nicely ([place]) 02/09/22 at 10:00 Are transportation arrangements needed? No  If their condition worsens, is the pt aware to call PCP or go to the Emergency Dept.? Yes Was the patient provided with contact information for the PCP's office or ED? Yes Was to pt encouraged to call back with questions or concerns? Yes  SDOH assessments and interventions completed:   Yes  Care Coordination  Interventions Activated:  No   Care Coordination Interventions:  No Care Coordination interventions needed at this time.   Encounter Outcome:  Pt. Visit Completed    Chong Sicilian, BSN, RN-BC RN Care Coordinator [place] Direct Dial: [phone] Main #: [phone]

## 2022-01-31 DIAGNOSIS — M6281 Muscle weakness (generalized): Secondary | ICD-10-CM | POA: Diagnosis not present

## 2022-01-31 DIAGNOSIS — R1312 Dysphagia, oropharyngeal phase: Secondary | ICD-10-CM | POA: Diagnosis not present

## 2022-01-31 DIAGNOSIS — J9610 Chronic respiratory failure, unspecified whether with hypoxia or hypercapnia: Secondary | ICD-10-CM | POA: Diagnosis not present

## 2022-01-31 DIAGNOSIS — G629 Polyneuropathy, unspecified: Secondary | ICD-10-CM | POA: Diagnosis not present

## 2022-01-31 DIAGNOSIS — R262 Difficulty in walking, not elsewhere classified: Secondary | ICD-10-CM | POA: Diagnosis not present

## 2022-02-02 DIAGNOSIS — M8448XD Pathological fracture, other site, subsequent encounter for fracture with routine healing: Secondary | ICD-10-CM | POA: Diagnosis not present

## 2022-02-02 DIAGNOSIS — E1122 Type 2 diabetes mellitus with diabetic chronic kidney disease: Secondary | ICD-10-CM | POA: Diagnosis not present

## 2022-02-02 DIAGNOSIS — Z981 Arthrodesis status: Secondary | ICD-10-CM | POA: Diagnosis not present

## 2022-02-02 DIAGNOSIS — I4891 Unspecified atrial fibrillation: Secondary | ICD-10-CM | POA: Diagnosis not present

## 2022-02-02 DIAGNOSIS — I129 Hypertensive chronic kidney disease with stage 1 through stage 4 chronic kidney disease, or unspecified chronic kidney disease: Secondary | ICD-10-CM | POA: Diagnosis not present

## 2022-02-02 DIAGNOSIS — N189 Chronic kidney disease, unspecified: Secondary | ICD-10-CM | POA: Diagnosis not present

## 2022-02-04 DIAGNOSIS — Z4802 Encounter for removal of sutures: Secondary | ICD-10-CM | POA: Diagnosis not present

## 2022-02-04 DIAGNOSIS — Z4889 Encounter for other specified surgical aftercare: Secondary | ICD-10-CM | POA: Diagnosis not present

## 2022-02-05 DIAGNOSIS — E1122 Type 2 diabetes mellitus with diabetic chronic kidney disease: Secondary | ICD-10-CM | POA: Diagnosis not present

## 2022-02-05 DIAGNOSIS — N189 Chronic kidney disease, unspecified: Secondary | ICD-10-CM | POA: Diagnosis not present

## 2022-02-05 DIAGNOSIS — I4891 Unspecified atrial fibrillation: Secondary | ICD-10-CM | POA: Diagnosis not present

## 2022-02-05 DIAGNOSIS — M8448XD Pathological fracture, other site, subsequent encounter for fracture with routine healing: Secondary | ICD-10-CM | POA: Diagnosis not present

## 2022-02-05 DIAGNOSIS — I129 Hypertensive chronic kidney disease with stage 1 through stage 4 chronic kidney disease, or unspecified chronic kidney disease: Secondary | ICD-10-CM | POA: Diagnosis not present

## 2022-02-05 DIAGNOSIS — Z981 Arthrodesis status: Secondary | ICD-10-CM | POA: Diagnosis not present

## 2022-02-06 DIAGNOSIS — C9000 Multiple myeloma not having achieved remission: Secondary | ICD-10-CM | POA: Diagnosis not present

## 2022-02-06 DIAGNOSIS — C9030 Solitary plasmacytoma not having achieved remission: Secondary | ICD-10-CM | POA: Diagnosis not present

## 2022-02-06 DIAGNOSIS — R0609 Other forms of dyspnea: Secondary | ICD-10-CM | POA: Diagnosis not present

## 2022-02-06 DIAGNOSIS — G629 Polyneuropathy, unspecified: Secondary | ICD-10-CM | POA: Diagnosis not present

## 2022-02-09 DIAGNOSIS — C9000 Multiple myeloma not having achieved remission: Secondary | ICD-10-CM | POA: Diagnosis not present

## 2022-02-11 DIAGNOSIS — E7800 Pure hypercholesterolemia, unspecified: Secondary | ICD-10-CM | POA: Diagnosis not present

## 2022-02-11 DIAGNOSIS — I10 Essential (primary) hypertension: Secondary | ICD-10-CM | POA: Diagnosis not present

## 2022-02-11 DIAGNOSIS — M8458XD Pathological fracture in neoplastic disease, other specified site, subsequent encounter for fracture with routine healing: Secondary | ICD-10-CM | POA: Diagnosis not present

## 2022-02-11 DIAGNOSIS — G893 Neoplasm related pain (acute) (chronic): Secondary | ICD-10-CM | POA: Diagnosis not present

## 2022-02-11 DIAGNOSIS — C9000 Multiple myeloma not having achieved remission: Secondary | ICD-10-CM | POA: Diagnosis not present

## 2022-02-11 DIAGNOSIS — Z79899 Other long term (current) drug therapy: Secondary | ICD-10-CM | POA: Diagnosis not present

## 2022-02-11 DIAGNOSIS — M1990 Unspecified osteoarthritis, unspecified site: Secondary | ICD-10-CM | POA: Diagnosis not present

## 2022-02-11 DIAGNOSIS — E1140 Type 2 diabetes mellitus with diabetic neuropathy, unspecified: Secondary | ICD-10-CM | POA: Diagnosis not present

## 2022-02-11 DIAGNOSIS — N289 Disorder of kidney and ureter, unspecified: Secondary | ICD-10-CM | POA: Diagnosis not present

## 2022-02-13 ENCOUNTER — Encounter: Payer: Self-pay | Admitting: Family Medicine

## 2022-02-13 ENCOUNTER — Ambulatory Visit (INDEPENDENT_AMBULATORY_CARE_PROVIDER_SITE_OTHER): Payer: Medicare PPO | Admitting: Family Medicine

## 2022-02-13 VITALS — BP 89/61 | HR 74 | Temp 96.8°F | Ht 75.0 in | Wt 251.0 lb

## 2022-02-13 DIAGNOSIS — Z79899 Other long term (current) drug therapy: Secondary | ICD-10-CM

## 2022-02-13 DIAGNOSIS — C9000 Multiple myeloma not having achieved remission: Secondary | ICD-10-CM | POA: Insufficient documentation

## 2022-02-13 DIAGNOSIS — I152 Hypertension secondary to endocrine disorders: Secondary | ICD-10-CM

## 2022-02-13 DIAGNOSIS — E1149 Type 2 diabetes mellitus with other diabetic neurological complication: Secondary | ICD-10-CM

## 2022-02-13 DIAGNOSIS — E785 Hyperlipidemia, unspecified: Secondary | ICD-10-CM

## 2022-02-13 DIAGNOSIS — M8448XA Pathological fracture, other site, initial encounter for fracture: Secondary | ICD-10-CM | POA: Diagnosis not present

## 2022-02-13 DIAGNOSIS — M5441 Lumbago with sciatica, right side: Secondary | ICD-10-CM

## 2022-02-13 DIAGNOSIS — E1169 Type 2 diabetes mellitus with other specified complication: Secondary | ICD-10-CM

## 2022-02-13 DIAGNOSIS — M5442 Lumbago with sciatica, left side: Secondary | ICD-10-CM

## 2022-02-13 DIAGNOSIS — E1159 Type 2 diabetes mellitus with other circulatory complications: Secondary | ICD-10-CM | POA: Diagnosis not present

## 2022-02-13 LAB — BAYER DCA HB A1C WAIVED: HB A1C (BAYER DCA - WAIVED): 5.7 % — ABNORMAL HIGH (ref 4.8–5.6)

## 2022-02-13 MED ORDER — OXYCODONE-ACETAMINOPHEN 10-325 MG PO TABS: 1.0000 | ORAL_TABLET | Freq: Two times a day (BID) | ORAL | 0 refills | Status: DC | PRN

## 2022-02-13 MED ORDER — DULOXETINE HCL 30 MG PO CPEP: 30.0000 mg | ORAL_CAPSULE | Freq: Every day | ORAL | 1 refills | Status: DC

## 2022-02-13 NOTE — Progress Notes (Signed)
BP (!) 89/61   Pulse 74   Temp (!) 96.8 F (36 C)   Ht $R'6\' 3"'BZ$  (1.905 m)   Wt 251 lb (113.9 kg)   SpO2 98%   BMI 31.37 kg/m    Subjective:   Patient ID: [name]., male    DOB: 1955-09-20, 66 y.o.   MRN: [id number]  HPI: Holger Sokolowski. is a 66 y.o. male presenting on 02/13/2022 for Medical Management of Chronic Issues, Hyperlipidemia, Hypertension, Diabetes, Constipation (Miralax not helping), and Atrial Fibrillation (New diagnosis- started on Eliquis)   HPI Pain assessment: Cause of pain-spinal multiple myeloma and degeneration Pain location-lower back Pain on scale of 1-10- 8 Frequency-Daily What increases pain-movement sitting standing What makes pain Better-medication Effects on ADL -limits movement sitting standing for a long period of time Any change in general medical condition-recently diagnosed multiple myeloma  Current opioids rx-oxycodone/acetaminophen 10-3 25 twice daily as needed # meds rx-60/month Effectiveness of current meds-works well, just has constipation Adverse reactions from pain meds-constipation Morphine equivalent-30  Pill count performed-No Last drug screen -12/26/2021 ( high risk q49m, moderate risk q54m, low risk yearly ) Urine drug screen today- No Was the [place] reviewed-yes  If yes were their any concerning findings? -None except he did get a prescription from his surgeon after procedure  Pain contract signed on: 12/26/2021  Type 2 diabetes mellitus Patient comes in today for recheck of his diabetes. Patient has been currently taking Trulicity and Basaglar and Humalog. Patient is currently on an ACE inhibitor/ARB. Patient has not seen an ophthalmologist this year. Patient denies any issues with their feet. The symptom started onset as an adult hypertension and hyperlipidemia ARE RELATED TO DM   Hypertension Patient is currently on amiodarone and losartan, and their blood pressure today is 89/61. Patient denies any lightheadedness  or dizziness. Patient denies headaches, blurred vision, chest pains, shortness of breath, or weakness. Denies any side effects from medication and is content with current medication.   Anxiety Patient has a lot of anxiety correlated to his back pain being essentially bedbound and wheelchair-bound now and doing with the possibility of the cancer coming up and wants to try something to help with anxiety.  He denies any suicidal ideations.  He just gets the anxiety builds up quite frequently.  His wife says he needs help with something.  Relevant past medical, surgical, family and social history reviewed and updated as indicated. Interim medical history since our last visit reviewed. Allergies and medications reviewed and updated.  Review of Systems  Constitutional:  Negative for chills and fever.  Eyes:  Negative for visual disturbance.  Respiratory:  Negative for shortness of breath and wheezing.   Cardiovascular:  Negative for chest pain and leg swelling.  Musculoskeletal:  Positive for arthralgias, back pain and gait problem.  Skin:  Negative for rash.  Neurological:  Negative for dizziness, weakness and light-headedness.  Psychiatric/Behavioral:  Positive for dysphoric mood and sleep disturbance. Negative for self-injury and suicidal ideas. The patient is nervous/anxious.   All other systems reviewed and are negative.   Per HPI unless specifically indicated above   Allergies as of 02/13/2022       Reactions   Bee Venom         Medication List        Accurate as of February 13, 2022  2:03 PM. If you have any questions, ask your nurse or doctor.          STOP taking  these medications    losartan 50 MG tablet Commonly known as: COZAAR Stopped by: Worthy Rancher, MD   oxyCODONE-acetaminophen 10-325 MG tablet Commonly known as: PERCOCET Stopped by: Worthy Rancher, MD       TAKE these medications    amiodarone 200 MG tablet Commonly known as: PACERONE Take  200 mg by mouth daily.   apixaban 5 MG Tabs tablet Commonly known as: ELIQUIS Take 5 mg by mouth daily.   aspirin EC 81 MG tablet Take 81 mg by mouth daily. Swallow whole.   atorvastatin 20 MG tablet Commonly known as: LIPITOR TAKE 1 TABLET EVERY DAY   Basaglar KwikPen 100 UNIT/ML Inject 30-40 Units into the skin daily.   Dexcom G7 Sensor Misc by Does not apply route. Use to test blood sugar continuously. Change sensor on arm every 10 days as directed DX: E11.65. Fills at [facility] per medicare contracts   diclofenac Sodium 1 % Gel Commonly known as: VOLTAREN Apply topically 4 (four) times daily.   GNP UltiCare Pen Needles 32G X 6 MM Misc Generic drug: Insulin Pen Needle EVERY DAY   insulin lispro 100 UNIT/ML KwikPen Commonly known as: HumaLOG KwikPen Inject 6-20 Units into the skin 3 (three) times daily with meals.   lidocaine 4 % Place 2 patches onto the skin every 30 (thirty) days.   Melatonin 10 MG Tbcr Take by mouth at bedtime.   metoprolol succinate 100 MG 24 hr tablet Commonly known as: TOPROL-XL Take 100 mg by mouth daily.   nitroGLYCERIN 0.4 MG SL tablet Commonly known as: NITROSTAT Place 0.4 mg under the tongue every 5 (five) minutes as needed for chest pain.   oxyCODONE 5 MG immediate release tablet Commonly known as: Oxy IR/ROXICODONE Take 5 mg by mouth every 6 (six) hours as needed.   polyethylene glycol 17 g packet Commonly known as: MIRALAX / GLYCOLAX Take 1 packet by mouth daily.   senna-docusate 8.6-50 MG tablet Commonly known as: Senokot-S Take 2 tablets by mouth in the morning and at bedtime.   Trulicity 1.5 VB/1.6OM Sopn Generic drug: Dulaglutide Inject 1.5 mg into the skin once a week. DX: E11.65         Objective:   BP (!) 89/61   Pulse 74   Temp (!) 96.8 F (36 C)   Ht $R'6\' 3"'Xw$  (1.905 m)   Wt 251 lb (113.9 kg)   SpO2 98%   BMI 31.37 kg/m   Wt Readings from Last 3 Encounters:  02/13/22 251 lb (113.9 kg)  12/22/21 265 lb  (120.2 kg)  11/13/21 265 lb (120.2 kg)    Physical Exam Vitals and nursing note reviewed.  Constitutional:      General: He is not in acute distress.    Appearance: He is well-developed. He is not diaphoretic.  Eyes:     General: No scleral icterus.    Conjunctiva/sclera: Conjunctivae normal.  Neck:     Thyroid: No thyromegaly.  Cardiovascular:     Rate and Rhythm: Normal rate and regular rhythm.     Heart sounds: Normal heart sounds. No murmur heard. Pulmonary:     Effort: Pulmonary effort is normal. No respiratory distress.     Breath sounds: Normal breath sounds. No wheezing.  Musculoskeletal:     Cervical back: Neck supple.  Lymphadenopathy:     Cervical: No cervical adenopathy.  Skin:    General: Skin is warm and dry.     Findings: No rash.  Neurological:  Mental Status: He is alert and oriented to person, place, and time.     Coordination: Coordination normal.  Psychiatric:        Mood and Affect: Mood is anxious and depressed.        Behavior: Behavior normal.        Thought Content: Thought content does not include suicidal ideation. Thought content does not include suicidal plan.       Assessment & Plan:   Problem List Items Addressed This Visit       Cardiovascular and Mediastinum   Hypertension associated with diabetes ([place])   Relevant Medications   amiodarone (PACERONE) 200 MG tablet   apixaban (ELIQUIS) 5 MG TABS tablet   metoprolol succinate (TOPROL-XL) 100 MG 24 hr tablet   Other Relevant Orders   CBC with Differential/Platelet   Lipid panel   Bayer DCA Hb A1c Waived     Endocrine   Type 2 diabetes mellitus with neurological complications (HCC) - Primary   Relevant Orders   CBC with Differential/Platelet   Lipid panel   Bayer DCA Hb A1c Waived   Hyperlipidemia associated with type 2 diabetes mellitus (HCC)   Relevant Medications   amiodarone (PACERONE) 200 MG tablet   apixaban (ELIQUIS) 5 MG TABS tablet   metoprolol succinate  (TOPROL-XL) 100 MG 24 hr tablet   Other Relevant Orders   CBC with Differential/Platelet   Lipid panel   Bayer DCA Hb A1c Waived     Musculoskeletal and Integument   Pathologic fracture     Other   Multiple myeloma (HCC)   Relevant Medications   oxyCODONE (OXY IR/ROXICODONE) 5 MG immediate release tablet   Low back pain   Relevant Medications   oxyCODONE (OXY IR/ROXICODONE) 5 MG immediate release tablet   Other Visit Diagnoses     Controlled substance agreement signed           Refilled pain medicine, recommended that he not mix with other pain medicine that he had from his surgeries.  He says it has controlled the pain well enough but he just had some constipation with it.  He had new A-fib and is seeing cardiology for this.  He has a lot of anxiety feeling down with this, will try Cymbalta because of also help with pain. Follow up plan: No follow-ups on file.  Counseling provided for all of the vaccine components Orders Placed This Encounter  Procedures   CBC with Differential/Platelet   Lipid panel   Bayer DCA Hb A1c [place], MD [facility] 02/13/2022, 2:03 PM

## 2022-02-13 NOTE — Patient Instructions (Signed)
Hold losartan for the near future and monitor blood pressure.

## 2022-02-14 LAB — CBC WITH DIFFERENTIAL/PLATELET
Basophils Absolute: 0.1 10*3/uL (ref 0.0–0.2)
Basos: 1 %
EOS (ABSOLUTE): 0.4 10*3/uL (ref 0.0–0.4)
Eos: 4 %
Hematocrit: 31.7 % — ABNORMAL LOW (ref 37.5–51.0)
Hemoglobin: 10.3 g/dL — ABNORMAL LOW (ref 13.0–17.7)
Immature Grans (Abs): 0 10*3/uL (ref 0.0–0.1)
Immature Granulocytes: 0 %
Lymphocytes Absolute: 1.9 10*3/uL (ref 0.7–3.1)
Lymphs: 20 %
MCH: 27.4 pg (ref 26.6–33.0)
MCHC: 32.5 g/dL (ref 31.5–35.7)
MCV: 84 fL (ref 79–97)
Monocytes Absolute: 1 10*3/uL — ABNORMAL HIGH (ref 0.1–0.9)
Monocytes: 10 %
Neutrophils Absolute: 6.2 10*3/uL (ref 1.4–7.0)
Neutrophils: 65 %
Platelets: 336 10*3/uL (ref 150–450)
RBC: 3.76 x10E6/uL — ABNORMAL LOW (ref 4.14–5.80)
RDW: 14.5 % (ref 11.6–15.4)
WBC: 9.6 10*3/uL (ref 3.4–10.8)

## 2022-02-14 LAB — LIPID PANEL
Chol/HDL Ratio: 2.1 ratio (ref 0.0–5.0)
Cholesterol, Total: 75 mg/dL — ABNORMAL LOW (ref 100–199)
HDL: 36 mg/dL — ABNORMAL LOW (ref >39)
LDL Chol Calc (NIH): 25 mg/dL (ref 0–99)
Triglycerides: 59 mg/dL (ref 0–149)
VLDL Cholesterol Cal: 14 mg/dL (ref 5–40)

## 2022-02-16 ENCOUNTER — Telehealth: Payer: Self-pay | Admitting: Family Medicine

## 2022-02-16 DIAGNOSIS — I4891 Unspecified atrial fibrillation: Secondary | ICD-10-CM | POA: Diagnosis not present

## 2022-02-16 NOTE — Telephone Encounter (Signed)
LMTCB RE: Gabapentin RF Per OV on 09/08/21 this was [facility]

## 2022-02-16 NOTE — Telephone Encounter (Signed)
  Prescription Request  02/16/2022  Is this a "Controlled Substance" medicine? gabapentin (NEURONTIN) 400 MG capsule   Have you seen your PCP in the last 2 weeks? 02/13/2022  If YES, route message to pool  -  If NO, patient needs to be scheduled for appointment.  What is the name of the medication or equipment? gabapentin (NEURONTIN) 400 MG capsule   Have you contacted your pharmacy to request a refill? no   Which pharmacy would you like this sent to? [facility] [place] on [address]   Patient notified that their request is being sent to the clinical staff for review and that they should receive a response within 2 business days.

## 2022-02-17 NOTE — Telephone Encounter (Signed)
Pt called back in today regarding his gabapentin. Explained the below, that he was taken off of this at his 09/08/21 visit. He did want me to forward the message to his PCP to see if there was anything that he could be prescribed for his neuropathy, he does have an appt on 03/18/22, he was just seen on 02/13/22 Please advise

## 2022-02-18 DIAGNOSIS — I10 Essential (primary) hypertension: Secondary | ICD-10-CM | POA: Diagnosis not present

## 2022-02-18 DIAGNOSIS — E7800 Pure hypercholesterolemia, unspecified: Secondary | ICD-10-CM | POA: Diagnosis not present

## 2022-02-18 DIAGNOSIS — Z515 Encounter for palliative care: Secondary | ICD-10-CM | POA: Diagnosis not present

## 2022-02-18 DIAGNOSIS — N289 Disorder of kidney and ureter, unspecified: Secondary | ICD-10-CM | POA: Diagnosis not present

## 2022-02-18 DIAGNOSIS — M8458XD Pathological fracture in neoplastic disease, other specified site, subsequent encounter for fracture with routine healing: Secondary | ICD-10-CM | POA: Diagnosis not present

## 2022-02-18 DIAGNOSIS — M1990 Unspecified osteoarthritis, unspecified site: Secondary | ICD-10-CM | POA: Diagnosis not present

## 2022-02-18 DIAGNOSIS — C9000 Multiple myeloma not having achieved remission: Secondary | ICD-10-CM | POA: Diagnosis not present

## 2022-02-18 DIAGNOSIS — E1140 Type 2 diabetes mellitus with diabetic neuropathy, unspecified: Secondary | ICD-10-CM | POA: Diagnosis not present

## 2022-02-18 DIAGNOSIS — Z79899 Other long term (current) drug therapy: Secondary | ICD-10-CM | POA: Diagnosis not present

## 2022-02-18 DIAGNOSIS — G893 Neoplasm related pain (acute) (chronic): Secondary | ICD-10-CM | POA: Diagnosis not present

## 2022-02-18 NOTE — Telephone Encounter (Signed)
Called pt , no answer left vm for cb

## 2022-02-18 NOTE — Telephone Encounter (Signed)
I believe he was taken off of it because of possible side effects, we discussed other options at that time but I cannot recall because that was so long ago, we will have to discuss other options in the visit, if he wants to wait till his next visit then that is fine but if he wants to come in sooner and discuss options we can weigh the benefits and risks of each type of medicine.  Also I cannot recall exactly why or what side effect he was having from the gabapentin.  So basically we just need a new visit

## 2022-02-18 NOTE — Telephone Encounter (Signed)
Patient return call. ?

## 2022-02-19 ENCOUNTER — Other Ambulatory Visit: Payer: Self-pay | Admitting: Family Medicine

## 2022-02-19 ENCOUNTER — Telehealth: Payer: Self-pay

## 2022-02-19 DIAGNOSIS — Z981 Arthrodesis status: Secondary | ICD-10-CM | POA: Diagnosis not present

## 2022-02-19 DIAGNOSIS — E1122 Type 2 diabetes mellitus with diabetic chronic kidney disease: Secondary | ICD-10-CM | POA: Diagnosis not present

## 2022-02-19 DIAGNOSIS — Z515 Encounter for palliative care: Secondary | ICD-10-CM | POA: Diagnosis not present

## 2022-02-19 DIAGNOSIS — M8448XD Pathological fracture, other site, subsequent encounter for fracture with routine healing: Secondary | ICD-10-CM | POA: Diagnosis not present

## 2022-02-19 DIAGNOSIS — I129 Hypertensive chronic kidney disease with stage 1 through stage 4 chronic kidney disease, or unspecified chronic kidney disease: Secondary | ICD-10-CM | POA: Diagnosis not present

## 2022-02-19 DIAGNOSIS — I4891 Unspecified atrial fibrillation: Secondary | ICD-10-CM | POA: Diagnosis not present

## 2022-02-19 DIAGNOSIS — C9000 Multiple myeloma not having achieved remission: Secondary | ICD-10-CM | POA: Diagnosis not present

## 2022-02-19 DIAGNOSIS — N189 Chronic kidney disease, unspecified: Secondary | ICD-10-CM | POA: Diagnosis not present

## 2022-02-19 MED ORDER — GABAPENTIN 400 MG PO CAPS: 800.0000 mg | ORAL_CAPSULE | Freq: Two times a day (BID) | ORAL | 1 refills | Status: DC

## 2022-02-19 NOTE — Progress Notes (Signed)
I sent in a refill for gabapentin for the patient

## 2022-02-19 NOTE — Telephone Encounter (Signed)
Called and let wife know refill has been sent

## 2022-02-23 DIAGNOSIS — G893 Neoplasm related pain (acute) (chronic): Secondary | ICD-10-CM | POA: Diagnosis not present

## 2022-02-23 DIAGNOSIS — N289 Disorder of kidney and ureter, unspecified: Secondary | ICD-10-CM | POA: Diagnosis not present

## 2022-02-23 DIAGNOSIS — I10 Essential (primary) hypertension: Secondary | ICD-10-CM | POA: Diagnosis not present

## 2022-02-23 DIAGNOSIS — M1990 Unspecified osteoarthritis, unspecified site: Secondary | ICD-10-CM | POA: Diagnosis not present

## 2022-02-23 DIAGNOSIS — C9000 Multiple myeloma not having achieved remission: Secondary | ICD-10-CM | POA: Diagnosis not present

## 2022-02-23 DIAGNOSIS — Z79899 Other long term (current) drug therapy: Secondary | ICD-10-CM | POA: Diagnosis not present

## 2022-02-23 DIAGNOSIS — M8458XD Pathological fracture in neoplastic disease, other specified site, subsequent encounter for fracture with routine healing: Secondary | ICD-10-CM | POA: Diagnosis not present

## 2022-02-23 DIAGNOSIS — Z515 Encounter for palliative care: Secondary | ICD-10-CM | POA: Diagnosis not present

## 2022-02-23 DIAGNOSIS — E1140 Type 2 diabetes mellitus with diabetic neuropathy, unspecified: Secondary | ICD-10-CM | POA: Diagnosis not present

## 2022-02-23 DIAGNOSIS — E7800 Pure hypercholesterolemia, unspecified: Secondary | ICD-10-CM | POA: Diagnosis not present

## 2022-02-24 DIAGNOSIS — Z515 Encounter for palliative care: Secondary | ICD-10-CM | POA: Diagnosis not present

## 2022-02-24 DIAGNOSIS — E1140 Type 2 diabetes mellitus with diabetic neuropathy, unspecified: Secondary | ICD-10-CM | POA: Diagnosis not present

## 2022-02-24 DIAGNOSIS — C9000 Multiple myeloma not having achieved remission: Secondary | ICD-10-CM | POA: Diagnosis not present

## 2022-02-24 DIAGNOSIS — I10 Essential (primary) hypertension: Secondary | ICD-10-CM | POA: Diagnosis not present

## 2022-02-24 DIAGNOSIS — G893 Neoplasm related pain (acute) (chronic): Secondary | ICD-10-CM | POA: Diagnosis not present

## 2022-02-24 DIAGNOSIS — M8458XD Pathological fracture in neoplastic disease, other specified site, subsequent encounter for fracture with routine healing: Secondary | ICD-10-CM | POA: Diagnosis not present

## 2022-02-24 DIAGNOSIS — N289 Disorder of kidney and ureter, unspecified: Secondary | ICD-10-CM | POA: Diagnosis not present

## 2022-02-24 DIAGNOSIS — Z79899 Other long term (current) drug therapy: Secondary | ICD-10-CM | POA: Diagnosis not present

## 2022-02-24 DIAGNOSIS — M1990 Unspecified osteoarthritis, unspecified site: Secondary | ICD-10-CM | POA: Diagnosis not present

## 2022-02-24 DIAGNOSIS — E7800 Pure hypercholesterolemia, unspecified: Secondary | ICD-10-CM | POA: Diagnosis not present

## 2022-02-24 NOTE — Telephone Encounter (Signed)
Spoke with pts wife. Gabapentin was prescribed at last OV and should not have. Medication has been picked up but wife will not give pt anymore. She is ok to wait until the next OV with Dettinger to discuss.

## 2022-02-26 DIAGNOSIS — Z515 Encounter for palliative care: Secondary | ICD-10-CM | POA: Diagnosis not present

## 2022-02-26 DIAGNOSIS — R1312 Dysphagia, oropharyngeal phase: Secondary | ICD-10-CM | POA: Diagnosis not present

## 2022-02-26 DIAGNOSIS — G629 Polyneuropathy, unspecified: Secondary | ICD-10-CM | POA: Diagnosis not present

## 2022-02-26 DIAGNOSIS — Z510 Encounter for antineoplastic radiation therapy: Secondary | ICD-10-CM | POA: Diagnosis not present

## 2022-02-26 DIAGNOSIS — M6281 Muscle weakness (generalized): Secondary | ICD-10-CM | POA: Diagnosis not present

## 2022-02-26 DIAGNOSIS — C9000 Multiple myeloma not having achieved remission: Secondary | ICD-10-CM | POA: Diagnosis not present

## 2022-02-27 DIAGNOSIS — C9000 Multiple myeloma not having achieved remission: Secondary | ICD-10-CM | POA: Diagnosis not present

## 2022-02-27 DIAGNOSIS — Z515 Encounter for palliative care: Secondary | ICD-10-CM | POA: Diagnosis not present

## 2022-02-27 DIAGNOSIS — Z510 Encounter for antineoplastic radiation therapy: Secondary | ICD-10-CM | POA: Diagnosis not present

## 2022-03-02 DIAGNOSIS — Z510 Encounter for antineoplastic radiation therapy: Secondary | ICD-10-CM | POA: Diagnosis not present

## 2022-03-02 DIAGNOSIS — C9000 Multiple myeloma not having achieved remission: Secondary | ICD-10-CM | POA: Diagnosis not present

## 2022-03-02 DIAGNOSIS — Z515 Encounter for palliative care: Secondary | ICD-10-CM | POA: Diagnosis not present

## 2022-03-03 DIAGNOSIS — G629 Polyneuropathy, unspecified: Secondary | ICD-10-CM | POA: Diagnosis not present

## 2022-03-03 DIAGNOSIS — C9030 Solitary plasmacytoma not having achieved remission: Secondary | ICD-10-CM | POA: Diagnosis not present

## 2022-03-03 DIAGNOSIS — R262 Difficulty in walking, not elsewhere classified: Secondary | ICD-10-CM | POA: Diagnosis not present

## 2022-03-03 DIAGNOSIS — C9000 Multiple myeloma not having achieved remission: Secondary | ICD-10-CM | POA: Diagnosis not present

## 2022-03-03 DIAGNOSIS — R1312 Dysphagia, oropharyngeal phase: Secondary | ICD-10-CM | POA: Diagnosis not present

## 2022-03-03 DIAGNOSIS — M4856XA Collapsed vertebra, not elsewhere classified, lumbar region, initial encounter for fracture: Secondary | ICD-10-CM | POA: Diagnosis not present

## 2022-03-03 DIAGNOSIS — J9610 Chronic respiratory failure, unspecified whether with hypoxia or hypercapnia: Secondary | ICD-10-CM | POA: Diagnosis not present

## 2022-03-03 DIAGNOSIS — M6281 Muscle weakness (generalized): Secondary | ICD-10-CM | POA: Diagnosis not present

## 2022-03-04 DIAGNOSIS — C9000 Multiple myeloma not having achieved remission: Secondary | ICD-10-CM | POA: Diagnosis not present

## 2022-03-04 DIAGNOSIS — Z515 Encounter for palliative care: Secondary | ICD-10-CM | POA: Diagnosis not present

## 2022-03-04 DIAGNOSIS — Z510 Encounter for antineoplastic radiation therapy: Secondary | ICD-10-CM | POA: Diagnosis not present

## 2022-03-05 DIAGNOSIS — Z9889 Other specified postprocedural states: Secondary | ICD-10-CM | POA: Diagnosis not present

## 2022-03-05 DIAGNOSIS — Z510 Encounter for antineoplastic radiation therapy: Secondary | ICD-10-CM | POA: Diagnosis not present

## 2022-03-05 DIAGNOSIS — M8588 Other specified disorders of bone density and structure, other site: Secondary | ICD-10-CM | POA: Diagnosis not present

## 2022-03-05 DIAGNOSIS — C9000 Multiple myeloma not having achieved remission: Secondary | ICD-10-CM | POA: Diagnosis not present

## 2022-03-05 DIAGNOSIS — Z981 Arthrodesis status: Secondary | ICD-10-CM | POA: Diagnosis not present

## 2022-03-05 DIAGNOSIS — Z794 Long term (current) use of insulin: Secondary | ICD-10-CM | POA: Diagnosis not present

## 2022-03-05 DIAGNOSIS — M545 Low back pain, unspecified: Secondary | ICD-10-CM | POA: Diagnosis not present

## 2022-03-05 DIAGNOSIS — G8929 Other chronic pain: Secondary | ICD-10-CM | POA: Diagnosis not present

## 2022-03-05 DIAGNOSIS — E119 Type 2 diabetes mellitus without complications: Secondary | ICD-10-CM | POA: Diagnosis not present

## 2022-03-05 DIAGNOSIS — M8448XA Pathological fracture, other site, initial encounter for fracture: Secondary | ICD-10-CM | POA: Diagnosis not present

## 2022-03-05 DIAGNOSIS — M438X6 Other specified deforming dorsopathies, lumbar region: Secondary | ICD-10-CM | POA: Diagnosis not present

## 2022-03-06 DIAGNOSIS — C9000 Multiple myeloma not having achieved remission: Secondary | ICD-10-CM | POA: Diagnosis not present

## 2022-03-06 DIAGNOSIS — Z515 Encounter for palliative care: Secondary | ICD-10-CM | POA: Diagnosis not present

## 2022-03-06 DIAGNOSIS — Z510 Encounter for antineoplastic radiation therapy: Secondary | ICD-10-CM | POA: Diagnosis not present

## 2022-03-09 DIAGNOSIS — Z510 Encounter for antineoplastic radiation therapy: Secondary | ICD-10-CM | POA: Diagnosis not present

## 2022-03-09 DIAGNOSIS — C9000 Multiple myeloma not having achieved remission: Secondary | ICD-10-CM | POA: Diagnosis not present

## 2022-03-09 DIAGNOSIS — Z515 Encounter for palliative care: Secondary | ICD-10-CM | POA: Diagnosis not present

## 2022-03-09 DIAGNOSIS — E1149 Type 2 diabetes mellitus with other diabetic neurological complication: Secondary | ICD-10-CM | POA: Diagnosis not present

## 2022-03-10 ENCOUNTER — Other Ambulatory Visit: Payer: Self-pay | Admitting: Family Medicine

## 2022-03-10 DIAGNOSIS — Z515 Encounter for palliative care: Secondary | ICD-10-CM | POA: Diagnosis not present

## 2022-03-10 DIAGNOSIS — C9000 Multiple myeloma not having achieved remission: Secondary | ICD-10-CM | POA: Diagnosis not present

## 2022-03-10 DIAGNOSIS — Z510 Encounter for antineoplastic radiation therapy: Secondary | ICD-10-CM | POA: Diagnosis not present

## 2022-03-11 DIAGNOSIS — C9000 Multiple myeloma not having achieved remission: Secondary | ICD-10-CM | POA: Diagnosis not present

## 2022-03-11 DIAGNOSIS — Z510 Encounter for antineoplastic radiation therapy: Secondary | ICD-10-CM | POA: Diagnosis not present

## 2022-03-11 DIAGNOSIS — Z515 Encounter for palliative care: Secondary | ICD-10-CM | POA: Diagnosis not present

## 2022-03-12 ENCOUNTER — Telehealth: Payer: Self-pay | Admitting: Family Medicine

## 2022-03-12 ENCOUNTER — Other Ambulatory Visit: Payer: Self-pay

## 2022-03-12 DIAGNOSIS — Z510 Encounter for antineoplastic radiation therapy: Secondary | ICD-10-CM | POA: Diagnosis not present

## 2022-03-12 DIAGNOSIS — Z515 Encounter for palliative care: Secondary | ICD-10-CM | POA: Diagnosis not present

## 2022-03-12 DIAGNOSIS — C9000 Multiple myeloma not having achieved remission: Secondary | ICD-10-CM | POA: Diagnosis not present

## 2022-03-12 MED ORDER — APIXABAN 5 MG PO TABS: 5.0000 mg | ORAL_TABLET | Freq: Two times a day (BID) | ORAL | 0 refills | Status: DC

## 2022-03-12 MED ORDER — AMIODARONE HCL 200 MG PO TABS: 200.0000 mg | ORAL_TABLET | Freq: Every day | ORAL | 0 refills | Status: DC

## 2022-03-12 MED ORDER — METOPROLOL SUCCINATE ER 100 MG PO TB24: 100.0000 mg | ORAL_TABLET | Freq: Every day | ORAL | 0 refills | Status: DC

## 2022-03-12 NOTE — Telephone Encounter (Signed)
[facility]. Wife made aware.

## 2022-03-12 NOTE — Telephone Encounter (Signed)
Pt has an appt with Dettinger on 11/22  Sees cardio next Tuesday  New radiation treatments- 10 more then chemo  Ok to send in 30 days of the medications?

## 2022-03-12 NOTE — Telephone Encounter (Signed)
How much of the medicine does he have left, he needs to be seen but I definitely do not want to run out.  See if we can get him in ASAP and ask how much of the medicine he has.

## 2022-03-12 NOTE — Telephone Encounter (Signed)
Yes okay to send 30 days medication, make sure we get the doses appropriately

## 2022-03-16 DIAGNOSIS — C9000 Multiple myeloma not having achieved remission: Secondary | ICD-10-CM | POA: Diagnosis not present

## 2022-03-16 DIAGNOSIS — Z510 Encounter for antineoplastic radiation therapy: Secondary | ICD-10-CM | POA: Diagnosis not present

## 2022-03-16 DIAGNOSIS — N189 Chronic kidney disease, unspecified: Secondary | ICD-10-CM | POA: Diagnosis not present

## 2022-03-16 DIAGNOSIS — I129 Hypertensive chronic kidney disease with stage 1 through stage 4 chronic kidney disease, or unspecified chronic kidney disease: Secondary | ICD-10-CM | POA: Diagnosis not present

## 2022-03-16 DIAGNOSIS — Z981 Arthrodesis status: Secondary | ICD-10-CM | POA: Diagnosis not present

## 2022-03-16 DIAGNOSIS — E1122 Type 2 diabetes mellitus with diabetic chronic kidney disease: Secondary | ICD-10-CM | POA: Diagnosis not present

## 2022-03-16 DIAGNOSIS — I4891 Unspecified atrial fibrillation: Secondary | ICD-10-CM | POA: Diagnosis not present

## 2022-03-16 DIAGNOSIS — Z515 Encounter for palliative care: Secondary | ICD-10-CM | POA: Diagnosis not present

## 2022-03-16 DIAGNOSIS — M8448XD Pathological fracture, other site, subsequent encounter for fracture with routine healing: Secondary | ICD-10-CM | POA: Diagnosis not present

## 2022-03-17 DIAGNOSIS — I4819 Other persistent atrial fibrillation: Secondary | ICD-10-CM | POA: Diagnosis not present

## 2022-03-17 DIAGNOSIS — I4891 Unspecified atrial fibrillation: Secondary | ICD-10-CM | POA: Diagnosis not present

## 2022-03-17 DIAGNOSIS — Z515 Encounter for palliative care: Secondary | ICD-10-CM | POA: Diagnosis not present

## 2022-03-17 DIAGNOSIS — C9000 Multiple myeloma not having achieved remission: Secondary | ICD-10-CM | POA: Diagnosis not present

## 2022-03-17 DIAGNOSIS — Z510 Encounter for antineoplastic radiation therapy: Secondary | ICD-10-CM | POA: Diagnosis not present

## 2022-03-18 ENCOUNTER — Ambulatory Visit (INDEPENDENT_AMBULATORY_CARE_PROVIDER_SITE_OTHER): Payer: Medicare PPO | Admitting: Family Medicine

## 2022-03-18 ENCOUNTER — Encounter: Payer: Self-pay | Admitting: Family Medicine

## 2022-03-18 VITALS — Ht 75.0 in

## 2022-03-18 DIAGNOSIS — Z510 Encounter for antineoplastic radiation therapy: Secondary | ICD-10-CM | POA: Diagnosis not present

## 2022-03-18 DIAGNOSIS — D649 Anemia, unspecified: Secondary | ICD-10-CM | POA: Diagnosis not present

## 2022-03-18 DIAGNOSIS — F419 Anxiety disorder, unspecified: Secondary | ICD-10-CM

## 2022-03-18 DIAGNOSIS — E1149 Type 2 diabetes mellitus with other diabetic neurological complication: Secondary | ICD-10-CM | POA: Diagnosis not present

## 2022-03-18 DIAGNOSIS — F32A Depression, unspecified: Secondary | ICD-10-CM

## 2022-03-18 DIAGNOSIS — C9000 Multiple myeloma not having achieved remission: Secondary | ICD-10-CM | POA: Diagnosis not present

## 2022-03-18 DIAGNOSIS — Z515 Encounter for palliative care: Secondary | ICD-10-CM | POA: Diagnosis not present

## 2022-03-18 MED ORDER — DULOXETINE HCL 30 MG PO CPEP: 30.0000 mg | ORAL_CAPSULE | Freq: Every day | ORAL | 1 refills | Status: DC

## 2022-03-18 MED ORDER — TRULICITY 1.5 MG/0.5ML ~~LOC~~ SOAJ: 1.5000 mg | SUBCUTANEOUS | 2 refills | Status: DC

## 2022-03-18 NOTE — Progress Notes (Signed)
Ht '6\' 3"'$  (1.905 m)   BMI 31.37 kg/m    Subjective:   Patient ID: Ethan Price., male    DOB: [birth year], 66 y.o.   MRN: [id number]  HPI: Ethan Hornback. is a 66 y.o. male presenting on 03/18/2022 for Medical Management of Chronic Issues, Depression, and Anxiety (Feels better on Cymbalta)   HPI Anxiety and depression recheck Patient is coming back for anxiety and depression recheck.  He did start Cymbalta.  He feels like things are going really well with the mood.  It does not seem to help much with the pain but did help significantly with mood.    03/18/2022    1:05 PM 02/13/2022    1:10 PM 12/22/2021    2:55 PM 11/13/2021    1:41 PM 11/13/2021    1:40 PM  Depression screen PHQ 2/9  Decreased Interest 0 0 0  0  Down, Depressed, Hopeless 0 0 0  0  PHQ - 2 Score 0 0 0  0  Altered sleeping 0 1  0   Tired, decreased energy 0 2  1   Change in appetite 0 1  0   Feeling bad or failure about yourself  0 0  0   Trouble concentrating 0 0  0   Moving slowly or fidgety/restless 0 1  0   Suicidal thoughts 0 0  0   PHQ-9 Score 0 5     Difficult doing work/chores Not difficult at all Somewhat difficult  Not difficult at all     Patient gets Trulicity from mail order and is short on it.  Relevant past medical, surgical, family and social history reviewed and updated as indicated. Interim medical history since our last visit reviewed. Allergies and medications reviewed and updated.  Review of Systems  Constitutional:  Negative for chills and fever.  Eyes:  Negative for visual disturbance.  Respiratory:  Negative for shortness of breath and wheezing.   Cardiovascular:  Negative for chest pain and leg swelling.  Musculoskeletal:  Positive for back pain. Negative for gait problem.  Skin:  Negative for rash.  Neurological:  Negative for dizziness, weakness and light-headedness.  All other systems reviewed and are negative.   Per HPI unless specifically indicated  above   Allergies as of 03/18/2022       Reactions   Bee Venom         Medication List        Accurate as of March 18, 2022  1:29 PM. If you have any questions, ask your nurse or doctor.          amiodarone 200 MG tablet Commonly known as: PACERONE Take 1 tablet (200 mg total) by mouth daily.   apixaban 5 MG Tabs tablet Commonly known as: ELIQUIS Take 1 tablet (5 mg total) by mouth 2 (two) times daily.   aspirin EC 81 MG tablet Take 81 mg by mouth daily. Swallow whole.   atorvastatin 20 MG tablet Commonly known as: LIPITOR TAKE 1 TABLET EVERY DAY   Basaglar KwikPen 100 UNIT/ML Inject 30-40 Units into the skin daily.   Dexcom G7 Sensor Misc by Does not apply route. Use to test blood sugar continuously. Change sensor on arm every 10 days as directed DX: E11.65. Fills at [facility] per medicare contracts   diclofenac Sodium 1 % Gel Commonly known as: VOLTAREN Apply topically 4 (four) times daily.   DULoxetine 30 MG capsule Commonly known as: CYMBALTA Take 1 capsule (  30 mg total) by mouth daily. What changed: how much to take Changed by: Fransisca Kaufmann Banner Huckaba, MD   gabapentin 400 MG capsule Commonly known as: Neurontin Take 2 capsules (800 mg total) by mouth 2 (two) times daily.   GNP UltiCare Pen Needles 32G X 6 MM Misc Generic drug: Insulin Pen Needle EVERY DAY   insulin lispro 100 UNIT/ML KwikPen Commonly known as: HumaLOG KwikPen Inject 6-20 Units into the skin 3 (three) times daily with meals.   Melatonin 10 MG Tbcr Take by mouth at bedtime.   metoprolol succinate 100 MG 24 hr tablet Commonly known as: TOPROL-XL Take 1 tablet (100 mg total) by mouth daily. Take with or immediately following a meal.   nitroGLYCERIN 0.4 MG SL tablet Commonly known as: NITROSTAT Place 0.4 mg under the tongue every 5 (five) minutes as needed for chest pain.   oxyCODONE 5 MG immediate release tablet Commonly known as: Oxy IR/ROXICODONE Take 5 mg by mouth every 6  (six) hours as needed.   oxyCODONE-acetaminophen 10-325 MG tablet Commonly known as: PERCOCET Take 1 tablet by mouth 2 (two) times daily as needed for pain.   polyethylene glycol 17 g packet Commonly known as: MIRALAX / GLYCOLAX Take 1 packet by mouth daily.   senna-docusate 8.6-50 MG tablet Commonly known as: Senokot-S Take 2 tablets by mouth in the morning and at bedtime.   Trulicity 1.5 DQ/2.2WL Sopn Generic drug: Dulaglutide Inject 1.5 mg into the skin once a week. DX: E11.65         Objective:   Ht '6\' 3"'$  (1.905 m)   BMI 31.37 kg/m   Wt Readings from Last 3 Encounters:  02/13/22 251 lb (113.9 kg)  12/22/21 265 lb (120.2 kg)  11/13/21 265 lb (120.2 kg)    Physical Exam Vitals and nursing note reviewed.  Constitutional:      General: He is not in acute distress.    Appearance: He is well-developed. He is not diaphoretic.  Eyes:     General: No scleral icterus.    Conjunctiva/sclera: Conjunctivae normal.  Neck:     Thyroid: No thyromegaly.  Cardiovascular:     Rate and Rhythm: Normal rate and regular rhythm.     Heart sounds: Normal heart sounds. No murmur heard. Pulmonary:     Effort: Pulmonary effort is normal. No respiratory distress.     Breath sounds: Normal breath sounds. No wheezing.  Musculoskeletal:        General: Normal range of motion.     Cervical back: Neck supple.  Lymphadenopathy:     Cervical: No cervical adenopathy.  Skin:    General: Skin is warm and dry.     Findings: No rash.  Neurological:     Mental Status: He is alert and oriented to person, place, and time.     Coordination: Coordination normal.  Psychiatric:        Mood and Affect: Mood is anxious and depressed.        Behavior: Behavior normal.        Thought Content: Thought content does not include suicidal ideation. Thought content does not include suicidal plan.       Assessment & Plan:   Problem List Items Addressed This Visit       Endocrine   Type 2 diabetes  mellitus with neurological complications ([place])   Relevant Medications   Dulaglutide (TRULICITY) 1.5 NL/8.9QJ SOPN   Other Visit Diagnoses     Anxiety and depression    -  Primary   Relevant Medications   DULoxetine (CYMBALTA) 30 MG capsule   Anemia, unspecified type           Doing a lot better with mood positivity, will continue Cymbalta.  Will try again sample for Ozempic because he is running well on the Trulicity. Follow up plan: Return in about 2 months (around 05/18/2022), or if symptoms worsen or fail to improve, for Diabetes recheck.  Counseling provided for all of the vaccine components No orders of the defined types were placed in this encounter.   Caryl Pina, MD [facility] 03/18/2022, 1:29 PM

## 2022-03-18 NOTE — Addendum Note (Signed)
Addended by: Alphonzo [place] on: 03/18/2022 01:38 PM   Modules accepted: Orders

## 2022-03-23 DIAGNOSIS — Z515 Encounter for palliative care: Secondary | ICD-10-CM | POA: Diagnosis not present

## 2022-03-23 DIAGNOSIS — Z510 Encounter for antineoplastic radiation therapy: Secondary | ICD-10-CM | POA: Diagnosis not present

## 2022-03-23 DIAGNOSIS — C9000 Multiple myeloma not having achieved remission: Secondary | ICD-10-CM | POA: Diagnosis not present

## 2022-03-24 DIAGNOSIS — Z510 Encounter for antineoplastic radiation therapy: Secondary | ICD-10-CM | POA: Diagnosis not present

## 2022-03-24 DIAGNOSIS — C9000 Multiple myeloma not having achieved remission: Secondary | ICD-10-CM | POA: Diagnosis not present

## 2022-03-24 DIAGNOSIS — Z515 Encounter for palliative care: Secondary | ICD-10-CM | POA: Diagnosis not present

## 2022-03-25 DIAGNOSIS — C9000 Multiple myeloma not having achieved remission: Secondary | ICD-10-CM | POA: Diagnosis not present

## 2022-03-25 DIAGNOSIS — Z515 Encounter for palliative care: Secondary | ICD-10-CM | POA: Diagnosis not present

## 2022-03-25 DIAGNOSIS — Z510 Encounter for antineoplastic radiation therapy: Secondary | ICD-10-CM | POA: Diagnosis not present

## 2022-03-26 DIAGNOSIS — Z510 Encounter for antineoplastic radiation therapy: Secondary | ICD-10-CM | POA: Diagnosis not present

## 2022-03-26 DIAGNOSIS — C9000 Multiple myeloma not having achieved remission: Secondary | ICD-10-CM | POA: Diagnosis not present

## 2022-03-26 DIAGNOSIS — Z515 Encounter for palliative care: Secondary | ICD-10-CM | POA: Diagnosis not present

## 2022-03-27 ENCOUNTER — Other Ambulatory Visit: Payer: Self-pay | Admitting: *Deleted

## 2022-03-27 DIAGNOSIS — Z515 Encounter for palliative care: Secondary | ICD-10-CM | POA: Diagnosis not present

## 2022-03-27 DIAGNOSIS — C9000 Multiple myeloma not having achieved remission: Secondary | ICD-10-CM | POA: Diagnosis not present

## 2022-03-27 DIAGNOSIS — K209 Esophagitis, unspecified without bleeding: Secondary | ICD-10-CM | POA: Diagnosis not present

## 2022-03-27 DIAGNOSIS — Z923 Personal history of irradiation: Secondary | ICD-10-CM | POA: Diagnosis not present

## 2022-03-27 DIAGNOSIS — R1310 Dysphagia, unspecified: Secondary | ICD-10-CM | POA: Diagnosis not present

## 2022-03-27 MED ORDER — DULOXETINE HCL 30 MG PO CPEP: 30.0000 mg | ORAL_CAPSULE | Freq: Every day | ORAL | 0 refills | Status: DC

## 2022-03-27 MED ORDER — APIXABAN 5 MG PO TABS: 5.0000 mg | ORAL_TABLET | Freq: Two times a day (BID) | ORAL | 0 refills | Status: DC

## 2022-03-27 MED ORDER — METOPROLOL SUCCINATE ER 100 MG PO TB24: 100.0000 mg | ORAL_TABLET | Freq: Every day | ORAL | 0 refills | Status: DC

## 2022-03-27 MED ORDER — AMIODARONE HCL 200 MG PO TABS: 200.0000 mg | ORAL_TABLET | Freq: Every day | ORAL | 0 refills | Status: DC

## 2022-03-28 DIAGNOSIS — M6281 Muscle weakness (generalized): Secondary | ICD-10-CM | POA: Diagnosis not present

## 2022-03-28 DIAGNOSIS — R1312 Dysphagia, oropharyngeal phase: Secondary | ICD-10-CM | POA: Diagnosis not present

## 2022-03-28 DIAGNOSIS — G629 Polyneuropathy, unspecified: Secondary | ICD-10-CM | POA: Diagnosis not present

## 2022-03-30 DIAGNOSIS — C9000 Multiple myeloma not having achieved remission: Secondary | ICD-10-CM | POA: Diagnosis not present

## 2022-03-30 DIAGNOSIS — Z515 Encounter for palliative care: Secondary | ICD-10-CM | POA: Diagnosis not present

## 2022-03-30 DIAGNOSIS — K209 Esophagitis, unspecified without bleeding: Secondary | ICD-10-CM | POA: Diagnosis not present

## 2022-03-30 DIAGNOSIS — R1310 Dysphagia, unspecified: Secondary | ICD-10-CM | POA: Diagnosis not present

## 2022-03-30 DIAGNOSIS — Z923 Personal history of irradiation: Secondary | ICD-10-CM | POA: Diagnosis not present

## 2022-03-31 DIAGNOSIS — Z515 Encounter for palliative care: Secondary | ICD-10-CM | POA: Diagnosis not present

## 2022-03-31 DIAGNOSIS — R1310 Dysphagia, unspecified: Secondary | ICD-10-CM | POA: Diagnosis not present

## 2022-03-31 DIAGNOSIS — Z923 Personal history of irradiation: Secondary | ICD-10-CM | POA: Diagnosis not present

## 2022-03-31 DIAGNOSIS — K209 Esophagitis, unspecified without bleeding: Secondary | ICD-10-CM | POA: Diagnosis not present

## 2022-03-31 DIAGNOSIS — C9000 Multiple myeloma not having achieved remission: Secondary | ICD-10-CM | POA: Diagnosis not present

## 2022-04-02 DIAGNOSIS — R1312 Dysphagia, oropharyngeal phase: Secondary | ICD-10-CM | POA: Diagnosis not present

## 2022-04-02 DIAGNOSIS — G629 Polyneuropathy, unspecified: Secondary | ICD-10-CM | POA: Diagnosis not present

## 2022-04-02 DIAGNOSIS — R262 Difficulty in walking, not elsewhere classified: Secondary | ICD-10-CM | POA: Diagnosis not present

## 2022-04-02 DIAGNOSIS — M6281 Muscle weakness (generalized): Secondary | ICD-10-CM | POA: Diagnosis not present

## 2022-04-02 DIAGNOSIS — J9610 Chronic respiratory failure, unspecified whether with hypoxia or hypercapnia: Secondary | ICD-10-CM | POA: Diagnosis not present

## 2022-04-03 ENCOUNTER — Telehealth: Payer: Self-pay | Admitting: Family Medicine

## 2022-04-03 DIAGNOSIS — E1149 Type 2 diabetes mellitus with other diabetic neurological complication: Secondary | ICD-10-CM

## 2022-04-07 MED ORDER — TRULICITY 1.5 MG/0.5ML ~~LOC~~ SOAJ: 1.5000 mg | SUBCUTANEOUS | 2 refills | Status: DC

## 2022-04-07 MED ORDER — BASAGLAR KWIKPEN 100 UNIT/ML ~~LOC~~ SOPN: 30.0000 [IU] | PEN_INJECTOR | Freq: Every day | SUBCUTANEOUS | 11 refills | Status: DC

## 2022-04-07 MED ORDER — INSULIN LISPRO (1 UNIT DIAL) 100 UNIT/ML (KWIKPEN): 6.0000 [IU] | PEN_INJECTOR | Freq: Three times a day (TID) | SUBCUTANEOUS | 11 refills | Status: DC

## 2022-04-07 NOTE — Telephone Encounter (Signed)
Can we make sure patient is enrolled with [facility]  Has not received shipment in awhile per wife Sent in new Rxs for basaglar/humalog to [facility] today I'm guessing they didn't receive re-enrollment paperwork (please mail if needed)  Thank you!

## 2022-04-09 NOTE — Telephone Encounter (Signed)
Spoke with pt's wife regarding refill process and re-enrollment.   Informed her I will be mailing the application to their home with instructions on how to return it.

## 2022-04-15 DIAGNOSIS — R0609 Other forms of dyspnea: Secondary | ICD-10-CM | POA: Diagnosis not present

## 2022-04-15 DIAGNOSIS — C9000 Multiple myeloma not having achieved remission: Secondary | ICD-10-CM | POA: Diagnosis not present

## 2022-04-28 DIAGNOSIS — M6281 Muscle weakness (generalized): Secondary | ICD-10-CM | POA: Diagnosis not present

## 2022-04-28 DIAGNOSIS — R1312 Dysphagia, oropharyngeal phase: Secondary | ICD-10-CM | POA: Diagnosis not present

## 2022-04-28 DIAGNOSIS — G629 Polyneuropathy, unspecified: Secondary | ICD-10-CM | POA: Diagnosis not present

## 2022-05-03 DIAGNOSIS — M6281 Muscle weakness (generalized): Secondary | ICD-10-CM | POA: Diagnosis not present

## 2022-05-03 DIAGNOSIS — G629 Polyneuropathy, unspecified: Secondary | ICD-10-CM | POA: Diagnosis not present

## 2022-05-03 DIAGNOSIS — R1312 Dysphagia, oropharyngeal phase: Secondary | ICD-10-CM | POA: Diagnosis not present

## 2022-05-03 DIAGNOSIS — J9610 Chronic respiratory failure, unspecified whether with hypoxia or hypercapnia: Secondary | ICD-10-CM | POA: Diagnosis not present

## 2022-05-03 DIAGNOSIS — R262 Difficulty in walking, not elsewhere classified: Secondary | ICD-10-CM | POA: Diagnosis not present

## 2022-05-07 DIAGNOSIS — C9030 Solitary plasmacytoma not having achieved remission: Secondary | ICD-10-CM | POA: Diagnosis not present

## 2022-05-11 ENCOUNTER — Telehealth (INDEPENDENT_AMBULATORY_CARE_PROVIDER_SITE_OTHER): Payer: Medicare PPO | Admitting: Nurse Practitioner

## 2022-05-11 ENCOUNTER — Encounter: Payer: Self-pay | Admitting: Nurse Practitioner

## 2022-05-11 DIAGNOSIS — L03031 Cellulitis of right toe: Secondary | ICD-10-CM

## 2022-05-11 MED ORDER — SULFAMETHOXAZOLE-TRIMETHOPRIM 800-160 MG PO TABS: 1.0000 | ORAL_TABLET | Freq: Two times a day (BID) | ORAL | 0 refills | Status: DC

## 2022-05-11 NOTE — Patient Instructions (Signed)
Cellulitis, Adult  Cellulitis is a skin infection. The infected area is often warm, red, swollen, and sore. It occurs most often in the arms and lower legs. It is very important to get treated for this condition. What are the causes? This condition is caused by bacteria. The bacteria enter through a break in the skin, such as a cut, burn, insect bite, open sore, or crack. What increases the risk? This condition is more likely to occur in people who: Have a weak body defense system (immune system). Have open cuts, burns, bites, or scrapes on the skin. Are older than 68 years of age. Have a blood sugar problem (diabetes). Have a long-lasting (chronic) liver disease (cirrhosis) or kidney disease. Are very overweight (obese). Have a skin problem, such as: Itchy rash (eczema). Slow movement of blood in the veins (venous stasis). Fluid buildup below the skin (edema). Have been treated with high-energy rays (radiation). Use IV drugs. What are the signs or symptoms? Symptoms of this condition include: Skin that is: Red. Streaking. Spotting. Swollen. Sore or painful when you touch it. Warm. A fever. Chills. Blisters. How is this diagnosed? This condition is diagnosed based on: Medical history. Physical exam. Blood tests. Imaging tests. How is this treated? Treatment for this condition may include: Medicines to treat infections or allergies. Home care, such as: Rest. Placing cold or warm cloths (compresses) on the skin. Hospital care, if the condition is very bad. Follow these instructions at home: Medicines Take over-the-counter and prescription medicines only as told by your doctor. If you were prescribed an antibiotic medicine, take it as told by your doctor. Do not stop taking it even if you start to feel better. General instructions  Drink enough fluid to keep your pee (urine) pale yellow. Do not touch or rub the infected area. Raise (elevate) the infected area above  the level of your heart while you are sitting or lying down. Place cold or warm cloths on the area as told by your doctor. Keep all follow-up visits as told by your doctor. This is important. Contact a doctor if: You have a fever. You do not start to get better after 1-2 days of treatment. Your bone or joint under the infected area starts to hurt after the skin has healed. Your infection comes back. This can happen in the same area or another area. You have a swollen bump in the area. You have new symptoms. You feel ill and have muscle aches and pains. Get help right away if: Your symptoms get worse. You feel very sleepy. You throw up (vomit) or have watery poop (diarrhea) for a long time. You see red streaks coming from the area. Your red area gets larger. Your red area turns dark in color. These symptoms may represent a serious problem that is an emergency. Do not wait to see if the symptoms will go away. Get medical help right away. Call your local emergency services (911 in the [place] Do not drive yourself to the hospital. Summary Cellulitis is a skin infection. The area is often warm, red, swollen, and sore. This condition is treated with medicines, rest, and cold and warm cloths. Take all medicines only as told by your doctor. Tell your doctor if symptoms do not start to get better after 1-2 days of treatment. This information is not intended to replace advice given to you by your health care provider. Make sure you discuss any questions you have with your health care provider. Document Revised: 01/22/2021 Document   Reviewed: 01/23/2021 Elsevier Patient Education  2023 [employer]  
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
## 2022-05-18 ENCOUNTER — Telehealth: Payer: Self-pay

## 2022-05-18 ENCOUNTER — Ambulatory Visit (INDEPENDENT_AMBULATORY_CARE_PROVIDER_SITE_OTHER): Payer: Medicare PPO

## 2022-05-18 DIAGNOSIS — Z0000 Encounter for general adult medical examination without abnormal findings: Secondary | ICD-10-CM | POA: Diagnosis not present

## 2022-05-18 NOTE — Progress Notes (Signed)
Subjective:   Ethan Mchan. is a 67 y.o. male who presents for Medicare Annual/Subsequent preventive examination.  I connected with  [name]. on 05/18/22 by a audio enabled telemedicine application and verified that I am speaking with the correct person using two identifiers.  Patient Location: Home  Provider Location: Home Office  I discussed the limitations of evaluation and management by telemedicine. The patient expressed understanding and agreed to proceed.  Review of Systems     Cardiac Risk Factors include: advanced age (>21mn, >>71women);male gender;hypertension;dyslipidemia;diabetes mellitus;sedentary lifestyle     Objective:    Today's Vitals   There is no height or weight on file to calculate BMI.     05/18/2022    3:44 PM 06/26/2021    4:46 PM 05/15/2021    3:50 PM 03/27/2021    9:42 AM 05/09/2020    3:44 PM  Advanced Directives  Does Patient Have a Medical Advance Directive? Yes Yes No Yes Yes  Type [place]      Does patient want to make changes to medical advance directive?  Yes (ED - Information included in AVS)  No - Guardian declined   Copy of [place] Chart? No - copy requested      Would patient like information on creating a medical advance directive?   No - Patient declined No - Patient declined     Current Medications (verified) Outpatient Encounter Medications as of 05/18/2022  Medication Sig   amiodarone (PACERONE) 200 MG tablet Take 1 tablet (200 mg total) by mouth daily.   apixaban (ELIQUIS) 5 MG TABS tablet Take 1 tablet (5 mg total) by mouth 2 (two) times daily.   aspirin EC 81 MG tablet Take 81 mg by mouth daily. Swallow whole.   atorvastatin (LIPITOR) 20 MG tablet TAKE 1 TABLET EVERY DAY   Continuous Blood Gluc Sensor (DEXCOM G7 SENSOR) MISC by Does not apply route. Use to test blood sugar continuously. Change sensor on arm every 10 days as directed DX:  E11.65. Fills at [facility] per medicare contracts   diclofenac Sodium (VOLTAREN) 1 % GEL Apply topically 4 (four) times daily.   Dulaglutide (TRULICITY) 1.5 MQM/0.8QPSOPN Inject 1.5 mg into the skin once a week. DX: E11.65   DULoxetine (CYMBALTA) 30 MG capsule Take 1 capsule (30 mg total) by mouth daily.   gabapentin (NEURONTIN) 400 MG capsule Take 2 capsules (800 mg total) by mouth 2 (two) times daily.   GNP ULTICARE PEN NEEDLES 32G X 6 MM MISC EVERY DAY   Insulin Glargine (BASAGLAR KWIKPEN) 100 UNIT/ML Inject 30-40 Units into the skin daily.   insulin lispro (HUMALOG KWIKPEN) 100 UNIT/ML KwikPen Inject 6-20 Units into the skin 3 (three) times daily with meals.   Melatonin 10 MG TBCR Take by mouth at bedtime.   metoprolol succinate (TOPROL-XL) 100 MG 24 hr tablet Take 1 tablet (100 mg total) by mouth daily. Take with or immediately following a meal.   nitroGLYCERIN (NITROSTAT) 0.4 MG SL tablet Place 0.4 mg under the tongue every 5 (five) minutes as needed for chest pain.   oxyCODONE (OXY IR/ROXICODONE) 5 MG immediate release tablet Take 5 mg by mouth every 6 (six) hours as needed.   oxyCODONE-acetaminophen (PERCOCET) 10-325 MG tablet Take 1 tablet by mouth 2 (two) times daily as needed for pain.   polyethylene glycol (MIRALAX / GLYCOLAX) 17 g packet Take 1 packet by mouth daily.  senna-docusate (SENOKOT-S) 8.6-50 MG tablet Take 2 tablets by mouth in the morning and at bedtime.   sulfamethoxazole-trimethoprim (BACTRIM DS) 800-160 MG tablet Take 1 tablet by mouth 2 (two) times daily.   No facility-administered encounter medications on file as of 05/18/2022.    Allergies (verified) Bee venom   History: Past Medical History:  Diagnosis Date   Cataract    Diabetes mellitus without complication ([place])    Neuropathy    Past Surgical History:  Procedure Laterality Date   ANKLE SURGERY Right    BACK SURGERY     EYE SURGERY     cateracts   GASTROPLASTY  1978   IR FLUORO GUIDED NEEDLE PLC  ASPIRATION/INJECTION LOC  03/27/2021   TOTAL HIP ARTHROPLASTY Bilateral    Family History  Problem Relation Age of Onset   Diabetes Mother    Heart disease Mother    Uterine cancer Mother    Heart disease Father    Diabetes Father    Social History   Socioeconomic History   Marital status: Married    Spouse name: Anderson Malta   Number of children: Not on file   Years of education: Not on file   Highest education level: Not on file  Occupational History   Occupation: retired  Tobacco Use   Smoking status: Never   Smokeless tobacco: Never  Substance and Sexual Activity   Alcohol use: Never   Drug use: Never   Sexual activity: Yes    Comment: married for 2 years, widowed twice, 10 children  Other Topics Concern   Not on file  Social History Narrative   Children out of town   Lots of family nearby   Social Determinants of Health   Financial Resource Strain: [place]  (01/28/2022)   Overall Financial Resource Strain (CARDIA)    Difficulty of Paying Living Expenses: Not hard at all  Food Insecurity: No Food Insecurity (05/15/2021)   Hunger Vital Sign    Worried About Running Out of Food in the Last Year: Never true    [place] in the Last Year: Never true  Transportation Needs: No Transportation Needs (01/28/2022)   PRAPARE - Hydrologist (Medical): No    Lack of Transportation (Non-Medical): No  Physical Activity: Inactive (05/15/2021)   Exercise Vital Sign    Days of Exercise per Week: 0 days    Minutes of Exercise per Session: 0 min  Stress: No Stress Concern Present (05/15/2021)   [place]    Feeling of Stress : Only a little  Social Connections: Socially Integrated (05/15/2021)   Social Connection and Isolation Panel [NHANES]    Frequency of Communication with Friends and Family: More than three times a week    Frequency of Social Gatherings with Friends and Family:  Twice a week    Attends Religious Services: More than 4 times per year    Active Member of [employer] or Organizations: Yes    Attends Music therapist: More than 4 times per year    Marital Status: Married    Tobacco Counseling Counseling given: Not Answered   Clinical Intake:  Pre-visit preparation completed: Yes  Pain : No/denies pain     Diabetes: No  How often do you need to have someone help you when you read instructions, pamphlets, or other written materials from your doctor or pharmacy?: 1 - Never  Diabetic?Yes Nutrition Risk Assessment:  Has the  patient had any N/V/D within the last 2 months?  No  Does the patient have any non-healing wounds?  No  Has the patient had any unintentional weight loss or weight gain?  No   Diabetes:  Is the patient diabetic?  Yes  If diabetic, was a CBG obtained today?  No  Did the patient bring in their glucometer from home?  No  How often do you monitor your CBG's? Dexcom.   Financial Strains and Diabetes Management:  Are you having any financial strains with the device, your supplies or your medication? No .  Does the patient want to be seen by Chronic Care Management for management of their diabetes?  No  Would the patient like to be referred to a Nutritionist or for Diabetic Management?  No   Diabetic Exams:  Diabetic Eye Exam: Completed 02/13/22 Diabetic Foot Exam: Completed 05/09/21   Interpreter Needed?: No  Information entered by :: Denman George LPN   Activities of Daily Living    05/18/2022    3:45 PM  In your present state of health, do you have any difficulty performing the following activities:  Hearing? 0  Vision? 0  Difficulty concentrating or making decisions? 0  Walking or climbing stairs? 0  Dressing or bathing? 0  Doing errands, shopping? 0  Preparing Food and eating ? N  Using the Toilet? N  In the past six months, have you accidently leaked urine? N  Do you have problems with loss  of bowel control? N  Managing your Medications? N  Managing your Finances? N  Housekeeping or managing your Housekeeping? N    Patient Care Team: Dettinger, Fransisca Kaufmann, MD as PCP - General (Family Medicine) Lavera Guise, [facility] (Pharmacist) Diannia Ruder, MD as Referring Physician (Hematology and Oncology) Harrell Lark, PhD as Referring Physician (Cardiology)  Indicate any recent Medical Services you may have received from other than [facility] providers in the past year (date may be approximate).     Assessment:   This is a routine wellness examination for [place].  Hearing/Vision screen Hearing Screening - Comments:: Denies hearing difficulties   Vision Screening - Comments:: up to date with routine eye exams    Dietary issues and exercise activities discussed: Current Exercise Habits: The patient does not participate in regular exercise at present   Goals Addressed               This Visit's Progress     Patient Stated   Not on track     I want relief from this back pain and be able to be active again.      COMPLETED: T2DM, CHRONIC CARE MANAGEMENT PHARMD GOALS (pt-stated)        Current Barriers:  Unable to independently afford treatment regimen Unable to maintain control of T2DM Suboptimal therapeutic regimen for T2DM  Pharmacist Clinical Goal(s):  patient will verbalize ability to afford treatment regimen maintain control of T2DM as evidenced by NORMOGLYCEMIA  adhere to plan to optimize therapeutic regimen for T2DM as evidenced by report of adherence to recommended medication management changes through collaboration with PharmD and provider.   Interventions: 1:1 collaboration with Dettinger, Fransisca Kaufmann, MD regarding development and update of comprehensive plan of care as evidenced by provider attestation and co-signature Inter-disciplinary care team collaboration (see longitudinal plan of care) Comprehensive medication review performed; medication list updated in  electronic medical record  Diabetes: New goal. Uncontrolled/--last a1c was 5.9% in 04/2021, but now patient has been in the  hospital, then SNF where his medications were all changed.  He has been without his PCP prescribed insulin regimen and has not had trulicity in a few months.  His wife states his blood sugars have vastly increased compared to how well controlled he was prior to entering the hospital, etc. Restarting T2DM meds as follows: Basaglar 34-40 units daily Humalog per sliding scale Trulicity 1.'5mg'$  sq weekly For [employer] patient assistance program, please escribe 4 month supplies to :  Denies personal and family history of Medullary thyroid cancer (MTC) Uses new Dexcom G7 CGM Gets CGM via [facility] via parachute Can be escribed in Epic  Change sensor every 10 days; Now worn on the arm Samples left up front for patient due to malfunction Parachute order screen  Current exercise: unable due to current cancer workup; can't walk Educated on restarting medications, dexcom Recommended trulicity, insulin regimen-->will follow and adjust as needed Assessed patient finances. Application to be submitted to [employer] patient assistance program for [employer], [employer] and Trulicity (pending)  Reviewed HLD meds (LDL is 27), kidney function is stable and blood pressure is controlled  Will continue to follow chronic conditions  Patient Goals/Self-Care Activities patient will:  - take medications as prescribed as evidenced by patient report and record review check glucose CONTINUOUSLY USING DEXCOM G7 CGM, document, and provide at future appointments collaborate with provider on medication access solutions target a minimum of 150 minutes of moderate intensity exercise weekly engage in dietary modifications by FOLLOWING A HEART HEALTHY DIET/HEALTHY PLATE METHOD .       Depression Screen    03/18/2022    1:05 PM 02/13/2022    1:10 PM 12/22/2021    2:55 PM 11/13/2021     1:40 PM 09/08/2021    4:28 PM 05/15/2021    3:45 PM 05/09/2021    1:47 PM  PHQ 2/9 Scores  PHQ - 2 Score 0 0 0 0 0 0 0  PHQ- 9 Score 0 5         Fall Risk    05/18/2022    3:39 PM 03/18/2022    1:05 PM 02/13/2022    1:10 PM 12/22/2021    2:55 PM 11/13/2021    1:40 PM  [place] in the past year? 0 '1 1 1 1  '$ Number falls in past yr: 0 '1 1 1 1  '$ Injury with Fall? 0 '1 1 1 1  '$ Risk for fall due to : Impaired balance/gait;Impaired mobility History of fall(s);Impaired mobility;Orthopedic patient History of fall(s) History of fall(s) Impaired balance/gait;Impaired mobility;Mental status change  Follow up Falls prevention discussed;Education provided;Falls evaluation completed Falls evaluation completed Falls evaluation completed Falls evaluation completed Falls evaluation completed    FALL RISK PREVENTION PERTAINING TO THE HOME:  Any stairs in or around the home? No  If so, are there any without handrails? No  Home free of loose throw rugs in walkways, pet beds, electrical cords, etc? Yes  Adequate lighting in your home to reduce risk of falls? Yes   ASSISTIVE DEVICES UTILIZED TO PREVENT FALLS:  Life alert? No  Use of a cane, walker or w/c? Yes  Grab bars in the bathroom? Yes  Shower chair or bench in shower? Yes  Elevated toilet seat or a handicapped toilet? Yes   TIMED UP AND GO:  Was the test performed? No . Telephonic visit   Cognitive Function:        05/18/2022    3:45 PM 05/15/2021  3:36 PM  6CIT Screen  What Year? 0 points 0 points  What month? 0 points 0 points  What time? 0 points 0 points  Count back from 20 0 points 0 points  Months in reverse 0 points 2 points  Repeat phrase 2 points 2 points  Total Score 2 points 4 points    Immunizations Immunization History  Administered Date(s) Administered   Fluad Quad(high Dose 65+) 03/13/2021   PNEUMOCOCCAL CONJUGATE-20 05/09/2021   Pneumococcal Conjugate-13 02/01/2020   Tdap 02/01/2020   Zoster  Recombinat (Shingrix) 11/06/2020, 05/09/2021    TDAP status: Up to date  Flu Vaccine status: Declined, Education has been provided regarding the importance of this vaccine but patient still declined. Advised may receive this vaccine at local pharmacy or Health Dept. Aware to provide a copy of the vaccination record if obtained from local pharmacy or Health Dept. Verbalized acceptance and understanding.  Pneumococcal vaccine status: Up to date  Covid-19 vaccine status: Information provided on how to obtain vaccines.   Qualifies for Shingles Vaccine? Yes   Zostavax completed No   Shingrix Completed?: Yes  Screening Tests Health Maintenance  Topic Date Due   COVID-19 Vaccine (1) Never done   Diabetic kidney evaluation - Urine ACR  08/18/2019   Diabetic kidney evaluation - eGFR measurement  04/04/2022   FOOT EXAM  05/09/2022   INFLUENZA VACCINE  07/26/2022 (Originally 11/25/2021)   HEMOGLOBIN A1C  08/15/2022   OPHTHALMOLOGY EXAM  02/14/2023   Medicare Annual Wellness (AWV)  05/19/2023   COLONOSCOPY (Pts 45-67yr Insurance coverage will need to be confirmed)  04/29/2026   DTaP/Tdap/Td (2 - Td or Tdap) 01/31/2030   Pneumonia Vaccine 67 Years old  Completed   Zoster Vaccines- Shingrix  Completed   HPV VACCINES  Aged Out   Hepatitis C Screening  Discontinued    Health Maintenance  Health Maintenance Due  Topic Date Due   COVID-19 Vaccine (1) Never done   Diabetic kidney evaluation - Urine ACR  08/18/2019   Diabetic kidney evaluation - eGFR measurement  04/04/2022   FOOT EXAM  05/09/2022    Colorectal cancer Screening:  Cologuard ordered today  Lung Cancer Screening: (Low Dose CT Chest recommended if Age 68-80years, 30 pack-year currently smoking OR have quit w/in 15years.) does not qualify.   Lung Cancer Screening Referral: n/a   Additional Screening:  Hepatitis C Screening: does not qualify  Vision Screening: Recommended annual ophthalmology exams for early detection of  glaucoma and other disorders of the eye. Is the patient up to date with their annual eye exam?  Yes  Who is the provider or what is the name of the office in which the patient attends annual eye exams? Unable to provide name  If pt is not established with a provider, would they like to be referred to a provider to establish care? No .   Dental Screening: Recommended annual dental exams for proper oral hygiene  Community Resource Referral / Chronic Care Management: CRR required this visit?  No   CCM required this visit?  No      Plan:     I have personally reviewed and noted the following in the patient's chart:   Medical and social history Use of alcohol, tobacco or illicit drugs  Current medications and supplements including opioid prescriptions. Patient is currently taking opioid prescriptions. Information provided to patient regarding non-opioid alternatives. Patient advised to discuss non-opioid treatment plan with their provider. Functional ability and status Nutritional status Physical activity  Advanced directives List of other physicians Hospitalizations, surgeries, and ER visits in previous 12 months Vitals Screenings to include cognitive, depression, and falls Referrals and appointments  In addition, I have reviewed and discussed with patient certain preventive protocols, quality metrics, and best practice recommendations. A written personalized care plan for preventive services as well as general preventive health recommendations were provided to patient.     Vanetta Mulders, [place]   [id number]   Due to this being a virtual visit, the after visit summary with patients personalized plan was offered to patient via mail or my-chart.  Patient would like to access on my-chart  Nurse Notes: Patient is requesting a refill of Percocet.  See telephone message.

## 2022-05-18 NOTE — Patient Instructions (Signed)
[name] , Thank you for taking time to come for your Medicare Wellness Visit. I appreciate your ongoing commitment to your health goals. Please review the following plan we discussed and let me know if I can assist you in the future.   These are the goals we discussed:  Goals      Patient Stated     I want relief from this back pain and be able to be active again.        This is a list of the screening recommended for you and due dates:  Health Maintenance  Topic Date Due   COVID-19 Vaccine (1) Never done   Yearly kidney health urinalysis for diabetes  08/18/2019   Yearly kidney function blood test for diabetes  04/04/2022   Complete foot exam   05/09/2022   Flu Shot  07/26/2022*   Hemoglobin A1C  08/15/2022   Eye exam for diabetics  02/14/2023   Medicare Annual Wellness Visit  05/19/2023   Colon Cancer Screening  04/29/2026   DTaP/Tdap/Td vaccine (2 - Td or Tdap) 01/31/2030   Pneumonia Vaccine  Completed   Zoster (Shingles) Vaccine  Completed   HPV Vaccine  Aged Out   Hepatitis C Screening: USPSTF Recommendation to screen - Ages 68-79 yo.  Discontinued  *Topic was postponed. The date shown is not the original due date.    Advanced directives: Please bring a copy of your health care power of attorney and living will to the office to be added to your chart at your convenience.   Conditions/risks identified: Aim for 30 minutes of exercise or brisk walking, 6-8 glasses of water, and 5 servings of fruits and vegetables each day.   Next appointment: Follow up in one year for your annual wellness visit.   Preventive Care 68 Years and Older, Male  Preventive care refers to lifestyle choices and visits with your health care provider that can promote health and wellness. What does preventive care include? A yearly physical exam. This is also called an annual well check. Dental exams once or twice a year. Routine eye exams. Ask your health care provider how often you should have  your eyes checked. Personal lifestyle choices, including: Daily care of your teeth and gums. Regular physical activity. Eating a healthy diet. Avoiding tobacco and drug use. Limiting alcohol use. Practicing safe sex. Taking low doses of aspirin every day. Taking vitamin and mineral supplements as recommended by your health care provider. What happens during an annual well check? The services and screenings done by your health care provider during your annual well check will depend on your age, overall health, lifestyle risk factors, and family history of disease. Counseling  Your health care provider may ask you questions about your: Alcohol use. Tobacco use. Drug use. Emotional well-being. Home and relationship well-being. Sexual activity. Eating habits. History of falls. Memory and ability to understand (cognition). Work and work Statistician. Screening  You may have the following tests or measurements: Height, weight, and BMI. Blood pressure. Lipid and cholesterol levels. These may be checked every 5 years, or more frequently if you are over 12 years old. Skin check. Lung cancer screening. You may have this screening every year starting at age 68 if you have a 30-pack-year history of smoking and currently smoke or have quit within the past 15 years. Fecal occult blood test (FOBT) of the stool. You may have this test every year starting at age 68. Flexible sigmoidoscopy or colonoscopy. You may have a  sigmoidoscopy every 5 years or a colonoscopy every 10 years starting at age 65. Prostate cancer screening. Recommendations will vary depending on your family history and other risks. Hepatitis C blood test. Hepatitis B blood test. Sexually transmitted disease (STD) testing. Diabetes screening. This is done by checking your blood sugar (glucose) after you have not eaten for a while (fasting). You may have this done every 1-3 years. Abdominal aortic aneurysm (AAA) screening. You may  need this if you are a current or former smoker. Osteoporosis. You may be screened starting at age 68 if you are at high risk. Talk with your health care provider about your test results, treatment options, and if necessary, the need for more tests. Vaccines  Your health care provider may recommend certain vaccines, such as: Influenza vaccine. This is recommended every year. Tetanus, diphtheria, and acellular pertussis (Tdap, Td) vaccine. You may need a Td booster every 10 years. Zoster vaccine. You may need this after age 68. Pneumococcal 13-valent conjugate (PCV13) vaccine. One dose is recommended after age 68. Pneumococcal polysaccharide (PPSV23) vaccine. One dose is recommended after age 68. Talk to your health care provider about which screenings and vaccines you need and how often you need them. This information is not intended to replace advice given to you by your health care provider. Make sure you discuss any questions you have with your health care provider. Document Released: 05/10/2015 Document Revised: 01/01/2016 Document Reviewed: 02/12/2015 Elsevier Interactive Patient Education  2017 [place] Prevention in the Home Falls can cause injuries. They can happen to people of all ages. There are many things you can do to make your home safe and to help prevent falls. What can I do on the outside of my home? Regularly fix the edges of walkways and driveways and fix any cracks. Remove anything that might make you trip as you walk through a door, such as a raised step or threshold. Trim any bushes or trees on the path to your home. Use bright outdoor lighting. Clear any walking paths of anything that might make someone trip, such as rocks or tools. Regularly check to see if handrails are loose or broken. Make sure that both sides of any steps have handrails. Any raised decks and porches should have guardrails on the edges. Have any leaves, snow, or ice cleared  regularly. Use sand or salt on walking paths during winter. Clean up any spills in your garage right away. This includes oil or grease spills. What can I do in the bathroom? Use night lights. Install grab bars by the toilet and in the tub and shower. Do not use towel bars as grab bars. Use non-skid mats or decals in the tub or shower. If you need to sit down in the shower, use a plastic, non-slip stool. Keep the floor dry. Clean up any water that spills on the floor as soon as it happens. Remove soap buildup in the tub or shower regularly. Attach bath mats securely with double-sided non-slip rug tape. Do not have throw rugs and other things on the floor that can make you trip. What can I do in the bedroom? Use night lights. Make sure that you have a light by your bed that is easy to reach. Do not use any sheets or blankets that are too big for your bed. They should not hang down onto the floor. Have a firm chair that has side arms. You can use this for support while you get dressed. Do not  have throw rugs and other things on the floor that can make you trip. What can I do in the kitchen? Clean up any spills right away. Avoid walking on wet floors. Keep items that you use a lot in easy-to-reach places. If you need to reach something above you, use a strong step stool that has a grab bar. Keep electrical cords out of the way. Do not use floor polish or wax that makes floors slippery. If you must use wax, use non-skid floor wax. Do not have throw rugs and other things on the floor that can make you trip. What can I do with my stairs? Do not leave any items on the stairs. Make sure that there are handrails on both sides of the stairs and use them. Fix handrails that are broken or loose. Make sure that handrails are as long as the stairways. Check any carpeting to make sure that it is firmly attached to the stairs. Fix any carpet that is loose or worn. Avoid having throw rugs at the top or  bottom of the stairs. If you do have throw rugs, attach them to the floor with carpet tape. Make sure that you have a light switch at the top of the stairs and the bottom of the stairs. If you do not have them, ask someone to add them for you. What else can I do to help prevent falls? Wear shoes that: Do not have high heels. Have rubber bottoms. Are comfortable and fit you well. Are closed at the toe. Do not wear sandals. If you use a stepladder: Make sure that it is fully opened. Do not climb a closed stepladder. Make sure that both sides of the stepladder are locked into place. Ask someone to hold it for you, if possible. Clearly mark and make sure that you can see: Any grab bars or handrails. First and last steps. Where the edge of each step is. Use tools that help you move around (mobility aids) if they are needed. These include: Canes. Walkers. Scooters. Crutches. Turn on the lights when you go into a dark area. Replace any light bulbs as soon as they burn out. Set up your furniture so you have a clear path. Avoid moving your furniture around. If any of your floors are uneven, fix them. If there are any pets around you, be aware of where they are. Review your medicines with your doctor. Some medicines can make you feel dizzy. This can increase your chance of falling. Ask your doctor what other things that you can do to help prevent falls. This information is not intended to replace advice given to you by your health care provider. Make sure you discuss any questions you have with your health care provider. Document Released: 02/07/2009 Document Revised: 09/19/2015 Document Reviewed: 05/18/2014 Elsevier Interactive Patient Education  2017 [employer].

## 2022-05-18 NOTE — Telephone Encounter (Signed)
Cannot fill outside of the visit, needs to make an appointment

## 2022-05-18 NOTE — Telephone Encounter (Signed)
Wife made aware and will call back to schedule.  States that pt thought since he signed the form that he could get refills whenever needed. Informed that he has to be seen every 34mand [place]er would prescribe enough medication to make it until each visit.

## 2022-05-18 NOTE — Telephone Encounter (Signed)
Patient seen for AWV and is asking for a refill of Percocet.  States that he only has enough to last until tomorrow. States that he is on a pain agreement.  Please advise.

## 2022-05-25 DIAGNOSIS — C9000 Multiple myeloma not having achieved remission: Secondary | ICD-10-CM | POA: Diagnosis not present

## 2022-05-25 DIAGNOSIS — Z91030 Bee allergy status: Secondary | ICD-10-CM | POA: Diagnosis not present

## 2022-05-25 DIAGNOSIS — C9030 Solitary plasmacytoma not having achieved remission: Secondary | ICD-10-CM | POA: Diagnosis not present

## 2022-05-25 DIAGNOSIS — Z5111 Encounter for antineoplastic chemotherapy: Secondary | ICD-10-CM | POA: Diagnosis not present

## 2022-05-25 DIAGNOSIS — Z8049 Family history of malignant neoplasm of other genital organs: Secondary | ICD-10-CM | POA: Diagnosis not present

## 2022-05-25 DIAGNOSIS — I129 Hypertensive chronic kidney disease with stage 1 through stage 4 chronic kidney disease, or unspecified chronic kidney disease: Secondary | ICD-10-CM | POA: Diagnosis not present

## 2022-05-25 DIAGNOSIS — M1990 Unspecified osteoarthritis, unspecified site: Secondary | ICD-10-CM | POA: Diagnosis not present

## 2022-05-25 DIAGNOSIS — Z794 Long term (current) use of insulin: Secondary | ICD-10-CM | POA: Diagnosis not present

## 2022-05-25 DIAGNOSIS — E1122 Type 2 diabetes mellitus with diabetic chronic kidney disease: Secondary | ICD-10-CM | POA: Diagnosis not present

## 2022-05-25 DIAGNOSIS — N189 Chronic kidney disease, unspecified: Secondary | ICD-10-CM | POA: Diagnosis not present

## 2022-05-29 DIAGNOSIS — R1312 Dysphagia, oropharyngeal phase: Secondary | ICD-10-CM | POA: Diagnosis not present

## 2022-05-29 DIAGNOSIS — G629 Polyneuropathy, unspecified: Secondary | ICD-10-CM | POA: Diagnosis not present

## 2022-05-29 DIAGNOSIS — M6281 Muscle weakness (generalized): Secondary | ICD-10-CM | POA: Diagnosis not present

## 2022-06-01 DIAGNOSIS — C9000 Multiple myeloma not having achieved remission: Secondary | ICD-10-CM | POA: Diagnosis not present

## 2022-06-01 DIAGNOSIS — N189 Chronic kidney disease, unspecified: Secondary | ICD-10-CM | POA: Diagnosis not present

## 2022-06-01 DIAGNOSIS — C9030 Solitary plasmacytoma not having achieved remission: Secondary | ICD-10-CM | POA: Diagnosis not present

## 2022-06-01 DIAGNOSIS — E1122 Type 2 diabetes mellitus with diabetic chronic kidney disease: Secondary | ICD-10-CM | POA: Diagnosis not present

## 2022-06-01 DIAGNOSIS — M1990 Unspecified osteoarthritis, unspecified site: Secondary | ICD-10-CM | POA: Diagnosis not present

## 2022-06-01 DIAGNOSIS — Z5111 Encounter for antineoplastic chemotherapy: Secondary | ICD-10-CM | POA: Diagnosis not present

## 2022-06-01 DIAGNOSIS — Z8049 Family history of malignant neoplasm of other genital organs: Secondary | ICD-10-CM | POA: Diagnosis not present

## 2022-06-01 DIAGNOSIS — Z794 Long term (current) use of insulin: Secondary | ICD-10-CM | POA: Diagnosis not present

## 2022-06-01 DIAGNOSIS — Z91030 Bee allergy status: Secondary | ICD-10-CM | POA: Diagnosis not present

## 2022-06-01 DIAGNOSIS — I129 Hypertensive chronic kidney disease with stage 1 through stage 4 chronic kidney disease, or unspecified chronic kidney disease: Secondary | ICD-10-CM | POA: Diagnosis not present

## 2022-06-03 DIAGNOSIS — J9610 Chronic respiratory failure, unspecified whether with hypoxia or hypercapnia: Secondary | ICD-10-CM | POA: Diagnosis not present

## 2022-06-03 DIAGNOSIS — R262 Difficulty in walking, not elsewhere classified: Secondary | ICD-10-CM | POA: Diagnosis not present

## 2022-06-03 DIAGNOSIS — G629 Polyneuropathy, unspecified: Secondary | ICD-10-CM | POA: Diagnosis not present

## 2022-06-03 DIAGNOSIS — R1312 Dysphagia, oropharyngeal phase: Secondary | ICD-10-CM | POA: Diagnosis not present

## 2022-06-03 DIAGNOSIS — M6281 Muscle weakness (generalized): Secondary | ICD-10-CM | POA: Diagnosis not present

## 2022-06-07 DIAGNOSIS — E1149 Type 2 diabetes mellitus with other diabetic neurological complication: Secondary | ICD-10-CM | POA: Diagnosis not present

## 2022-06-08 DIAGNOSIS — N189 Chronic kidney disease, unspecified: Secondary | ICD-10-CM | POA: Diagnosis not present

## 2022-06-08 DIAGNOSIS — Z5111 Encounter for antineoplastic chemotherapy: Secondary | ICD-10-CM | POA: Diagnosis not present

## 2022-06-08 DIAGNOSIS — Z8049 Family history of malignant neoplasm of other genital organs: Secondary | ICD-10-CM | POA: Diagnosis not present

## 2022-06-08 DIAGNOSIS — Z794 Long term (current) use of insulin: Secondary | ICD-10-CM | POA: Diagnosis not present

## 2022-06-08 DIAGNOSIS — M1990 Unspecified osteoarthritis, unspecified site: Secondary | ICD-10-CM | POA: Diagnosis not present

## 2022-06-08 DIAGNOSIS — C9030 Solitary plasmacytoma not having achieved remission: Secondary | ICD-10-CM | POA: Diagnosis not present

## 2022-06-08 DIAGNOSIS — E1122 Type 2 diabetes mellitus with diabetic chronic kidney disease: Secondary | ICD-10-CM | POA: Diagnosis not present

## 2022-06-08 DIAGNOSIS — C9000 Multiple myeloma not having achieved remission: Secondary | ICD-10-CM | POA: Diagnosis not present

## 2022-06-08 DIAGNOSIS — I129 Hypertensive chronic kidney disease with stage 1 through stage 4 chronic kidney disease, or unspecified chronic kidney disease: Secondary | ICD-10-CM | POA: Diagnosis not present

## 2022-06-11 ENCOUNTER — Other Ambulatory Visit: Payer: Self-pay | Admitting: Family Medicine

## 2022-06-15 DIAGNOSIS — I129 Hypertensive chronic kidney disease with stage 1 through stage 4 chronic kidney disease, or unspecified chronic kidney disease: Secondary | ICD-10-CM | POA: Diagnosis not present

## 2022-06-15 DIAGNOSIS — N189 Chronic kidney disease, unspecified: Secondary | ICD-10-CM | POA: Diagnosis not present

## 2022-06-15 DIAGNOSIS — M1990 Unspecified osteoarthritis, unspecified site: Secondary | ICD-10-CM | POA: Diagnosis not present

## 2022-06-15 DIAGNOSIS — C9030 Solitary plasmacytoma not having achieved remission: Secondary | ICD-10-CM | POA: Diagnosis not present

## 2022-06-15 DIAGNOSIS — Z8049 Family history of malignant neoplasm of other genital organs: Secondary | ICD-10-CM | POA: Diagnosis not present

## 2022-06-15 DIAGNOSIS — R140 Abdominal distension (gaseous): Secondary | ICD-10-CM | POA: Diagnosis not present

## 2022-06-15 DIAGNOSIS — C9000 Multiple myeloma not having achieved remission: Secondary | ICD-10-CM | POA: Diagnosis not present

## 2022-06-15 DIAGNOSIS — Z794 Long term (current) use of insulin: Secondary | ICD-10-CM | POA: Diagnosis not present

## 2022-06-15 DIAGNOSIS — E1122 Type 2 diabetes mellitus with diabetic chronic kidney disease: Secondary | ICD-10-CM | POA: Diagnosis not present

## 2022-06-22 DIAGNOSIS — Z794 Long term (current) use of insulin: Secondary | ICD-10-CM | POA: Diagnosis not present

## 2022-06-22 DIAGNOSIS — C9030 Solitary plasmacytoma not having achieved remission: Secondary | ICD-10-CM | POA: Diagnosis not present

## 2022-06-22 DIAGNOSIS — M1990 Unspecified osteoarthritis, unspecified site: Secondary | ICD-10-CM | POA: Diagnosis not present

## 2022-06-22 DIAGNOSIS — C9000 Multiple myeloma not having achieved remission: Secondary | ICD-10-CM | POA: Diagnosis not present

## 2022-06-22 DIAGNOSIS — N189 Chronic kidney disease, unspecified: Secondary | ICD-10-CM | POA: Diagnosis not present

## 2022-06-22 DIAGNOSIS — Z8049 Family history of malignant neoplasm of other genital organs: Secondary | ICD-10-CM | POA: Diagnosis not present

## 2022-06-22 DIAGNOSIS — E1122 Type 2 diabetes mellitus with diabetic chronic kidney disease: Secondary | ICD-10-CM | POA: Diagnosis not present

## 2022-06-22 DIAGNOSIS — I129 Hypertensive chronic kidney disease with stage 1 through stage 4 chronic kidney disease, or unspecified chronic kidney disease: Secondary | ICD-10-CM | POA: Diagnosis not present

## 2022-06-22 DIAGNOSIS — Z5111 Encounter for antineoplastic chemotherapy: Secondary | ICD-10-CM | POA: Diagnosis not present

## 2022-06-29 DIAGNOSIS — Z794 Long term (current) use of insulin: Secondary | ICD-10-CM | POA: Diagnosis not present

## 2022-06-29 DIAGNOSIS — Z8049 Family history of malignant neoplasm of other genital organs: Secondary | ICD-10-CM | POA: Diagnosis not present

## 2022-06-29 DIAGNOSIS — C9030 Solitary plasmacytoma not having achieved remission: Secondary | ICD-10-CM | POA: Diagnosis not present

## 2022-06-29 DIAGNOSIS — N189 Chronic kidney disease, unspecified: Secondary | ICD-10-CM | POA: Diagnosis not present

## 2022-06-29 DIAGNOSIS — I129 Hypertensive chronic kidney disease with stage 1 through stage 4 chronic kidney disease, or unspecified chronic kidney disease: Secondary | ICD-10-CM | POA: Diagnosis not present

## 2022-06-29 DIAGNOSIS — C9000 Multiple myeloma not having achieved remission: Secondary | ICD-10-CM | POA: Diagnosis not present

## 2022-06-29 DIAGNOSIS — M1990 Unspecified osteoarthritis, unspecified site: Secondary | ICD-10-CM | POA: Diagnosis not present

## 2022-06-29 DIAGNOSIS — Z5111 Encounter for antineoplastic chemotherapy: Secondary | ICD-10-CM | POA: Diagnosis not present

## 2022-06-29 DIAGNOSIS — E1122 Type 2 diabetes mellitus with diabetic chronic kidney disease: Secondary | ICD-10-CM | POA: Diagnosis not present

## 2022-07-01 ENCOUNTER — Telehealth (INDEPENDENT_AMBULATORY_CARE_PROVIDER_SITE_OTHER): Payer: Medicare PPO | Admitting: Family Medicine

## 2022-07-01 ENCOUNTER — Encounter: Payer: Self-pay | Admitting: Family Medicine

## 2022-07-01 DIAGNOSIS — M5442 Lumbago with sciatica, left side: Secondary | ICD-10-CM | POA: Diagnosis not present

## 2022-07-01 DIAGNOSIS — M5441 Lumbago with sciatica, right side: Secondary | ICD-10-CM

## 2022-07-01 DIAGNOSIS — Z79899 Other long term (current) drug therapy: Secondary | ICD-10-CM | POA: Diagnosis not present

## 2022-07-01 DIAGNOSIS — M8448XA Pathological fracture, other site, initial encounter for fracture: Secondary | ICD-10-CM | POA: Diagnosis not present

## 2022-07-01 MED ORDER — OXYCODONE-ACETAMINOPHEN 10-325 MG PO TABS: 1.0000 | ORAL_TABLET | Freq: Two times a day (BID) | ORAL | 0 refills | Status: DC | PRN

## 2022-07-01 NOTE — Progress Notes (Signed)
Virtual Visit via mychart video Note  I connected with [name]. on 07/01/22 at 1431 by video and verified that I am speaking with the correct person using two identifiers. [name]. is currently located at home and  wife  are currently with her during visit. The provider, Fransisca Kaufmann Nita Whitmire, MD is located in their office at time of visit.  Call ended at 1447  I discussed the limitations, risks, security and privacy concerns of performing an evaluation and management service by video and the availability of in person appointments. I also discussed with the patient that there may be a patient responsible charge related to this service. The patient expressed understanding and agreed to proceed.   History and Present Illness: Patient is still doing chemotherapy and had something for nausea. Patient is still pain in his back and ribs from tumor sites.  He is doing chemo and it seems to be helping.  His WBC is too low and is on revelemet. He is improving on cancer spots. He is going to have bone marrow transplant later this year.  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 Pain location- back and right ribs Pain on scale of 1-10- 5 Frequency- most day What increases pain-movement What makes pain Better-oxycodone and rest Effects on ADL - in the bed most of the time Any change in general medical condition-fighting cancer and heading in the right direction  Current opioids rx- oxycodone 10-'325mg'$  # meds rx- 60/month Effectiveness of current meds-works Adverse reactions from pain meds-constipation Morphine equivalent- 30  Pill count performed-No Last drug screen - 12/26/21 ( high risk q60m moderate risk q648mlow risk yearly ) Urine drug screen today- No Was the NCDuncaneviewed- yes  If yes were their any concerning findings? - got some from cancer DR.   Pain contract signed on: 12/26/21   Outpatient Encounter Medications as of 07/01/2022  Medication Sig    amiodarone (PACERONE) 200 MG tablet Take 1 tablet (200 mg total) by mouth daily. Needs office visit for further refills   apixaban (ELIQUIS) 5 MG TABS tablet Take 1 tablet (5 mg total) by mouth 2 (two) times daily. Needs office visit for further refills   aspirin EC 81 MG tablet Take 81 mg by mouth daily. Swallow whole.   atorvastatin (LIPITOR) 20 MG tablet TAKE 1 TABLET EVERY DAY   Continuous Blood Gluc Sensor (DEXCOM G7 SENSOR) MISC by Does not apply route. Use to test blood sugar continuously. Change sensor on arm every 10 days as directed DX: E11.65. Fills at [facility] per medicare contracts   diclofenac Sodium (VOLTAREN) 1 % GEL Apply topically 4 (four) times daily.   Dulaglutide (TRULICITY) 1.5 MG0000000OPN Inject 1.5 mg into the skin once a week. DX: E11.65   DULoxetine (CYMBALTA) 30 MG capsule Take 1 capsule (30 mg total) by mouth daily. Needs office visit for further refills   gabapentin (NEURONTIN) 400 MG capsule Take 2 capsules (800 mg total) by mouth 2 (two) times daily.   GNP ULTICARE PEN NEEDLES 32G X 6 MM MISC EVERY DAY   Insulin Glargine (BASAGLAR KWIKPEN) 100 UNIT/ML Inject 30-40 Units into the skin daily.   insulin lispro (HUMALOG KWIKPEN) 100 UNIT/ML KwikPen Inject 6-20 Units into the skin 3 (three) times daily with meals.   Melatonin 10 MG TBCR Take by mouth at bedtime.   metoprolol succinate (TOPROL-XL) 100 MG 24 hr tablet Take 1 tablet (100 mg total) by mouth daily.  Needs office visit for further refills   nitroGLYCERIN (NITROSTAT) 0.4 MG SL tablet Place 0.4 mg under the tongue every 5 (five) minutes as needed for chest pain.   oxyCODONE-acetaminophen (PERCOCET) 10-325 MG tablet Take 1 tablet by mouth 2 (two) times daily as needed for pain.   [START ON 07/31/2022] oxyCODONE-acetaminophen (PERCOCET) 10-325 MG tablet Take 1 tablet by mouth 2 (two) times daily as needed for pain.   polyethylene glycol (MIRALAX / GLYCOLAX) 17 g packet Take 1 packet by mouth daily.   senna-docusate  (SENOKOT-S) 8.6-50 MG tablet Take 2 tablets by mouth in the morning and at bedtime.   sulfamethoxazole-trimethoprim (BACTRIM DS) 800-160 MG tablet Take 1 tablet by mouth 2 (two) times daily.   [DISCONTINUED] oxyCODONE (OXY IR/ROXICODONE) 5 MG immediate release tablet Take 5 mg by mouth every 6 (six) hours as needed.   [DISCONTINUED] oxyCODONE-acetaminophen (PERCOCET) 10-325 MG tablet Take 1 tablet by mouth 2 (two) times daily as needed for pain.   No facility-administered encounter medications on file as of 07/01/2022.    Review of Systems  Constitutional:  Negative for chills and fever.  Eyes:  Negative for visual disturbance.  Respiratory:  Negative for shortness of breath and wheezing.   Cardiovascular:  Negative for chest pain and leg swelling.  Musculoskeletal:  Positive for arthralgias, back pain and myalgias. Negative for gait problem.  Skin:  Negative for color change and rash.  All other systems reviewed and are negative.   Observations/Objective: Patient sounds comfortable and in no acute distress  Assessment and Plan: Problem List Items Addressed This Visit       Musculoskeletal and Integument   Pathologic fracture   Relevant Medications   oxyCODONE-acetaminophen (PERCOCET) 10-325 MG tablet   oxyCODONE-acetaminophen (PERCOCET) 10-325 MG tablet (Start on 07/31/2022)     Other   Low back pain   Relevant Medications   oxyCODONE-acetaminophen (PERCOCET) 10-325 MG tablet   oxyCODONE-acetaminophen (PERCOCET) 10-325 MG tablet (Start on 07/31/2022)   Other Visit Diagnoses     Controlled substance agreement signed       Relevant Medications   oxyCODONE-acetaminophen (PERCOCET) 10-325 MG tablet   oxyCODONE-acetaminophen (PERCOCET) 10-325 MG tablet (Start on 07/31/2022)       Refilled medications and will follow up Follow up plan: Return in about 2 months (around 08/31/2022), or if symptoms worsen or fail to improve, for cancer and back pain.     I discussed the assessment  and treatment plan with the patient. The patient was provided an opportunity to ask questions and all were answered. The patient agreed with the plan and demonstrated an understanding of the instructions.   The patient was advised to call back or seek an in-person evaluation if the symptoms worsen or if the condition fails to improve as anticipated.  The above assessment and management plan was discussed with the patient. The patient verbalized understanding of and has agreed to the management plan. Patient is aware to call the clinic if symptoms persist or worsen. Patient is aware when to return to the clinic for a follow-up visit. Patient educated on when it is appropriate to go to the emergency department.    I provided 16 minutes of non-face-to-face time during this encounter.    Worthy Rancher, MD

## 2022-07-02 ENCOUNTER — Other Ambulatory Visit: Payer: Self-pay | Admitting: Family Medicine

## 2022-07-02 DIAGNOSIS — R1312 Dysphagia, oropharyngeal phase: Secondary | ICD-10-CM | POA: Diagnosis not present

## 2022-07-02 DIAGNOSIS — R262 Difficulty in walking, not elsewhere classified: Secondary | ICD-10-CM | POA: Diagnosis not present

## 2022-07-02 DIAGNOSIS — G629 Polyneuropathy, unspecified: Secondary | ICD-10-CM | POA: Diagnosis not present

## 2022-07-02 DIAGNOSIS — M6281 Muscle weakness (generalized): Secondary | ICD-10-CM | POA: Diagnosis not present

## 2022-07-02 DIAGNOSIS — J9610 Chronic respiratory failure, unspecified whether with hypoxia or hypercapnia: Secondary | ICD-10-CM | POA: Diagnosis not present

## 2022-07-06 DIAGNOSIS — M1990 Unspecified osteoarthritis, unspecified site: Secondary | ICD-10-CM | POA: Diagnosis not present

## 2022-07-06 DIAGNOSIS — I129 Hypertensive chronic kidney disease with stage 1 through stage 4 chronic kidney disease, or unspecified chronic kidney disease: Secondary | ICD-10-CM | POA: Diagnosis not present

## 2022-07-06 DIAGNOSIS — N189 Chronic kidney disease, unspecified: Secondary | ICD-10-CM | POA: Diagnosis not present

## 2022-07-06 DIAGNOSIS — E1122 Type 2 diabetes mellitus with diabetic chronic kidney disease: Secondary | ICD-10-CM | POA: Diagnosis not present

## 2022-07-06 DIAGNOSIS — C9030 Solitary plasmacytoma not having achieved remission: Secondary | ICD-10-CM | POA: Diagnosis not present

## 2022-07-06 DIAGNOSIS — Z5111 Encounter for antineoplastic chemotherapy: Secondary | ICD-10-CM | POA: Diagnosis not present

## 2022-07-06 DIAGNOSIS — Z794 Long term (current) use of insulin: Secondary | ICD-10-CM | POA: Diagnosis not present

## 2022-07-06 DIAGNOSIS — Z8049 Family history of malignant neoplasm of other genital organs: Secondary | ICD-10-CM | POA: Diagnosis not present

## 2022-07-06 DIAGNOSIS — C9000 Multiple myeloma not having achieved remission: Secondary | ICD-10-CM | POA: Diagnosis not present

## 2022-07-12 ENCOUNTER — Other Ambulatory Visit: Payer: Self-pay | Admitting: Family Medicine

## 2022-07-12 DIAGNOSIS — E1149 Type 2 diabetes mellitus with other diabetic neurological complication: Secondary | ICD-10-CM

## 2022-07-12 DIAGNOSIS — I152 Hypertension secondary to endocrine disorders: Secondary | ICD-10-CM

## 2022-07-12 DIAGNOSIS — E1159 Type 2 diabetes mellitus with other circulatory complications: Secondary | ICD-10-CM

## 2022-07-12 DIAGNOSIS — Z9189 Other specified personal risk factors, not elsewhere classified: Secondary | ICD-10-CM

## 2022-07-13 DIAGNOSIS — N189 Chronic kidney disease, unspecified: Secondary | ICD-10-CM | POA: Diagnosis not present

## 2022-07-13 DIAGNOSIS — Z5111 Encounter for antineoplastic chemotherapy: Secondary | ICD-10-CM | POA: Diagnosis not present

## 2022-07-13 DIAGNOSIS — I129 Hypertensive chronic kidney disease with stage 1 through stage 4 chronic kidney disease, or unspecified chronic kidney disease: Secondary | ICD-10-CM | POA: Diagnosis not present

## 2022-07-13 DIAGNOSIS — C9030 Solitary plasmacytoma not having achieved remission: Secondary | ICD-10-CM | POA: Diagnosis not present

## 2022-07-13 DIAGNOSIS — C9000 Multiple myeloma not having achieved remission: Secondary | ICD-10-CM | POA: Diagnosis not present

## 2022-07-13 DIAGNOSIS — E1122 Type 2 diabetes mellitus with diabetic chronic kidney disease: Secondary | ICD-10-CM | POA: Diagnosis not present

## 2022-07-13 DIAGNOSIS — M1990 Unspecified osteoarthritis, unspecified site: Secondary | ICD-10-CM | POA: Diagnosis not present

## 2022-07-13 DIAGNOSIS — Z8049 Family history of malignant neoplasm of other genital organs: Secondary | ICD-10-CM | POA: Diagnosis not present

## 2022-07-13 DIAGNOSIS — Z794 Long term (current) use of insulin: Secondary | ICD-10-CM | POA: Diagnosis not present

## 2022-07-15 DIAGNOSIS — C9000 Multiple myeloma not having achieved remission: Secondary | ICD-10-CM | POA: Diagnosis not present

## 2022-07-15 DIAGNOSIS — R768 Other specified abnormal immunological findings in serum: Secondary | ICD-10-CM | POA: Diagnosis not present

## 2022-07-20 DIAGNOSIS — E1122 Type 2 diabetes mellitus with diabetic chronic kidney disease: Secondary | ICD-10-CM | POA: Diagnosis not present

## 2022-07-20 DIAGNOSIS — Z794 Long term (current) use of insulin: Secondary | ICD-10-CM | POA: Diagnosis not present

## 2022-07-20 DIAGNOSIS — Z8049 Family history of malignant neoplasm of other genital organs: Secondary | ICD-10-CM | POA: Diagnosis not present

## 2022-07-20 DIAGNOSIS — Z5111 Encounter for antineoplastic chemotherapy: Secondary | ICD-10-CM | POA: Diagnosis not present

## 2022-07-20 DIAGNOSIS — M1990 Unspecified osteoarthritis, unspecified site: Secondary | ICD-10-CM | POA: Diagnosis not present

## 2022-07-20 DIAGNOSIS — C9000 Multiple myeloma not having achieved remission: Secondary | ICD-10-CM | POA: Diagnosis not present

## 2022-07-20 DIAGNOSIS — R635 Abnormal weight gain: Secondary | ICD-10-CM | POA: Diagnosis not present

## 2022-07-20 DIAGNOSIS — T380X5A Adverse effect of glucocorticoids and synthetic analogues, initial encounter: Secondary | ICD-10-CM | POA: Diagnosis not present

## 2022-07-20 DIAGNOSIS — R609 Edema, unspecified: Secondary | ICD-10-CM | POA: Diagnosis not present

## 2022-07-20 DIAGNOSIS — C9030 Solitary plasmacytoma not having achieved remission: Secondary | ICD-10-CM | POA: Diagnosis not present

## 2022-07-20 DIAGNOSIS — N189 Chronic kidney disease, unspecified: Secondary | ICD-10-CM | POA: Diagnosis not present

## 2022-07-20 DIAGNOSIS — I129 Hypertensive chronic kidney disease with stage 1 through stage 4 chronic kidney disease, or unspecified chronic kidney disease: Secondary | ICD-10-CM | POA: Diagnosis not present

## 2022-07-21 ENCOUNTER — Encounter: Payer: Self-pay | Admitting: Family Medicine

## 2022-07-22 DIAGNOSIS — E1122 Type 2 diabetes mellitus with diabetic chronic kidney disease: Secondary | ICD-10-CM | POA: Diagnosis not present

## 2022-07-22 DIAGNOSIS — C9030 Solitary plasmacytoma not having achieved remission: Secondary | ICD-10-CM | POA: Diagnosis not present

## 2022-07-22 DIAGNOSIS — Z794 Long term (current) use of insulin: Secondary | ICD-10-CM | POA: Diagnosis not present

## 2022-07-22 DIAGNOSIS — M1990 Unspecified osteoarthritis, unspecified site: Secondary | ICD-10-CM | POA: Diagnosis not present

## 2022-07-22 DIAGNOSIS — Z5111 Encounter for antineoplastic chemotherapy: Secondary | ICD-10-CM | POA: Diagnosis not present

## 2022-07-22 DIAGNOSIS — Z923 Personal history of irradiation: Secondary | ICD-10-CM | POA: Diagnosis not present

## 2022-07-22 DIAGNOSIS — Z8049 Family history of malignant neoplasm of other genital organs: Secondary | ICD-10-CM | POA: Diagnosis not present

## 2022-07-22 DIAGNOSIS — N189 Chronic kidney disease, unspecified: Secondary | ICD-10-CM | POA: Diagnosis not present

## 2022-07-22 DIAGNOSIS — C9000 Multiple myeloma not having achieved remission: Secondary | ICD-10-CM | POA: Diagnosis not present

## 2022-07-22 DIAGNOSIS — I129 Hypertensive chronic kidney disease with stage 1 through stage 4 chronic kidney disease, or unspecified chronic kidney disease: Secondary | ICD-10-CM | POA: Diagnosis not present

## 2022-07-27 DIAGNOSIS — M1990 Unspecified osteoarthritis, unspecified site: Secondary | ICD-10-CM | POA: Diagnosis not present

## 2022-07-27 DIAGNOSIS — I129 Hypertensive chronic kidney disease with stage 1 through stage 4 chronic kidney disease, or unspecified chronic kidney disease: Secondary | ICD-10-CM | POA: Diagnosis not present

## 2022-07-27 DIAGNOSIS — Z5111 Encounter for antineoplastic chemotherapy: Secondary | ICD-10-CM | POA: Diagnosis not present

## 2022-07-27 DIAGNOSIS — C9000 Multiple myeloma not having achieved remission: Secondary | ICD-10-CM | POA: Diagnosis not present

## 2022-07-27 DIAGNOSIS — Z91030 Bee allergy status: Secondary | ICD-10-CM | POA: Diagnosis not present

## 2022-07-27 DIAGNOSIS — E1122 Type 2 diabetes mellitus with diabetic chronic kidney disease: Secondary | ICD-10-CM | POA: Diagnosis not present

## 2022-07-27 DIAGNOSIS — N189 Chronic kidney disease, unspecified: Secondary | ICD-10-CM | POA: Diagnosis not present

## 2022-07-27 DIAGNOSIS — C9030 Solitary plasmacytoma not having achieved remission: Secondary | ICD-10-CM | POA: Diagnosis not present

## 2022-08-02 DIAGNOSIS — R1312 Dysphagia, oropharyngeal phase: Secondary | ICD-10-CM | POA: Diagnosis not present

## 2022-08-02 DIAGNOSIS — G629 Polyneuropathy, unspecified: Secondary | ICD-10-CM | POA: Diagnosis not present

## 2022-08-02 DIAGNOSIS — M6281 Muscle weakness (generalized): Secondary | ICD-10-CM | POA: Diagnosis not present

## 2022-08-03 DIAGNOSIS — T380X5D Adverse effect of glucocorticoids and synthetic analogues, subsequent encounter: Secondary | ICD-10-CM | POA: Diagnosis not present

## 2022-08-03 DIAGNOSIS — E1122 Type 2 diabetes mellitus with diabetic chronic kidney disease: Secondary | ICD-10-CM | POA: Diagnosis not present

## 2022-08-03 DIAGNOSIS — C9000 Multiple myeloma not having achieved remission: Secondary | ICD-10-CM | POA: Diagnosis not present

## 2022-08-03 DIAGNOSIS — R609 Edema, unspecified: Secondary | ICD-10-CM | POA: Diagnosis not present

## 2022-08-03 DIAGNOSIS — Z5111 Encounter for antineoplastic chemotherapy: Secondary | ICD-10-CM | POA: Diagnosis not present

## 2022-08-03 DIAGNOSIS — I129 Hypertensive chronic kidney disease with stage 1 through stage 4 chronic kidney disease, or unspecified chronic kidney disease: Secondary | ICD-10-CM | POA: Diagnosis not present

## 2022-08-03 DIAGNOSIS — R635 Abnormal weight gain: Secondary | ICD-10-CM | POA: Diagnosis not present

## 2022-08-03 DIAGNOSIS — N189 Chronic kidney disease, unspecified: Secondary | ICD-10-CM | POA: Diagnosis not present

## 2022-08-03 DIAGNOSIS — C9030 Solitary plasmacytoma not having achieved remission: Secondary | ICD-10-CM | POA: Diagnosis not present

## 2022-08-04 ENCOUNTER — Telehealth: Payer: Self-pay | Admitting: Family Medicine

## 2022-08-04 DIAGNOSIS — E1149 Type 2 diabetes mellitus with other diabetic neurological complication: Secondary | ICD-10-CM

## 2022-08-04 MED ORDER — TRULICITY 1.5 MG/0.5ML ~~LOC~~ SOAJ: 1.5000 mg | SUBCUTANEOUS | 2 refills | Status: DC

## 2022-08-04 MED ORDER — BASAGLAR KWIKPEN 100 UNIT/ML ~~LOC~~ SOPN: 30.0000 [IU] | PEN_INJECTOR | Freq: Every day | SUBCUTANEOUS | 11 refills | Status: DC

## 2022-08-04 MED ORDER — INSULIN LISPRO (1 UNIT DIAL) 100 UNIT/ML (KWIKPEN): 6.0000 [IU] | PEN_INJECTOR | Freq: Three times a day (TID) | SUBCUTANEOUS | 11 refills | Status: DC

## 2022-08-04 NOTE — Telephone Encounter (Signed)
Escribed trulicity, basaglar, humalog to [employer] order Can you make sure patient is enrolled for 2024? If not, send me PAP Can you let patient know?  Thanks!

## 2022-08-04 NOTE — Telephone Encounter (Signed)
Out of long acting insulin.   Wife called [employer]. Was told that they need to re file??  Using Ozempic currently since Trulicity has been on back order. Wife is afraid that pt is going to run out of the Ozempic as well.

## 2022-08-10 DIAGNOSIS — C9030 Solitary plasmacytoma not having achieved remission: Secondary | ICD-10-CM | POA: Diagnosis not present

## 2022-08-10 DIAGNOSIS — Z5111 Encounter for antineoplastic chemotherapy: Secondary | ICD-10-CM | POA: Diagnosis not present

## 2022-08-10 DIAGNOSIS — Z91030 Bee allergy status: Secondary | ICD-10-CM | POA: Diagnosis not present

## 2022-08-10 DIAGNOSIS — C9000 Multiple myeloma not having achieved remission: Secondary | ICD-10-CM | POA: Diagnosis not present

## 2022-08-12 NOTE — Telephone Encounter (Signed)
Submitted application for HUMALOG, TRULICITY, AND BASAGLAR to [employer] for patient assistance.   Phone: [phone]

## 2022-08-14 NOTE — Telephone Encounter (Signed)
Received notification from [facility] [employer] regarding approval for HUMALOG, TRULICITY, & BASAGLAR. Patient assistance approved from 08/12/22 to 04/27/23.  Phone: [phone]

## 2022-08-17 DIAGNOSIS — R635 Abnormal weight gain: Secondary | ICD-10-CM | POA: Diagnosis not present

## 2022-08-17 DIAGNOSIS — T380X5D Adverse effect of glucocorticoids and synthetic analogues, subsequent encounter: Secondary | ICD-10-CM | POA: Diagnosis not present

## 2022-08-17 DIAGNOSIS — C9000 Multiple myeloma not having achieved remission: Secondary | ICD-10-CM | POA: Diagnosis not present

## 2022-08-17 DIAGNOSIS — H00011 Hordeolum externum right upper eyelid: Secondary | ICD-10-CM | POA: Diagnosis not present

## 2022-08-17 DIAGNOSIS — C9030 Solitary plasmacytoma not having achieved remission: Secondary | ICD-10-CM | POA: Diagnosis not present

## 2022-08-17 DIAGNOSIS — Z91030 Bee allergy status: Secondary | ICD-10-CM | POA: Diagnosis not present

## 2022-08-17 DIAGNOSIS — Z5111 Encounter for antineoplastic chemotherapy: Secondary | ICD-10-CM | POA: Diagnosis not present

## 2022-08-18 ENCOUNTER — Encounter: Payer: Self-pay | Admitting: Pharmacist

## 2022-08-21 ENCOUNTER — Telehealth: Payer: Self-pay | Admitting: Family Medicine

## 2022-08-24 DIAGNOSIS — Z5111 Encounter for antineoplastic chemotherapy: Secondary | ICD-10-CM | POA: Diagnosis not present

## 2022-08-24 DIAGNOSIS — Z8049 Family history of malignant neoplasm of other genital organs: Secondary | ICD-10-CM | POA: Diagnosis not present

## 2022-08-24 DIAGNOSIS — I129 Hypertensive chronic kidney disease with stage 1 through stage 4 chronic kidney disease, or unspecified chronic kidney disease: Secondary | ICD-10-CM | POA: Diagnosis not present

## 2022-08-24 DIAGNOSIS — C9030 Solitary plasmacytoma not having achieved remission: Secondary | ICD-10-CM | POA: Diagnosis not present

## 2022-08-24 DIAGNOSIS — N189 Chronic kidney disease, unspecified: Secondary | ICD-10-CM | POA: Diagnosis not present

## 2022-08-24 DIAGNOSIS — E1122 Type 2 diabetes mellitus with diabetic chronic kidney disease: Secondary | ICD-10-CM | POA: Diagnosis not present

## 2022-08-24 DIAGNOSIS — Z91030 Bee allergy status: Secondary | ICD-10-CM | POA: Diagnosis not present

## 2022-08-24 DIAGNOSIS — C9000 Multiple myeloma not having achieved remission: Secondary | ICD-10-CM | POA: Diagnosis not present

## 2022-08-24 DIAGNOSIS — M1990 Unspecified osteoarthritis, unspecified site: Secondary | ICD-10-CM | POA: Diagnosis not present

## 2022-08-25 NOTE — Telephone Encounter (Signed)
[employer] to f/u on shipment.   Humalog processing for shipment as of 08/24/22 - should deliver this week.  Trulicity shipped 08/24/22 - should arrive today or tomorrow.    Provided this information to pt's wife. Wife says trulicity delivered today and will keep an eye out for the Humalog. Also let her know they can call [employer] directly for shipping information.

## 2022-08-28 ENCOUNTER — Encounter: Payer: Self-pay | Admitting: Family Medicine

## 2022-08-28 ENCOUNTER — Telehealth (INDEPENDENT_AMBULATORY_CARE_PROVIDER_SITE_OTHER): Payer: Medicare PPO | Admitting: Family Medicine

## 2022-08-28 DIAGNOSIS — M5442 Lumbago with sciatica, left side: Secondary | ICD-10-CM | POA: Diagnosis not present

## 2022-08-28 DIAGNOSIS — M8448XA Pathological fracture, other site, initial encounter for fracture: Secondary | ICD-10-CM | POA: Diagnosis not present

## 2022-08-28 DIAGNOSIS — Z79899 Other long term (current) drug therapy: Secondary | ICD-10-CM | POA: Diagnosis not present

## 2022-08-28 DIAGNOSIS — M5441 Lumbago with sciatica, right side: Secondary | ICD-10-CM

## 2022-08-28 MED ORDER — OXYCODONE-ACETAMINOPHEN 10-325 MG PO TABS: 1.0000 | ORAL_TABLET | Freq: Two times a day (BID) | ORAL | 0 refills | Status: DC | PRN

## 2022-08-28 NOTE — Progress Notes (Signed)
Virtual Visit via MyChart video note  I connected with [name]. on 08/28/22 at 1409 by video and verified that I am speaking with the correct person using two identifiers. [name]. is currently located at home and patient and wife  are currently with her during visit. The provider, Elige Radon Mc Bloodworth, MD is located in their office at time of visit.  Call ended at 1420  I discussed the limitations, risks, security and privacy concerns of performing an evaluation and management service by video and the availability of in person appointments. I also discussed with the patient that there may be a patient responsible charge related to this service. The patient expressed understanding and agreed to proceed.   History and Present Illness: Pain assessment: Cause of pain- spinal degeneration of lesion Pain location- lower back Pain on scale of 1-10- 5 Frequency- daily What increases pain-certain movements What makes pain Better-medication Effects on ADL - limited to bed mostly Any change in general medical condition-fight cancer treatment  Current opioids rx- percocet 10-325 mg bid prn # meds rx- 60 Effectiveness of current meds-works well Adverse reactions from pain meds-none Morphine equivalent- 20  Pill count performed-No Last drug screen - 12/26/21 ( high risk q10m, moderate risk q72m, low risk yearly ) Urine drug screen today- No Was the [facility] reviewed- yes  If yes were their any concerning findings? - got prescription for cancer doctor.  Pain contract signed on: 12/26/21  1. Controlled substance agreement signed   2. Acute bilateral low back pain with bilateral sciatica   3. Pathological fracture of vertebra, unspecified pathological cause, initial encounter     Outpatient Encounter Medications as of 08/28/2022  Medication Sig   amiodarone (PACERONE) 200 MG tablet Take 1 tablet (200 mg total) by mouth daily.   apixaban (ELIQUIS) 5 MG TABS tablet Take 1 tablet  (5 mg total) by mouth 2 (two) times daily.   aspirin EC 81 MG tablet Take 81 mg by mouth daily. Swallow whole.   atorvastatin (LIPITOR) 20 MG tablet TAKE 1 TABLET EVERY DAY   Continuous Blood Gluc Sensor (DEXCOM G7 SENSOR) MISC by Does not apply route. Use to test blood sugar continuously. Change sensor on arm every 10 days as directed DX: E11.65. Fills at [facility] per medicare contracts   diclofenac Sodium (VOLTAREN) 1 % GEL Apply topically 4 (four) times daily.   Dulaglutide (TRULICITY) 1.5 MG/0.5ML SOPN Inject 1.5 mg into the skin once a week. DX: E11.65   DULoxetine (CYMBALTA) 30 MG capsule Take 1 capsule (30 mg total) by mouth daily.   gabapentin (NEURONTIN) 400 MG capsule Take 2 capsules (800 mg total) by mouth 2 (two) times daily.   GNP ULTICARE PEN NEEDLES 32G X 6 MM MISC EVERY DAY   Insulin Glargine (BASAGLAR KWIKPEN) 100 UNIT/ML Inject 30-40 Units into the skin daily.   insulin lispro (HUMALOG KWIKPEN) 100 UNIT/ML KwikPen Inject 6-20 Units into the skin 3 (three) times daily with meals.   Melatonin 10 MG TBCR Take by mouth at bedtime.   metoprolol succinate (TOPROL-XL) 100 MG 24 hr tablet Take 1 tablet (100 mg total) by mouth daily.   nitroGLYCERIN (NITROSTAT) 0.4 MG SL tablet Place 0.4 mg under the tongue every 5 (five) minutes as needed for chest pain.   [START ON 09/27/2022] oxyCODONE-acetaminophen (PERCOCET) 10-325 MG tablet Take 1 tablet by mouth 2 (two) times daily as needed for pain.   oxyCODONE-acetaminophen (PERCOCET) 10-325 MG tablet Take 1 tablet by  mouth 2 (two) times daily as needed for pain.   [START ON 10/27/2022] oxyCODONE-acetaminophen (PERCOCET) 10-325 MG tablet Take 1 tablet by mouth 2 (two) times daily as needed for pain.   polyethylene glycol (MIRALAX / GLYCOLAX) 17 g packet Take 1 packet by mouth daily.   senna-docusate (SENOKOT-S) 8.6-50 MG tablet Take 2 tablets by mouth in the morning and at bedtime.   sulfamethoxazole-trimethoprim (BACTRIM DS) 800-160 MG tablet Take 1  tablet by mouth 2 (two) times daily.   [DISCONTINUED] oxyCODONE-acetaminophen (PERCOCET) 10-325 MG tablet Take 1 tablet by mouth 2 (two) times daily as needed for pain.   [DISCONTINUED] oxyCODONE-acetaminophen (PERCOCET) 10-325 MG tablet Take 1 tablet by mouth 2 (two) times daily as needed for pain.   No facility-administered encounter medications on file as of 08/28/2022.    Review of Systems  Constitutional:  Negative for chills and fever.  Respiratory:  Negative for shortness of breath and wheezing.   Cardiovascular:  Negative for chest pain and leg swelling.  Musculoskeletal:  Positive for back pain and myalgias. Negative for arthralgias, gait problem, neck pain and neck stiffness.  Skin:  Negative for rash.  All other systems reviewed and are negative.   Observations/Objective: Patient sounds comfortable and in no acute distress  Assessment and Plan: Problem List Items Addressed This Visit       Musculoskeletal and Integument   Pathologic fracture   Relevant Medications   oxyCODONE-acetaminophen (PERCOCET) 10-325 MG tablet (Start on 09/27/2022)   oxyCODONE-acetaminophen (PERCOCET) 10-325 MG tablet   oxyCODONE-acetaminophen (PERCOCET) 10-325 MG tablet (Start on 10/27/2022)     Other   Low back pain   Relevant Medications   oxyCODONE-acetaminophen (PERCOCET) 10-325 MG tablet (Start on 09/27/2022)   oxyCODONE-acetaminophen (PERCOCET) 10-325 MG tablet   oxyCODONE-acetaminophen (PERCOCET) 10-325 MG tablet (Start on 10/27/2022)   Other Visit Diagnoses     Controlled substance agreement signed       Relevant Medications   oxyCODONE-acetaminophen (PERCOCET) 10-325 MG tablet (Start on 09/27/2022)   oxyCODONE-acetaminophen (PERCOCET) 10-325 MG tablet   oxyCODONE-acetaminophen (PERCOCET) 10-325 MG tablet (Start on 10/27/2022)       Refills sent and see back in 3 months.  Follow up plan: Return in about 3 months (around 11/28/2022), or if symptoms worsen or fail to improve, for pain  management.     I discussed the assessment and treatment plan with the patient. The patient was provided an opportunity to ask questions and all were answered. The patient agreed with the plan and demonstrated an understanding of the instructions.   The patient was advised to call back or seek an in-person evaluation if the symptoms worsen or if the condition fails to improve as anticipated.  The above assessment and management plan was discussed with the patient. The patient verbalized understanding of and has agreed to the management plan. Patient is aware to call the clinic if symptoms persist or worsen. Patient is aware when to return to the clinic for a follow-up visit. Patient educated on when it is appropriate to go to the emergency department.    I provided 11 minutes of non-face-to-face time during this encounter.    Nils Pyle, MD

## 2022-08-31 DIAGNOSIS — M1990 Unspecified osteoarthritis, unspecified site: Secondary | ICD-10-CM | POA: Diagnosis not present

## 2022-08-31 DIAGNOSIS — Z5111 Encounter for antineoplastic chemotherapy: Secondary | ICD-10-CM | POA: Diagnosis not present

## 2022-08-31 DIAGNOSIS — N189 Chronic kidney disease, unspecified: Secondary | ICD-10-CM | POA: Diagnosis not present

## 2022-08-31 DIAGNOSIS — I129 Hypertensive chronic kidney disease with stage 1 through stage 4 chronic kidney disease, or unspecified chronic kidney disease: Secondary | ICD-10-CM | POA: Diagnosis not present

## 2022-08-31 DIAGNOSIS — Z91030 Bee allergy status: Secondary | ICD-10-CM | POA: Diagnosis not present

## 2022-08-31 DIAGNOSIS — C9000 Multiple myeloma not having achieved remission: Secondary | ICD-10-CM | POA: Diagnosis not present

## 2022-08-31 DIAGNOSIS — E1122 Type 2 diabetes mellitus with diabetic chronic kidney disease: Secondary | ICD-10-CM | POA: Diagnosis not present

## 2022-08-31 DIAGNOSIS — Z79899 Other long term (current) drug therapy: Secondary | ICD-10-CM | POA: Diagnosis not present

## 2022-08-31 DIAGNOSIS — C9030 Solitary plasmacytoma not having achieved remission: Secondary | ICD-10-CM | POA: Diagnosis not present

## 2022-09-07 DIAGNOSIS — Z794 Long term (current) use of insulin: Secondary | ICD-10-CM | POA: Diagnosis not present

## 2022-09-07 DIAGNOSIS — Z743 Need for continuous supervision: Secondary | ICD-10-CM | POA: Diagnosis not present

## 2022-09-07 DIAGNOSIS — I959 Hypotension, unspecified: Secondary | ICD-10-CM | POA: Diagnosis not present

## 2022-09-07 DIAGNOSIS — E1122 Type 2 diabetes mellitus with diabetic chronic kidney disease: Secondary | ICD-10-CM | POA: Diagnosis not present

## 2022-09-07 DIAGNOSIS — I10 Essential (primary) hypertension: Secondary | ICD-10-CM | POA: Diagnosis not present

## 2022-09-07 DIAGNOSIS — R531 Weakness: Secondary | ICD-10-CM | POA: Diagnosis not present

## 2022-09-07 DIAGNOSIS — R918 Other nonspecific abnormal finding of lung field: Secondary | ICD-10-CM | POA: Diagnosis not present

## 2022-09-07 DIAGNOSIS — E119 Type 2 diabetes mellitus without complications: Secondary | ICD-10-CM | POA: Diagnosis not present

## 2022-09-07 DIAGNOSIS — D696 Thrombocytopenia, unspecified: Secondary | ICD-10-CM | POA: Diagnosis not present

## 2022-09-07 DIAGNOSIS — Z7401 Bed confinement status: Secondary | ICD-10-CM | POA: Diagnosis not present

## 2022-09-08 ENCOUNTER — Telehealth: Payer: Self-pay | Admitting: *Deleted

## 2022-09-08 NOTE — Telephone Encounter (Signed)
Pt has been hypotensive with bps of 60/54 so he went to ER in [place] yesterday and had fluids, labs and chest xray and wife was told everything looked fine. He hasn't taken metoprolol since Sunday and latest BP reading was 105/60 about 30 minutes ago. He gets very dizzy and lightheaded when he sits up. Wife states he is drinking 48 oz of water and 30 oz of gatorade daily. He has been having hypotension since starting Chemo but the episodes where he is almost "passing out" just started 08/31/2022. Oncology had given him him furosemide 20 mg but hasn't taken that in over a week. Pt is not having any SOB or edema in legs. Per wife his blood sugars are ranging 100-150 FBS.Please advise.

## 2022-09-08 NOTE — Telephone Encounter (Signed)
He needs to hold metoprolol and schedule in office appointment with DOD.

## 2022-09-08 NOTE — Telephone Encounter (Signed)
Patient aware appt could only be made for virtual per patients wife

## 2022-09-09 ENCOUNTER — Telehealth (INDEPENDENT_AMBULATORY_CARE_PROVIDER_SITE_OTHER): Payer: Medicare PPO | Admitting: Family Medicine

## 2022-09-09 DIAGNOSIS — I950 Idiopathic hypotension: Secondary | ICD-10-CM

## 2022-09-09 NOTE — Progress Notes (Signed)
Subjective:  Patient ID: [name]., male    DOB: [birth year]  Age: 67 y.o. MRN: [id number]  CC: No chief complaint on file.   HPI [name]. presents for bp bottoming out. Went off of metoprolol. Dced furosemide a week ago. Makes him lightheaded, vision blurs. Feels he will pass out. Has had syncope twice last week and again. Two mornings ago 60/54.  Laying down was 104/61, pulse 58  116/62 pulse 62. Bottoms out when moving from chair to wheelchair  On chemo. Last treatment postponed  due to concern      03/18/2022    1:05 PM 02/13/2022    1:10 PM 12/22/2021    2:55 PM  Depression screen PHQ 2/9  Decreased Interest 0 0 0  Down, Depressed, Hopeless 0 0 0  PHQ - 2 Score 0 0 0  Altered sleeping 0 1   Tired, decreased energy 0 2   Change in appetite 0 1   Feeling bad or failure about yourself  0 0   Trouble concentrating 0 0   Moving slowly or fidgety/restless 0 1   Suicidal thoughts 0 0   PHQ-9 Score 0 5   Difficult doing work/chores Not difficult at all Somewhat difficult     History [name] has a past medical history of Cataract, Diabetes mellitus without complication (HCC), and Neuropathy.   He has a past surgical history that includes Eye surgery; Back surgery; Total hip arthroplasty (Bilateral); Ankle surgery (Right); Gastroplasty (1978); and IR Fluoro Guide Ndl Plmt / BX (03/27/2021).   His family history includes Diabetes in his father and mother; Heart disease in his father and mother; Uterine cancer in his mother.He reports that he has never smoked. He has never used smokeless tobacco. He reports that he does not drink alcohol and does not use drugs.    ROS Review of Systems  Constitutional:  Negative for fever.  Respiratory:  Negative for shortness of breath.   Cardiovascular:  Negative for chest pain.  Musculoskeletal:  Negative for arthralgias.  Skin:  Negative for rash.  Neurological:  Positive for weakness and light-headedness.     Objective:  There were no vitals taken for this visit.  BP Readings from Last 3 Encounters:  02/13/22 (!) 89/61  12/22/21 95/68  11/13/21 (!) 88/60    Wt Readings from Last 3 Encounters:  02/13/22 251 lb (113.9 kg)  12/22/21 265 lb (120.2 kg)  11/13/21 265 lb (120.2 kg)     Physical Exam  Video performed  Assessment & Plan:   Diagnoses and all orders for this visit:  Idiopathic hypotension  Other orders -     midodrine (PROAMATINE) 5 MG tablet; Take 1 tablet (5 mg total) by mouth 3 (three) times daily with meals.       I have discontinued [name]. "[name]"'s gabapentin and metoprolol succinate. I am also having him start on midodrine. Additionally, I am having him maintain his aspirin EC, nitroGLYCERIN, GNP UltiCare Pen Needles, diclofenac Sodium, Melatonin, Dexcom G7 Sensor, polyethylene glycol, senna-docusate, sulfamethoxazole-trimethoprim, amiodarone, apixaban, DULoxetine, atorvastatin, Trulicity, Basaglar KwikPen, insulin lispro, oxyCODONE-acetaminophen, oxyCODONE-acetaminophen, and oxyCODONE-acetaminophen.  Allergies as of 09/09/2022       Reactions   Bee Venom         Medication List        Accurate as of Sep 09, 2022 11:59 PM. If you have any questions, ask your nurse or doctor.  STOP taking these medications    gabapentin 400 MG capsule Commonly known as: Neurontin   metoprolol succinate 100 MG 24 hr tablet Commonly known as: TOPROL-XL       TAKE these medications    amiodarone 200 MG tablet Commonly known as: PACERONE Take 1 tablet (200 mg total) by mouth daily.   apixaban 5 MG Tabs tablet Commonly known as: Eliquis Take 1 tablet (5 mg total) by mouth 2 (two) times daily.   aspirin EC 81 MG tablet Take 81 mg by mouth daily. Swallow whole.   atorvastatin 20 MG tablet Commonly known as: LIPITOR TAKE 1 TABLET EVERY DAY   Basaglar KwikPen 100 UNIT/ML Inject 30-40 Units into the skin daily.   Dexcom G7  Sensor Misc by Does not apply route. Use to test blood sugar continuously. Change sensor on arm every 10 days as directed DX: E11.65. Fills at [facility] per medicare contracts   diclofenac Sodium 1 % Gel Commonly known as: VOLTAREN Apply topically 4 (four) times daily.   DULoxetine 30 MG capsule Commonly known as: CYMBALTA Take 1 capsule (30 mg total) by mouth daily.   GNP UltiCare Pen Needles 32G X 6 MM Misc Generic drug: Insulin Pen Needle EVERY DAY   insulin lispro 100 UNIT/ML KwikPen Commonly known as: HumaLOG KwikPen Inject 6-20 Units into the skin 3 (three) times daily with meals.   Melatonin 10 MG Tbcr Take by mouth at bedtime.   midodrine 5 MG tablet Commonly known as: PROAMATINE Take 1 tablet (5 mg total) by mouth 3 (three) times daily with meals.   nitroGLYCERIN 0.4 MG SL tablet Commonly known as: NITROSTAT Place 0.4 mg under the tongue every 5 (five) minutes as needed for chest pain.   oxyCODONE-acetaminophen 10-325 MG tablet Commonly known as: PERCOCET Take 1 tablet by mouth 2 (two) times daily as needed for pain.   oxyCODONE-acetaminophen 10-325 MG tablet Commonly known as: PERCOCET Take 1 tablet by mouth 2 (two) times daily as needed for pain. Start taking on: September 27, 2022   oxyCODONE-acetaminophen 10-325 MG tablet Commonly known as: PERCOCET Take 1 tablet by mouth 2 (two) times daily as needed for pain. Start taking on: October 27, 2022   polyethylene glycol 17 g packet Commonly known as: MIRALAX / GLYCOLAX Take 1 packet by mouth daily.   senna-docusate 8.6-50 MG tablet Commonly known as: Senokot-S Take 2 tablets by mouth in the morning and at bedtime.   sulfamethoxazole-trimethoprim 800-160 MG tablet Commonly known as: Bactrim DS Take 1 tablet by mouth 2 (two) times daily.   Trulicity 1.5 MG/0.5ML Sopn Generic drug: Dulaglutide Inject 1.5 mg into the skin once a week. DX: E11.65       Video Note  I discussed the limitations, risks, security and  privacy concerns of performing an evaluation and management service by video and the availability of in person appointments. I also discussed with the patient that there may be a patient responsible charge related to this service. The patient expressed understanding and agreed to proceed. Pt. Is at home. Dr. Darlyn Read is in his office.  Follow Up Instructions:   I discussed the assessment and treatment plan with the patient. The patient was provided an opportunity to ask questions and all were answered. The patient agreed with the plan and demonstrated an understanding of the instructions.   The patient was advised to call back or seek an in-person evaluation if the symptoms worsen or if the condition fails to improve as anticipated.  Total  minutes including chart review and phone contact time: 18   Follow-up: Return in about 2 weeks (around 09/23/2022).  Mechele Claude, M.D.

## 2022-09-10 ENCOUNTER — Telehealth: Payer: Self-pay | Admitting: Family Medicine

## 2022-09-10 MED ORDER — MIDODRINE HCL 5 MG PO TABS: 5.0000 mg | ORAL_TABLET | Freq: Three times a day (TID) | ORAL | 1 refills | Status: DC

## 2022-09-10 NOTE — Telephone Encounter (Signed)
Sorry, I accidentally pended it instead of signing. It has been sent now. Please let his wife know.

## 2022-09-10 NOTE — Telephone Encounter (Signed)
Patient aware and verbalized understanding. °

## 2022-09-15 ENCOUNTER — Encounter: Payer: Self-pay | Admitting: Family Medicine

## 2022-09-15 ENCOUNTER — Telehealth: Payer: Self-pay | Admitting: Family Medicine

## 2022-09-15 DIAGNOSIS — E1149 Type 2 diabetes mellitus with other diabetic neurological complication: Secondary | ICD-10-CM | POA: Diagnosis not present

## 2022-09-15 DIAGNOSIS — I2510 Atherosclerotic heart disease of native coronary artery without angina pectoris: Secondary | ICD-10-CM

## 2022-09-15 NOTE — Telephone Encounter (Signed)
FYI: Stacks put on Midodrine last week Pt is some better per wife.  [facility] (onco) appt was tomorrow, r/s til June 12th because oncology wants him to see cardio for echo.  Can [facility] refer to cardio...close to home?  Wife is not sure why he needs to have echo. Asked why oncology did not order and wife states that she told them PCP was managing pts BP???  Does not like doctor with [facility]- Dr. Wilford Grist.

## 2022-09-16 NOTE — Telephone Encounter (Signed)
Please refer him to cardiology here closer, diagnosis CAD

## 2022-09-16 NOTE — Telephone Encounter (Signed)
Referral placed. Wife made aware. Will call back if needed.

## 2022-09-24 ENCOUNTER — Other Ambulatory Visit: Payer: Self-pay | Admitting: Family Medicine

## 2022-09-24 DIAGNOSIS — E1159 Type 2 diabetes mellitus with other circulatory complications: Secondary | ICD-10-CM

## 2022-09-24 DIAGNOSIS — E1149 Type 2 diabetes mellitus with other diabetic neurological complication: Secondary | ICD-10-CM

## 2022-09-24 DIAGNOSIS — Z9189 Other specified personal risk factors, not elsewhere classified: Secondary | ICD-10-CM

## 2022-09-24 NOTE — Telephone Encounter (Signed)
[facility] NTBS for 6 mos FU RF NOT sent to pharmacy

## 2022-09-24 NOTE — Telephone Encounter (Signed)
I called pt & spoke with his daughter about making him an appt to come in tbs for appt & fasting labs. She will call back, when she has her calendar/list of his other appt's with her. Daughter is aware that he needs this appt/labs to get RX refilled at this time.

## 2022-09-28 DIAGNOSIS — C9000 Multiple myeloma not having achieved remission: Secondary | ICD-10-CM | POA: Diagnosis not present

## 2022-09-28 DIAGNOSIS — Z91030 Bee allergy status: Secondary | ICD-10-CM | POA: Diagnosis not present

## 2022-09-28 DIAGNOSIS — I129 Hypertensive chronic kidney disease with stage 1 through stage 4 chronic kidney disease, or unspecified chronic kidney disease: Secondary | ICD-10-CM | POA: Diagnosis not present

## 2022-09-28 DIAGNOSIS — M1990 Unspecified osteoarthritis, unspecified site: Secondary | ICD-10-CM | POA: Diagnosis not present

## 2022-09-28 DIAGNOSIS — E1122 Type 2 diabetes mellitus with diabetic chronic kidney disease: Secondary | ICD-10-CM | POA: Diagnosis not present

## 2022-09-28 DIAGNOSIS — C9030 Solitary plasmacytoma not having achieved remission: Secondary | ICD-10-CM | POA: Diagnosis not present

## 2022-09-28 DIAGNOSIS — Z5111 Encounter for antineoplastic chemotherapy: Secondary | ICD-10-CM | POA: Diagnosis not present

## 2022-09-28 DIAGNOSIS — Z79899 Other long term (current) drug therapy: Secondary | ICD-10-CM | POA: Diagnosis not present

## 2022-09-28 DIAGNOSIS — N189 Chronic kidney disease, unspecified: Secondary | ICD-10-CM | POA: Diagnosis not present

## 2022-10-02 ENCOUNTER — Other Ambulatory Visit: Payer: Self-pay | Admitting: Family Medicine

## 2022-10-05 DIAGNOSIS — Z5111 Encounter for antineoplastic chemotherapy: Secondary | ICD-10-CM | POA: Diagnosis not present

## 2022-10-05 DIAGNOSIS — T380X5A Adverse effect of glucocorticoids and synthetic analogues, initial encounter: Secondary | ICD-10-CM | POA: Diagnosis not present

## 2022-10-05 DIAGNOSIS — I129 Hypertensive chronic kidney disease with stage 1 through stage 4 chronic kidney disease, or unspecified chronic kidney disease: Secondary | ICD-10-CM | POA: Diagnosis not present

## 2022-10-05 DIAGNOSIS — M1990 Unspecified osteoarthritis, unspecified site: Secondary | ICD-10-CM | POA: Diagnosis not present

## 2022-10-05 DIAGNOSIS — C9030 Solitary plasmacytoma not having achieved remission: Secondary | ICD-10-CM | POA: Diagnosis not present

## 2022-10-05 DIAGNOSIS — E1122 Type 2 diabetes mellitus with diabetic chronic kidney disease: Secondary | ICD-10-CM | POA: Diagnosis not present

## 2022-10-05 DIAGNOSIS — R635 Abnormal weight gain: Secondary | ICD-10-CM | POA: Diagnosis not present

## 2022-10-05 DIAGNOSIS — N189 Chronic kidney disease, unspecified: Secondary | ICD-10-CM | POA: Diagnosis not present

## 2022-10-05 DIAGNOSIS — C9000 Multiple myeloma not having achieved remission: Secondary | ICD-10-CM | POA: Diagnosis not present

## 2022-10-07 ENCOUNTER — Telehealth: Payer: Self-pay | Admitting: Family Medicine

## 2022-10-07 DIAGNOSIS — C9000 Multiple myeloma not having achieved remission: Secondary | ICD-10-CM | POA: Diagnosis not present

## 2022-10-07 DIAGNOSIS — R0609 Other forms of dyspnea: Secondary | ICD-10-CM | POA: Diagnosis not present

## 2022-10-08 NOTE — Telephone Encounter (Signed)
Spoke with wife and clarified that patient is supposed to take oxycodone twice daily.  Scheduled follow up appointment for 12/02/22

## 2022-10-08 NOTE — Telephone Encounter (Signed)
Spoke with patients wife about Oxycodone and told her patient had three refills but stated that [facility] was supposed to call in #90 instead of #60.I show the RX was called in for #60 and to take one two times a day. Please call patient and advised.

## 2022-10-12 DIAGNOSIS — C9000 Multiple myeloma not having achieved remission: Secondary | ICD-10-CM | POA: Diagnosis not present

## 2022-10-12 DIAGNOSIS — N189 Chronic kidney disease, unspecified: Secondary | ICD-10-CM | POA: Diagnosis not present

## 2022-10-12 DIAGNOSIS — I129 Hypertensive chronic kidney disease with stage 1 through stage 4 chronic kidney disease, or unspecified chronic kidney disease: Secondary | ICD-10-CM | POA: Diagnosis not present

## 2022-10-12 DIAGNOSIS — Z5111 Encounter for antineoplastic chemotherapy: Secondary | ICD-10-CM | POA: Diagnosis not present

## 2022-10-12 DIAGNOSIS — M1990 Unspecified osteoarthritis, unspecified site: Secondary | ICD-10-CM | POA: Diagnosis not present

## 2022-10-12 DIAGNOSIS — E1122 Type 2 diabetes mellitus with diabetic chronic kidney disease: Secondary | ICD-10-CM | POA: Diagnosis not present

## 2022-10-12 DIAGNOSIS — Z91030 Bee allergy status: Secondary | ICD-10-CM | POA: Diagnosis not present

## 2022-10-12 DIAGNOSIS — Z79899 Other long term (current) drug therapy: Secondary | ICD-10-CM | POA: Diagnosis not present

## 2022-10-12 DIAGNOSIS — C9030 Solitary plasmacytoma not having achieved remission: Secondary | ICD-10-CM | POA: Diagnosis not present

## 2022-10-13 ENCOUNTER — Encounter: Payer: Self-pay | Admitting: Internal Medicine

## 2022-10-13 ENCOUNTER — Ambulatory Visit: Payer: Medicare PPO | Attending: Internal Medicine | Admitting: Internal Medicine

## 2022-10-13 ENCOUNTER — Other Ambulatory Visit: Payer: Self-pay

## 2022-10-13 VITALS — BP 104/66 | HR 87 | Ht 75.0 in

## 2022-10-13 DIAGNOSIS — I2510 Atherosclerotic heart disease of native coronary artery without angina pectoris: Secondary | ICD-10-CM

## 2022-10-13 DIAGNOSIS — E859 Amyloidosis, unspecified: Secondary | ICD-10-CM

## 2022-10-13 DIAGNOSIS — E1159 Type 2 diabetes mellitus with other circulatory complications: Secondary | ICD-10-CM | POA: Diagnosis not present

## 2022-10-13 DIAGNOSIS — I4891 Unspecified atrial fibrillation: Secondary | ICD-10-CM | POA: Insufficient documentation

## 2022-10-13 DIAGNOSIS — I152 Hypertension secondary to endocrine disorders: Secondary | ICD-10-CM | POA: Diagnosis not present

## 2022-10-13 NOTE — Patient Instructions (Addendum)
3 monthMedication Instructions:  Your physician has recommended you make the following change in your medication: Stop Asprin  Labwork: Serum, Protein, and Protein Electophoresis  Testing/Procedures: Your physician has recommended that you have a sleep study. This test records several body functions during sleep, including: brain activity, eye movement, oxygen and carbon dioxide blood levels, heart rate and rhythm, breathing rate and rhythm, the flow of air through your mouth and nose, snoring, body muscle movements, and chest and belly movement.   Follow-Up: Your physician recommends that you schedule a follow-up appointment in: 3 months  Any Other Special Instructions Will Be Listed Below (If Applicable).  If you need a refill on your cardiac medications before your next appointment, please call your pharmacy.

## 2022-10-13 NOTE — Progress Notes (Signed)
Cardiology Office Note  Date: 10/13/2022   ID: [name]., DOB 1955/08/17, MRN [id number]  PCP:  Dettinger, Elige Radon, MD  Cardiologist:  Marjo Bicker, MD Electrophysiologist:  None   Reason for Office Visit: Evaluation of cardiac amyloidosis at the request of Dr. Louanne Skye   History of Present Illness: [name]. is a 67 y.o. male known to have postop A-fib s/p DCCV in 12/2021 at [facility], DM 2, multiple myeloma on chemo and radiation was referred to cardiology clinic for evaluation of cardiac amyloidosis.  Patient had postop atrial fibrillation in 12/2021 and has been in RVR requiring DCCV.  Since then, he has been on amiodarone 200 mg once daily and Eliquis 5 mg twice daily.  Currently not on any rate controlling agents due to soft blood pressures, on midodrine 5 mg twice daily right now.  Denies any palpitations.  However he has severe fatigue, SOB since 04/2022.  No syncope, leg swelling, angina.  No orthopnea or PND.  He was seen by his oncologist and there was a concern for AL amyloidosis and hence referred to cardiology.  Echocardiogram from 12/2021 at [facility] showed normal LVEF, normal RV EF, mild LVH, no valvular heart disease.  Patient was never tested for OSA in the past  Past Medical History:  Diagnosis Date   Cataract    Diabetes mellitus without complication (HCC)    Neuropathy     Past Surgical History:  Procedure Laterality Date   ANKLE SURGERY Right    BACK SURGERY     EYE SURGERY     cateracts   GASTROPLASTY  1978   IR FLUORO GUIDED NEEDLE PLC ASPIRATION/INJECTION LOC  03/27/2021   TOTAL HIP ARTHROPLASTY Bilateral     Current Outpatient Medications  Medication Sig Dispense Refill   amiodarone (PACERONE) 200 MG tablet Take 1 tablet (200 mg total) by mouth daily. 30 tablet 5   apixaban (ELIQUIS) 5 MG TABS tablet Take 1 tablet (5 mg total) by mouth 2 (two) times daily. 60 tablet 5   atorvastatin (LIPITOR) 20 MG tablet TAKE 1 TABLET EVERY DAY 90  tablet 0   Continuous Blood Gluc Sensor (DEXCOM G7 SENSOR) MISC by Does not apply route. Use to test blood sugar continuously. Change sensor on arm every 10 days as directed DX: E11.65. Fills at [facility] per medicare contracts     diclofenac Sodium (VOLTAREN) 1 % GEL Apply topically 4 (four) times daily.     Dulaglutide (TRULICITY) 1.5 MG/0.5ML SOPN Inject 1.5 mg into the skin once a week. DX: E11.65 6 mL 2   DULoxetine (CYMBALTA) 30 MG capsule Take 1 capsule (30 mg total) by mouth daily. 30 capsule 5   GNP ULTICARE PEN NEEDLES 32G X 6 MM MISC EVERY DAY 100 each 2   Insulin Glargine (BASAGLAR KWIKPEN) 100 UNIT/ML Inject 30-40 Units into the skin daily. 45 mL 11   insulin lispro (HUMALOG KWIKPEN) 100 UNIT/ML KwikPen Inject 6-20 Units into the skin 3 (three) times daily with meals. 45 mL 11   Melatonin 10 MG TBCR Take by mouth at bedtime.     midodrine (PROAMATINE) 5 MG tablet TAKE 1 TABLET BY MOUTH 3 TIMES DAILY WITH MEALS. 270 tablet 0   nitroGLYCERIN (NITROSTAT) 0.4 MG SL tablet Place 0.4 mg under the tongue every 5 (five) minutes as needed for chest pain.     oxyCODONE-acetaminophen (PERCOCET) 10-325 MG tablet Take 1 tablet by mouth 2 (two) times daily as needed for pain.  60 tablet 0   oxyCODONE-acetaminophen (PERCOCET) 10-325 MG tablet Take 1 tablet by mouth 2 (two) times daily as needed for pain. 60 tablet 0   [START ON 10/27/2022] oxyCODONE-acetaminophen (PERCOCET) 10-325 MG tablet Take 1 tablet by mouth 2 (two) times daily as needed for pain. 60 tablet 0   polyethylene glycol (MIRALAX / GLYCOLAX) 17 g packet Take 1 packet by mouth daily.     Potassium Chloride ER 20 MEQ TBCR Take 1 tablet by mouth in the morning and at bedtime.     senna-docusate (SENOKOT-S) 8.6-50 MG tablet Take 2 tablets by mouth in the morning and at bedtime.     sulfamethoxazole-trimethoprim (BACTRIM DS) 800-160 MG tablet Take 1 tablet by mouth 2 (two) times daily. 20 tablet 0   No current facility-administered medications for  this visit.   Allergies:  Bee venom   Social History: The patient  reports that he has never smoked. He has never used smokeless tobacco. He reports that he does not drink alcohol and does not use drugs.   Family History: The patient's family history includes Diabetes in his father and mother; Heart disease in his father and mother; Uterine cancer in his mother.   ROS:  Please see the history of present illness. Otherwise, complete review of systems is positive for none  All other systems are reviewed and negative.   Physical Exam: VS:  BP 104/66   Pulse 87   Ht 6\' 3"  (1.905 m)   SpO2 99%   BMI 31.37 kg/m , BMI Body mass index is 31.37 kg/m.  Wt Readings from Last 3 Encounters:  02/13/22 251 lb (113.9 kg)  12/22/21 265 lb (120.2 kg)  11/13/21 265 lb (120.2 kg)    General: Patient appears comfortable at rest. HEENT: Conjunctiva and lids normal, oropharynx clear with moist mucosa. Neck: Supple, no elevated JVP or carotid bruits, no thyromegaly. Lungs: Clear to auscultation, nonlabored breathing at rest. Cardiac: Regular rate and rhythm, no S3 or significant systolic murmur, no pericardial rub. Abdomen: Soft, nontender, no hepatomegaly, bowel sounds present, no guarding or rebound. Extremities: No pitting edema, distal pulses 2+. Skin: Warm and dry. Musculoskeletal: No kyphosis. Neuropsychiatric: Alert and oriented x3, affect grossly appropriate.  Recent Labwork: 02/13/2022: Hemoglobin 10.3; Platelets 336     Component Value Date/Time   CHOL 75 (L) 02/13/2022 1316   TRIG 59 02/13/2022 1316   HDL 36 (L) 02/13/2022 1316   CHOLHDL 2.1 02/13/2022 1316   LDLCALC 25 02/13/2022 1316    Other Studies Reviewed Today:   Assessment and Plan:  Patient is a 67 year old M known to have postop A-fib s/p DCCV in 12/2021 at [facility], DM 2, multiple myeloma on chemo and radiation was referred to cardiology clinic for evaluation of cardiac amyloidosis.  # Postoperative A-fib s/p DCCV in  12/2021 -Continue amiodarone 200 mg once daily -Continue Eliquis 5 mg twice daily -EKG obtained today showed undetermined rhythm, NSR versus atrial flutter, very hard to say.  Will obtain post DCCV EKG from [facility] and compare. -Obtain home sleep study for OSA evaluation  # Multiple myeloma on chemo and radiation # Concern for AL amyloidosis, cardiac -Patient's oncologist concerned about the possibility of AL amyloidosis, cardiac. Will obtain serum and urine protein electrophoresis with immunofixation and serum free light chain assay.  Echocardiogram from 12/2021 showed normal LVEF, normal RV EF, mild LVH and no valvular heart disease. Per his oncologist note, he will be scheduled for fat pad biopsy to rule out amyloidosis.  I have spent a total of 45 minutes with patient reviewing chart, EKGs, labs and examining patient as well as establishing an assessment and plan that was discussed with the patient.  > 50% of time was spent in direct patient care.    Medication Adjustments/Labs and Tests Ordered: Current medicines are reviewed at length with the patient today.  Concerns regarding medicines are outlined above.   Tests Ordered: Orders Placed This Encounter  Procedures   PE and FLC, Serum   Albumin   Protein electrophoresis, urine   EKG 12-Lead   [facility] Sleep Study    Medication Changes: No orders of the defined types were placed in this encounter.   Disposition:  Follow up  3 months  Signed Kamrin Sibley Verne Spurr, MD, 10/13/2022 2:37 PM    [facility] [address] [place], [place], [place] [address]

## 2022-10-15 DIAGNOSIS — I152 Hypertension secondary to endocrine disorders: Secondary | ICD-10-CM | POA: Diagnosis not present

## 2022-10-15 DIAGNOSIS — E859 Amyloidosis, unspecified: Secondary | ICD-10-CM | POA: Diagnosis not present

## 2022-10-15 DIAGNOSIS — E1159 Type 2 diabetes mellitus with other circulatory complications: Secondary | ICD-10-CM | POA: Diagnosis not present

## 2022-10-19 ENCOUNTER — Telehealth: Payer: Self-pay | Admitting: Family Medicine

## 2022-10-19 DIAGNOSIS — Z91030 Bee allergy status: Secondary | ICD-10-CM | POA: Diagnosis not present

## 2022-10-19 DIAGNOSIS — Z5111 Encounter for antineoplastic chemotherapy: Secondary | ICD-10-CM | POA: Diagnosis not present

## 2022-10-19 DIAGNOSIS — C9030 Solitary plasmacytoma not having achieved remission: Secondary | ICD-10-CM | POA: Diagnosis not present

## 2022-10-19 DIAGNOSIS — C9000 Multiple myeloma not having achieved remission: Secondary | ICD-10-CM | POA: Diagnosis not present

## 2022-10-19 DIAGNOSIS — Z8049 Family history of malignant neoplasm of other genital organs: Secondary | ICD-10-CM | POA: Diagnosis not present

## 2022-10-20 NOTE — Telephone Encounter (Signed)
Informed that there has to be an visit w/ documented notes for need of the scotter, also request for oxygen, this will all be done at his 12/02/22 visit.

## 2022-10-23 DIAGNOSIS — E65 Localized adiposity: Secondary | ICD-10-CM | POA: Diagnosis not present

## 2022-10-23 DIAGNOSIS — C9000 Multiple myeloma not having achieved remission: Secondary | ICD-10-CM | POA: Diagnosis not present

## 2022-10-23 DIAGNOSIS — E8589 Other amyloidosis: Secondary | ICD-10-CM | POA: Diagnosis not present

## 2022-10-26 DIAGNOSIS — Z8049 Family history of malignant neoplasm of other genital organs: Secondary | ICD-10-CM | POA: Diagnosis not present

## 2022-10-26 DIAGNOSIS — Z91030 Bee allergy status: Secondary | ICD-10-CM | POA: Diagnosis not present

## 2022-10-26 DIAGNOSIS — C9000 Multiple myeloma not having achieved remission: Secondary | ICD-10-CM | POA: Diagnosis not present

## 2022-10-26 DIAGNOSIS — C9030 Solitary plasmacytoma not having achieved remission: Secondary | ICD-10-CM | POA: Diagnosis not present

## 2022-10-27 ENCOUNTER — Telehealth: Payer: Self-pay | Admitting: Internal Medicine

## 2022-10-27 NOTE — Telephone Encounter (Signed)
Wife stated patient received his Watchpat sleep equipment and wants a call back for directions and next step.

## 2022-11-04 NOTE — Telephone Encounter (Signed)
Prior Authorization for ITAMAR sent to [employer] via web portal. Tracking Number . NO PA REQ 

## 2022-11-05 NOTE — Telephone Encounter (Signed)
Called patient to inform then of no auth req and can proceed with sleep study. Provided pin and instructions. Wife Victorino Dike) verbalized understanding.

## 2022-11-06 DIAGNOSIS — C9000 Multiple myeloma not having achieved remission: Secondary | ICD-10-CM | POA: Diagnosis not present

## 2022-11-06 DIAGNOSIS — B9620 Unspecified Escherichia coli [E. coli] as the cause of diseases classified elsewhere: Secondary | ICD-10-CM | POA: Diagnosis not present

## 2022-11-06 DIAGNOSIS — M47814 Spondylosis without myelopathy or radiculopathy, thoracic region: Secondary | ICD-10-CM | POA: Diagnosis not present

## 2022-11-06 DIAGNOSIS — I129 Hypertensive chronic kidney disease with stage 1 through stage 4 chronic kidney disease, or unspecified chronic kidney disease: Secondary | ICD-10-CM | POA: Diagnosis not present

## 2022-11-06 DIAGNOSIS — J9811 Atelectasis: Secondary | ICD-10-CM | POA: Diagnosis not present

## 2022-11-06 DIAGNOSIS — N390 Urinary tract infection, site not specified: Secondary | ICD-10-CM | POA: Diagnosis not present

## 2022-11-06 DIAGNOSIS — I700 Atherosclerosis of aorta: Secondary | ICD-10-CM | POA: Diagnosis not present

## 2022-11-06 DIAGNOSIS — Z794 Long term (current) use of insulin: Secondary | ICD-10-CM | POA: Diagnosis not present

## 2022-11-06 DIAGNOSIS — Z7901 Long term (current) use of anticoagulants: Secondary | ICD-10-CM | POA: Diagnosis not present

## 2022-11-06 DIAGNOSIS — I951 Orthostatic hypotension: Secondary | ICD-10-CM | POA: Diagnosis not present

## 2022-11-06 DIAGNOSIS — Z66 Do not resuscitate: Secondary | ICD-10-CM | POA: Diagnosis not present

## 2022-11-06 DIAGNOSIS — R61 Generalized hyperhidrosis: Secondary | ICD-10-CM | POA: Diagnosis not present

## 2022-11-06 DIAGNOSIS — E1122 Type 2 diabetes mellitus with diabetic chronic kidney disease: Secondary | ICD-10-CM | POA: Diagnosis not present

## 2022-11-06 DIAGNOSIS — E1142 Type 2 diabetes mellitus with diabetic polyneuropathy: Secondary | ICD-10-CM | POA: Diagnosis not present

## 2022-11-06 DIAGNOSIS — I959 Hypotension, unspecified: Secondary | ICD-10-CM | POA: Diagnosis not present

## 2022-11-06 DIAGNOSIS — R55 Syncope and collapse: Secondary | ICD-10-CM | POA: Diagnosis not present

## 2022-11-07 DIAGNOSIS — C9000 Multiple myeloma not having achieved remission: Secondary | ICD-10-CM | POA: Diagnosis not present

## 2022-11-07 DIAGNOSIS — R569 Unspecified convulsions: Secondary | ICD-10-CM | POA: Diagnosis not present

## 2022-11-07 DIAGNOSIS — T451X5A Adverse effect of antineoplastic and immunosuppressive drugs, initial encounter: Secondary | ICD-10-CM | POA: Diagnosis not present

## 2022-11-07 DIAGNOSIS — R5381 Other malaise: Secondary | ICD-10-CM | POA: Diagnosis not present

## 2022-11-07 DIAGNOSIS — E1122 Type 2 diabetes mellitus with diabetic chronic kidney disease: Secondary | ICD-10-CM | POA: Diagnosis not present

## 2022-11-07 DIAGNOSIS — G620 Drug-induced polyneuropathy: Secondary | ICD-10-CM | POA: Diagnosis not present

## 2022-11-07 DIAGNOSIS — Z794 Long term (current) use of insulin: Secondary | ICD-10-CM | POA: Diagnosis not present

## 2022-11-07 DIAGNOSIS — Z66 Do not resuscitate: Secondary | ICD-10-CM | POA: Diagnosis not present

## 2022-11-07 DIAGNOSIS — C9001 Multiple myeloma in remission: Secondary | ICD-10-CM | POA: Diagnosis not present

## 2022-11-07 DIAGNOSIS — B9620 Unspecified Escherichia coli [E. coli] as the cause of diseases classified elsewhere: Secondary | ICD-10-CM | POA: Diagnosis not present

## 2022-11-07 DIAGNOSIS — R55 Syncope and collapse: Secondary | ICD-10-CM | POA: Diagnosis not present

## 2022-11-07 DIAGNOSIS — I959 Hypotension, unspecified: Secondary | ICD-10-CM | POA: Diagnosis not present

## 2022-11-07 DIAGNOSIS — I129 Hypertensive chronic kidney disease with stage 1 through stage 4 chronic kidney disease, or unspecified chronic kidney disease: Secondary | ICD-10-CM | POA: Diagnosis not present

## 2022-11-07 DIAGNOSIS — E1142 Type 2 diabetes mellitus with diabetic polyneuropathy: Secondary | ICD-10-CM | POA: Diagnosis not present

## 2022-11-07 DIAGNOSIS — N390 Urinary tract infection, site not specified: Secondary | ICD-10-CM | POA: Diagnosis not present

## 2022-11-07 DIAGNOSIS — I951 Orthostatic hypotension: Secondary | ICD-10-CM | POA: Diagnosis not present

## 2022-11-08 DIAGNOSIS — R55 Syncope and collapse: Secondary | ICD-10-CM | POA: Diagnosis not present

## 2022-11-08 DIAGNOSIS — C9001 Multiple myeloma in remission: Secondary | ICD-10-CM | POA: Diagnosis not present

## 2022-11-08 DIAGNOSIS — E1122 Type 2 diabetes mellitus with diabetic chronic kidney disease: Secondary | ICD-10-CM | POA: Diagnosis not present

## 2022-11-08 DIAGNOSIS — G620 Drug-induced polyneuropathy: Secondary | ICD-10-CM | POA: Diagnosis not present

## 2022-11-08 DIAGNOSIS — I129 Hypertensive chronic kidney disease with stage 1 through stage 4 chronic kidney disease, or unspecified chronic kidney disease: Secondary | ICD-10-CM | POA: Diagnosis not present

## 2022-11-08 DIAGNOSIS — I951 Orthostatic hypotension: Secondary | ICD-10-CM | POA: Diagnosis not present

## 2022-11-08 DIAGNOSIS — T451X5A Adverse effect of antineoplastic and immunosuppressive drugs, initial encounter: Secondary | ICD-10-CM | POA: Diagnosis not present

## 2022-11-08 DIAGNOSIS — R5381 Other malaise: Secondary | ICD-10-CM | POA: Diagnosis not present

## 2022-11-08 DIAGNOSIS — E1142 Type 2 diabetes mellitus with diabetic polyneuropathy: Secondary | ICD-10-CM | POA: Diagnosis not present

## 2022-11-08 DIAGNOSIS — C9000 Multiple myeloma not having achieved remission: Secondary | ICD-10-CM | POA: Diagnosis not present

## 2022-11-08 DIAGNOSIS — I959 Hypotension, unspecified: Secondary | ICD-10-CM | POA: Diagnosis not present

## 2022-11-08 DIAGNOSIS — Z794 Long term (current) use of insulin: Secondary | ICD-10-CM | POA: Diagnosis not present

## 2022-11-08 DIAGNOSIS — B9620 Unspecified Escherichia coli [E. coli] as the cause of diseases classified elsewhere: Secondary | ICD-10-CM | POA: Diagnosis not present

## 2022-11-08 DIAGNOSIS — Z66 Do not resuscitate: Secondary | ICD-10-CM | POA: Diagnosis not present

## 2022-11-08 DIAGNOSIS — N390 Urinary tract infection, site not specified: Secondary | ICD-10-CM | POA: Diagnosis not present

## 2022-11-09 DIAGNOSIS — R55 Syncope and collapse: Secondary | ICD-10-CM | POA: Diagnosis not present

## 2022-11-09 DIAGNOSIS — G620 Drug-induced polyneuropathy: Secondary | ICD-10-CM | POA: Diagnosis not present

## 2022-11-09 DIAGNOSIS — I959 Hypotension, unspecified: Secondary | ICD-10-CM | POA: Diagnosis not present

## 2022-11-09 DIAGNOSIS — E1142 Type 2 diabetes mellitus with diabetic polyneuropathy: Secondary | ICD-10-CM | POA: Diagnosis not present

## 2022-11-09 DIAGNOSIS — E1122 Type 2 diabetes mellitus with diabetic chronic kidney disease: Secondary | ICD-10-CM | POA: Diagnosis not present

## 2022-11-09 DIAGNOSIS — I129 Hypertensive chronic kidney disease with stage 1 through stage 4 chronic kidney disease, or unspecified chronic kidney disease: Secondary | ICD-10-CM | POA: Diagnosis not present

## 2022-11-09 DIAGNOSIS — N390 Urinary tract infection, site not specified: Secondary | ICD-10-CM | POA: Diagnosis not present

## 2022-11-09 DIAGNOSIS — C9000 Multiple myeloma not having achieved remission: Secondary | ICD-10-CM | POA: Diagnosis not present

## 2022-11-09 DIAGNOSIS — C9001 Multiple myeloma in remission: Secondary | ICD-10-CM | POA: Diagnosis not present

## 2022-11-09 DIAGNOSIS — Z66 Do not resuscitate: Secondary | ICD-10-CM | POA: Diagnosis not present

## 2022-11-09 DIAGNOSIS — T451X5A Adverse effect of antineoplastic and immunosuppressive drugs, initial encounter: Secondary | ICD-10-CM | POA: Diagnosis not present

## 2022-11-09 DIAGNOSIS — R5381 Other malaise: Secondary | ICD-10-CM | POA: Diagnosis not present

## 2022-11-09 DIAGNOSIS — B9620 Unspecified Escherichia coli [E. coli] as the cause of diseases classified elsewhere: Secondary | ICD-10-CM | POA: Diagnosis not present

## 2022-11-09 DIAGNOSIS — I951 Orthostatic hypotension: Secondary | ICD-10-CM | POA: Diagnosis not present

## 2022-11-09 DIAGNOSIS — Z794 Long term (current) use of insulin: Secondary | ICD-10-CM | POA: Diagnosis not present

## 2022-11-10 DIAGNOSIS — I129 Hypertensive chronic kidney disease with stage 1 through stage 4 chronic kidney disease, or unspecified chronic kidney disease: Secondary | ICD-10-CM | POA: Diagnosis not present

## 2022-11-10 DIAGNOSIS — I959 Hypotension, unspecified: Secondary | ICD-10-CM | POA: Diagnosis not present

## 2022-11-10 DIAGNOSIS — Z794 Long term (current) use of insulin: Secondary | ICD-10-CM | POA: Diagnosis not present

## 2022-11-10 DIAGNOSIS — B9620 Unspecified Escherichia coli [E. coli] as the cause of diseases classified elsewhere: Secondary | ICD-10-CM | POA: Diagnosis not present

## 2022-11-10 DIAGNOSIS — C9000 Multiple myeloma not having achieved remission: Secondary | ICD-10-CM | POA: Diagnosis not present

## 2022-11-10 DIAGNOSIS — I951 Orthostatic hypotension: Secondary | ICD-10-CM | POA: Diagnosis not present

## 2022-11-10 DIAGNOSIS — G909 Disorder of the autonomic nervous system, unspecified: Secondary | ICD-10-CM | POA: Diagnosis not present

## 2022-11-10 DIAGNOSIS — E1122 Type 2 diabetes mellitus with diabetic chronic kidney disease: Secondary | ICD-10-CM | POA: Diagnosis not present

## 2022-11-10 DIAGNOSIS — N390 Urinary tract infection, site not specified: Secondary | ICD-10-CM | POA: Diagnosis not present

## 2022-11-10 DIAGNOSIS — G620 Drug-induced polyneuropathy: Secondary | ICD-10-CM | POA: Diagnosis not present

## 2022-11-10 DIAGNOSIS — R55 Syncope and collapse: Secondary | ICD-10-CM | POA: Diagnosis not present

## 2022-11-10 DIAGNOSIS — C9001 Multiple myeloma in remission: Secondary | ICD-10-CM | POA: Diagnosis not present

## 2022-11-10 DIAGNOSIS — E1142 Type 2 diabetes mellitus with diabetic polyneuropathy: Secondary | ICD-10-CM | POA: Diagnosis not present

## 2022-11-10 DIAGNOSIS — T451X5A Adverse effect of antineoplastic and immunosuppressive drugs, initial encounter: Secondary | ICD-10-CM | POA: Diagnosis not present

## 2022-11-10 DIAGNOSIS — Z66 Do not resuscitate: Secondary | ICD-10-CM | POA: Diagnosis not present

## 2022-11-12 ENCOUNTER — Telehealth: Payer: Self-pay

## 2022-11-12 NOTE — Transitions of Care (Post Inpatient/ED Visit) (Signed)
11/12/2022  Name: [name]. MRN: [id number] DOB: [birth year]  Today's TOC FU Call Status: Today's TOC FU Call Status:: Successful TOC FU Call Competed TOC FU Call Complete Date: 11/12/22  Transition Care Management Follow-up Telephone Call Date of Discharge: 11/10/22 Discharge Facility: Other [facility]) Name of Other (Non[facility]) Discharge Facility: [facility] Type of Discharge: Inpatient Admission Primary Inpatient Discharge Diagnosis:: Syncope How have you been since you were released from the hospital?: Better Any questions or concerns?: No  Items Reviewed: Did you receive and understand the discharge instructions provided?: Yes Medications obtained,verified, and reconciled?: No Medications Not Reviewed Reasons:: Other: (Patient was asleep, spouse could not review all medications) Any new allergies since your discharge?: No Dietary orders reviewed?: No Do you have support at home?: Yes People in Home: spouse Name of Support/Comfort Primary Source: Victorino Dike  Medications Reviewed Today: Medications Reviewed Today     Reviewed by Jodelle Gross, RN (Case Manager) on 11/12/22 at 1118  Med List Status: <None>   Medication Order Taking? Sig Documenting Provider Last Dose Status Informant  amiodarone (PACERONE) 200 MG tablet [id number] No Take 1 tablet (200 mg total) by mouth daily. Dettinger, Elige Radon, MD Unknown Active            Med Note Electa Sniff, [facility]   Thu Nov 12, 2022 11:08 AM) Unable to do [facility] med reconciliation  apixaban (ELIQUIS) 5 MG TABS tablet [id number]  Take 1 tablet (5 mg total) by mouth 2 (two) times daily. Dettinger, Elige Radon, MD  Active   atorvastatin (LIPITOR) 20 MG tablet [id number]  TAKE 1 TABLET EVERY DAY Dettinger, Elige Radon, MD  Active   Continuous Blood Gluc Sensor (DEXCOM G7 SENSOR) MISC [id number]  by Does not apply route. Use to test blood sugar continuously. Change sensor on arm every 10 days as directed DX: E11.65. Fills  at [facility] per [employer], Historical, MD  Active            Med Note Cresenciano Genre, JULIE D   Wed Oct 22, 2021 10:50 AM) [facility] medical via parachute portal  diclofenac Sodium (VOLTAREN) 1 % GEL [id number]  Apply topically 4 (four) times daily. [provider]  Active   Dulaglutide (TRULICITY) 1.5 MG/0.5ML SOPN [id number]  Inject 1.5 mg into the skin once a week. DX: E11.65 Dettinger, Elige Radon, MD  Active   DULoxetine (CYMBALTA) 30 MG capsule [id number]  Take 1 capsule (30 mg total) by mouth daily. Dettinger, Elige Radon, MD  Active   [facility] ULTICARE PEN NEEDLES 32G X 6 MM MISC [id number]  EVERY DAY Dettinger, Elige Radon, MD  Active   Insulin Glargine [facility] KWIKPEN) 100 UNIT/ML [id number]  Inject 30-40 Units into the skin daily. Dettinger, Elige Radon, MD  Active   insulin lispro (HUMALOG KWIKPEN) 100 UNIT/ML KwikPen [id number]  Inject 6-20 Units into the skin 3 (three) times daily with meals. Dettinger, Elige Radon, MD  Active   Melatonin 10 MG TBCR [id number]  Take by mouth at bedtime. [provider]  Active   midodrine (PROAMATINE) 5 MG tablet [id number]  TAKE 1 TABLET BY MOUTH 3 TIMES DAILY WITH MEALS. Dettinger, Elige Radon, MD  Active   nitroGLYCERIN (NITROSTAT) 0.4 MG SL tablet [id number]  Place 0.4 mg under the tongue every 5 (five) minutes as needed for chest pain. [provider]  Active   oxyCODONE-acetaminophen (PERCOCET) 10-325 MG tablet [id number]  Take 1 tablet by mouth 2 (two) times daily as needed for pain. Dettinger, Elige Radon,  MD  Active   oxyCODONE-acetaminophen (PERCOCET) 10-325 MG tablet [id number]  Take 1 tablet by mouth 2 (two) times daily as needed for pain. Dettinger, Elige Radon, MD  Active   oxyCODONE-acetaminophen (PERCOCET) 10-325 MG tablet [id number]  Take 1 tablet by mouth 2 (two) times daily as needed for pain. Dettinger, Elige Radon, MD  Active   polyethylene glycol (MIRALAX / GLYCOLAX) 17 g packet [id number]  Take 1 packet by mouth daily. [provider]   Active   Potassium Chloride ER 20 MEQ TBCR [id number]  Take 1 tablet by mouth in the morning and at bedtime. [provider]  Active            Med Note (   Tue Oct 13, 2022 11:02 AM) 7 days  senna-docusate (SENOKOT-S) 8.6-50 MG tablet [id number]  Take 2 tablets by mouth in the morning and at bedtime. [provider]  Active   sulfamethoxazole-trimethoprim (BACTRIM DS) 800-160 MG tablet [id number]  Take 1 tablet by mouth 2 (two) times daily. Bennie Pierini, FNP  Active             Home Care and Equipment/Supplies: Were Home Health Services Ordered?: No Any new equipment or medical supplies ordered?: No  Functional Questionnaire: Do you need assistance with bathing/showering or dressing?: Yes Do you need assistance with meal preparation?: No Do you need assistance with eating?: No Do you have difficulty maintaining continence: No Do you need assistance with getting out of bed/getting out of a chair/moving?: No Do you have difficulty managing or taking your medications?: No  Follow up appointments reviewed: PCP Follow-up appointment confirmed?: Yes Date of PCP follow-up appointment?: 11/26/22 Follow-up Provider: Dr. Louanne Skye Specialist [facility] Follow-up appointment confirmed?: Yes Date of Specialist follow-up appointment?: 11/27/22 Follow-Up Specialty Provider:: Cardiology, MRI Do you need transportation to your follow-up appointment?: No Do you understand care options if your condition(s) worsen?: Yes-patient verbalized understanding  SDOH Interventions Today    Flowsheet Row Most Recent Value  SDOH Interventions   Food Insecurity Interventions Intervention Not Indicated  Housing Interventions Intervention Not Indicated     Jodelle Gross, RN, BSN, CCM Care Management Coordinator [facility] Phone: [phone]/Fax: [phone]

## 2022-11-16 ENCOUNTER — Encounter (INDEPENDENT_AMBULATORY_CARE_PROVIDER_SITE_OTHER): Payer: Medicare PPO | Admitting: Cardiology

## 2022-11-16 ENCOUNTER — Ambulatory Visit: Payer: Medicare PPO | Attending: Internal Medicine

## 2022-11-16 DIAGNOSIS — E1122 Type 2 diabetes mellitus with diabetic chronic kidney disease: Secondary | ICD-10-CM | POA: Diagnosis not present

## 2022-11-16 DIAGNOSIS — Z794 Long term (current) use of insulin: Secondary | ICD-10-CM | POA: Diagnosis not present

## 2022-11-16 DIAGNOSIS — I129 Hypertensive chronic kidney disease with stage 1 through stage 4 chronic kidney disease, or unspecified chronic kidney disease: Secondary | ICD-10-CM | POA: Diagnosis not present

## 2022-11-16 DIAGNOSIS — N189 Chronic kidney disease, unspecified: Secondary | ICD-10-CM | POA: Diagnosis not present

## 2022-11-16 DIAGNOSIS — G4733 Obstructive sleep apnea (adult) (pediatric): Secondary | ICD-10-CM | POA: Diagnosis not present

## 2022-11-16 DIAGNOSIS — Z79899 Other long term (current) drug therapy: Secondary | ICD-10-CM | POA: Diagnosis not present

## 2022-11-16 DIAGNOSIS — G4730 Sleep apnea, unspecified: Secondary | ICD-10-CM

## 2022-11-16 DIAGNOSIS — Z5111 Encounter for antineoplastic chemotherapy: Secondary | ICD-10-CM | POA: Diagnosis not present

## 2022-11-16 DIAGNOSIS — C9000 Multiple myeloma not having achieved remission: Secondary | ICD-10-CM | POA: Diagnosis not present

## 2022-11-16 DIAGNOSIS — M1990 Unspecified osteoarthritis, unspecified site: Secondary | ICD-10-CM | POA: Diagnosis not present

## 2022-11-16 DIAGNOSIS — C9030 Solitary plasmacytoma not having achieved remission: Secondary | ICD-10-CM | POA: Diagnosis not present

## 2022-11-19 NOTE — Procedures (Signed)
SLEEP STUDY REPORT Patient Information Study Date: 11/16/2022 Patient Name: [name] Patient ID: [id number] Birth Date: 02/27/1956 Age: 67 Gender: Male BMI: 44.5 (W=251 lb, H=5' 3'') Referring Physician: Luane School, MD  TEST DESCRIPTION: Home sleep apnea testing was completed using the WatchPat, a Type 1 device, utilizing peripheral arterial tonometry (PAT), chest movement, actigraphy, pulse oximetry, pulse rate, body position and snore. AHI was calculated with apnea and hypopnea using valid sleep time as the denominator. RDI includes apneas, hypopneas, and RERAs. The data acquired and the scoring of sleep and all associated events were performed in accordance with the recommended standards and specifications as outlined in the AASM Manual for the Scoring of Sleep and Associated Events 2.2.0 (2015).   FINDINGS:   1. Mild Obstructive Sleep Apnea with AHI 5.7/hr.   2. No Central Sleep Apnea with pAHIc 0/hr.   3. Oxygen desaturations as low as 53%.   4. Mild snoring was present. O2 sats were < 88% for 13.2 min.   5. Total sleep time was 9 hrs and 27 min.   6. 16.6% of total sleep time was spent in REM sleep.   7. Shortened sleep onset latency at 5 min.   8. Prolonged REM sleep onset latency at 118 min.   9. Total awakenings were 2.  10. Arrhythmia detection:  None  DIAGNOSIS: Mild Obstructive Sleep Apnea (G47.33) Nocturnal Hypoxemia  RECOMMENDATIONS:   1.  Clinical correlation of these findings is necessary.  The decision to treat obstructive sleep apnea (OSA) is usually based on the presence of apnea symptoms or the presence of associated medical conditions such as Hypertension, Congestive Heart Failure, Atrial Fibrillation or Obesity.  The most common symptoms of OSA are snoring, gasping for breath while sleeping, daytime sleepiness and fatigue.   2.  Initiating apnea therapy is recommended given the presence of symptoms and/or associated conditions. Recommend  proceeding with one of the following:     a.  Auto-CPAP therapy with a pressure range of 5-20cm H2O.     b.  An oral appliance (OA) that can be obtained from certain dentists with expertise in sleep medicine.  These are primarily of use in non-obese patients with mild and moderate disease.     c.  An ENT consultation which may be useful to look for specific causes of obstruction and possible treatment options.     d.  If patient is intolerant to PAP therapy, consider referral to ENT for evaluation for hypoglossal nerve stimulator.   3.  Close follow-up is necessary to ensure success with CPAP or oral appliance therapy for maximum benefit.  4.  A follow-up oximetry study on CPAP is recommended to assess the adequacy of therapy and determine the need for supplemental oxygen or the potential need for Bi-level therapy.  An arterial blood gas to determine the adequacy of baseline ventilation and oxygenation should also be considered.  5.  Healthy sleep recommendations include:  adequate nightly sleep (normal 7-9 hrs/night), avoidance of caffeine after noon and alcohol near bedtime, and maintaining a sleep environment that is cool, dark and quiet.  6.  Weight loss for overweight patients is recommended.  Even modest amounts of weight loss can significantly improve the severity of sleep apnea.  7.  Snoring recommendations include:  weight loss where appropriate, side sleeping, and avoidance of alcohol before bed.  8.  Operation of motor vehicle should be avoided when sleepy.  Signature: Armanda Magic, MD; [facility]; Diplomat, American Board of Sleep Medicine  Electronically Signed: 11/19/2022 4:15:35 PM

## 2022-11-25 ENCOUNTER — Other Ambulatory Visit: Payer: Self-pay | Admitting: Family Medicine

## 2022-11-27 DIAGNOSIS — C9000 Multiple myeloma not having achieved remission: Secondary | ICD-10-CM | POA: Diagnosis not present

## 2022-12-02 ENCOUNTER — Encounter: Payer: Self-pay | Admitting: Family Medicine

## 2022-12-02 ENCOUNTER — Telehealth (INDEPENDENT_AMBULATORY_CARE_PROVIDER_SITE_OTHER): Payer: Medicare PPO | Admitting: Family Medicine

## 2022-12-02 DIAGNOSIS — M5442 Lumbago with sciatica, left side: Secondary | ICD-10-CM

## 2022-12-02 DIAGNOSIS — M8448XA Pathological fracture, other site, initial encounter for fracture: Secondary | ICD-10-CM | POA: Diagnosis not present

## 2022-12-02 DIAGNOSIS — M5441 Lumbago with sciatica, right side: Secondary | ICD-10-CM

## 2022-12-02 DIAGNOSIS — I950 Idiopathic hypotension: Secondary | ICD-10-CM

## 2022-12-02 DIAGNOSIS — E1149 Type 2 diabetes mellitus with other diabetic neurological complication: Secondary | ICD-10-CM | POA: Diagnosis not present

## 2022-12-02 DIAGNOSIS — Z79899 Other long term (current) drug therapy: Secondary | ICD-10-CM

## 2022-12-02 DIAGNOSIS — Z9189 Other specified personal risk factors, not elsewhere classified: Secondary | ICD-10-CM

## 2022-12-02 DIAGNOSIS — M8448XS Pathological fracture, other site, sequela: Secondary | ICD-10-CM

## 2022-12-02 DIAGNOSIS — C9000 Multiple myeloma not having achieved remission: Secondary | ICD-10-CM | POA: Diagnosis not present

## 2022-12-02 DIAGNOSIS — E1159 Type 2 diabetes mellitus with other circulatory complications: Secondary | ICD-10-CM

## 2022-12-02 DIAGNOSIS — I152 Hypertension secondary to endocrine disorders: Secondary | ICD-10-CM

## 2022-12-02 DIAGNOSIS — G4734 Idiopathic sleep related nonobstructive alveolar hypoventilation: Secondary | ICD-10-CM

## 2022-12-02 MED ORDER — APIXABAN 5 MG PO TABS: 5.0000 mg | ORAL_TABLET | Freq: Two times a day (BID) | ORAL | 3 refills | Status: DC

## 2022-12-02 MED ORDER — OXYCODONE-ACETAMINOPHEN 10-325 MG PO TABS
1.0000 | ORAL_TABLET | Freq: Two times a day (BID) | ORAL | 0 refills | Status: DC | PRN
Start: 2023-01-01 — End: 2023-03-01

## 2022-12-02 MED ORDER — OXYCODONE-ACETAMINOPHEN 10-325 MG PO TABS
1.0000 | ORAL_TABLET | Freq: Two times a day (BID) | ORAL | 0 refills | Status: DC | PRN
Start: 2023-01-31 — End: 2023-03-01

## 2022-12-02 MED ORDER — ATORVASTATIN CALCIUM 20 MG PO TABS
20.0000 mg | ORAL_TABLET | Freq: Every day | ORAL | 3 refills | Status: DC
Start: 2022-12-02 — End: 2023-04-30

## 2022-12-02 MED ORDER — AMIODARONE HCL 200 MG PO TABS: 200.0000 mg | ORAL_TABLET | Freq: Every day | ORAL | 3 refills | Status: DC

## 2022-12-02 MED ORDER — DULOXETINE HCL 30 MG PO CPEP: 30.0000 mg | ORAL_CAPSULE | Freq: Every day | ORAL | 3 refills | Status: DC

## 2022-12-02 MED ORDER — OXYCODONE-ACETAMINOPHEN 10-325 MG PO TABS
1.0000 | ORAL_TABLET | Freq: Two times a day (BID) | ORAL | 0 refills | Status: DC | PRN
Start: 2022-12-02 — End: 2023-03-01

## 2022-12-02 NOTE — Progress Notes (Signed)
Virtual Visit via MyChart video note  I connected with [name]. on 12/02/22 at 1425 by video and verified that I am speaking with the correct person using two identifiers. [name]. is currently located at home and patient and wife are currently with her during visit. The provider, Elige Radon , MD is located in their office at time of visit.  Call ended at 1448  I discussed the limitations, risks, security and privacy concerns of performing an evaluation and management service by video and the availability of in person appointments. I also discussed with the patient that there may be a patient responsible charge related to this service. The patient expressed understanding and agreed to proceed.   History and Present Illness: Sleep apnea He had a sleep study that showed nocturnal hypoxia and sleep apnea. They are wanting a prescription for nocturnal oxygen. They have not heard from the cardiologist that ordered. They have not seen them for apnea treatment.   Type 2 diabetes mellitus Patient comes in today for recheck of his diabetes. Patient has been currently taking dexcom and trulicity and humalog. Patient is not currently on an ACE inhibitor/ARB. Patient has not seen an ophthalmologist this year. Patient denies any new issues with their feet. The symptom started onset as an adult apnea and afib ARE RELATED TO DM   Pain assessment: Cause of pain- lower back and each side between shoulders from myeloma lesions Pain location-lower back and upper back and anywhere where the myeloma was metastasized Pain on scale of 1-10- 7 Frequency-daily What increases pain-certain movements or if something bumps one of his cancer spots What makes pain Better-medication Effects on ADL -mostly bedbound or wheelchair-bound, Any change in general medical condition-still fighting chemo and cancer.  Current opioids rx-oxycodone 10-3 25 twice daily. # meds  rx-60/month Effectiveness of current meds-works well Adverse reactions from pain meds-none Morphine equivalent-30  Pill count performed-No Last drug screen -12/26/2021 ( high risk q54m, moderate risk q29m, low risk yearly ) Urine drug screen today- No Was the NCCSR reviewed-yes  If yes were their any concerning findings? -He did get a prescription from another provider and we discussed that in the future he needs to make sure he gets it from [place]. Pain contract signed on: 12/26/2021    Outpatient Encounter Medications as of 12/02/2022  Medication Sig   midodrine (PROAMATINE) 10 MG tablet Take 10 mg by mouth 3 (three) times daily.   amiodarone (PACERONE) 200 MG tablet Take 1 tablet (200 mg total) by mouth daily.   apixaban (ELIQUIS) 5 MG TABS tablet Take 1 tablet (5 mg total) by mouth 2 (two) times daily.   atorvastatin (LIPITOR) 20 MG tablet Take 1 tablet (20 mg total) by mouth daily.   Continuous Blood Gluc Sensor (DEXCOM G7 SENSOR) MISC by Does not apply route. Use to test blood sugar continuously. Change sensor on arm every 10 days as directed DX: E11.65. Fills at [facility] per medicare contracts   diclofenac Sodium (VOLTAREN) 1 % GEL Apply topically 4 (four) times daily.   Dulaglutide (TRULICITY) 1.5 MG/0.5ML SOPN Inject 1.5 mg into the skin once a week. DX: E11.65   DULoxetine (CYMBALTA) 30 MG capsule Take 1 capsule (30 mg total) by mouth daily.   GNP ULTICARE PEN NEEDLES 32G X 6 MM MISC EVERY DAY   Insulin Glargine (BASAGLAR KWIKPEN) 100 UNIT/ML Inject 30-40 Units into the skin daily.   insulin lispro (HUMALOG KWIKPEN) 100 UNIT/ML KwikPen Inject 6-20 Units  into the skin 3 (three) times daily with meals.   Melatonin 10 MG TBCR Take by mouth at bedtime.   nitroGLYCERIN (NITROSTAT) 0.4 MG SL tablet Place 0.4 mg under the tongue every 5 (five) minutes as needed for chest pain.   [START ON 01/01/2023] oxyCODONE-acetaminophen (PERCOCET) 10-325 MG tablet Take 1 tablet by mouth 2 (two) times daily as  needed for pain.   [START ON 01/31/2023] oxyCODONE-acetaminophen (PERCOCET) 10-325 MG tablet Take 1 tablet by mouth 2 (two) times daily as needed for pain.   oxyCODONE-acetaminophen (PERCOCET) 10-325 MG tablet Take 1 tablet by mouth 2 (two) times daily as needed for pain.   polyethylene glycol (MIRALAX / GLYCOLAX) 17 g packet Take 1 packet by mouth daily.   Potassium Chloride ER 20 MEQ TBCR Take 1 tablet by mouth in the morning and at bedtime.   senna-docusate (SENOKOT-S) 8.6-50 MG tablet Take 2 tablets by mouth in the morning and at bedtime.   [DISCONTINUED] amiodarone (PACERONE) 200 MG tablet TAKE 1 TABLET EVERY DAY   [DISCONTINUED] apixaban (ELIQUIS) 5 MG TABS tablet TAKE 1 TABLET TWICE DAILY   [DISCONTINUED] atorvastatin (LIPITOR) 20 MG tablet TAKE 1 TABLET EVERY DAY   [DISCONTINUED] DULoxetine (CYMBALTA) 30 MG capsule TAKE 1 CAPSULE EVERY DAY   [DISCONTINUED] midodrine (PROAMATINE) 5 MG tablet TAKE 1 TABLET BY MOUTH 3 TIMES DAILY WITH MEALS.   [DISCONTINUED] oxyCODONE-acetaminophen (PERCOCET) 10-325 MG tablet Take 1 tablet by mouth 2 (two) times daily as needed for pain.   [DISCONTINUED] oxyCODONE-acetaminophen (PERCOCET) 10-325 MG tablet Take 1 tablet by mouth 2 (two) times daily as needed for pain.   [DISCONTINUED] oxyCODONE-acetaminophen (PERCOCET) 10-325 MG tablet Take 1 tablet by mouth 2 (two) times daily as needed for pain.   [DISCONTINUED] sulfamethoxazole-trimethoprim (BACTRIM DS) 800-160 MG tablet Take 1 tablet by mouth 2 (two) times daily.   No facility-administered encounter medications on file as of 12/02/2022.    Review of Systems  Constitutional:  Negative for chills and fever.  Eyes:  Negative for visual disturbance.  Respiratory:  Negative for shortness of breath and wheezing.   Cardiovascular:  Negative for chest pain and leg swelling.  Musculoskeletal:  Positive for arthralgias, back pain, gait problem and myalgias.  Skin:  Negative for rash.  Neurological:  Negative for  dizziness, light-headedness and headaches.  Psychiatric/Behavioral:  Negative for decreased concentration, dysphoric mood and self-injury. The patient is not nervous/anxious.   All other systems reviewed and are negative.   Observations/Objective: Patient is mostly wheelchair and bedbound and would benefit greatly from electric wheelchair.   Assessment and Plan: Problem List Items Addressed This Visit       Cardiovascular and Mediastinum   Hypertension associated with diabetes (HCC)   Relevant Medications   midodrine (PROAMATINE) 10 MG tablet   amiodarone (PACERONE) 200 MG tablet   apixaban (ELIQUIS) 5 MG TABS tablet   atorvastatin (LIPITOR) 20 MG tablet     Endocrine   Type 2 diabetes mellitus with neurological complications (HCC) (Chronic)   Relevant Medications   atorvastatin (LIPITOR) 20 MG tablet     Musculoskeletal and Integument   Pathologic fracture   Relevant Medications   oxyCODONE-acetaminophen (PERCOCET) 10-325 MG tablet (Start on 01/01/2023)   oxyCODONE-acetaminophen (PERCOCET) 10-325 MG tablet (Start on 01/31/2023)   oxyCODONE-acetaminophen (PERCOCET) 10-325 MG tablet   Other Relevant Orders   For home use only DME Other see comment   For home use only DME Other see comment     Other   Multiple myeloma (  HCC) (Chronic)   Relevant Medications   oxyCODONE-acetaminophen (PERCOCET) 10-325 MG tablet (Start on 01/01/2023)   oxyCODONE-acetaminophen (PERCOCET) 10-325 MG tablet (Start on 01/31/2023)   oxyCODONE-acetaminophen (PERCOCET) 10-325 MG tablet   Other Relevant Orders   For home use only DME Other see comment   [place] cardiac risk >20% in next 10 years   Relevant Medications   atorvastatin (LIPITOR) 20 MG tablet   Low back pain   Relevant Medications   oxyCODONE-acetaminophen (PERCOCET) 10-325 MG tablet (Start on 01/01/2023)   oxyCODONE-acetaminophen (PERCOCET) 10-325 MG tablet (Start on 01/31/2023)   oxyCODONE-acetaminophen (PERCOCET) 10-325 MG tablet    Other Relevant Orders   For home use only DME Other see comment   Other Visit Diagnoses     Idiopathic hypotension    -  Primary   Relevant Medications   midodrine (PROAMATINE) 10 MG tablet   amiodarone (PACERONE) 200 MG tablet   apixaban (ELIQUIS) 5 MG TABS tablet   atorvastatin (LIPITOR) 20 MG tablet   Controlled substance agreement signed       Relevant Medications   oxyCODONE-acetaminophen (PERCOCET) 10-325 MG tablet (Start on 01/01/2023)   oxyCODONE-acetaminophen (PERCOCET) 10-325 MG tablet (Start on 01/31/2023)   oxyCODONE-acetaminophen (PERCOCET) 10-325 MG tablet   Other Relevant Orders   For home use only DME Other see comment   Nocturnal hypoxia       Relevant Orders   For home use only DME oxygen       Sleep study at home showed oxygen dropping to 88%, will prescribe home oxygen 2 to 3 L to be used at nighttime.  Patient is mostly wheelchair or bedbound and because of his pain throughout the multiple cancer sites he has trouble getting out of the house even in a wheelchair and his wife is smaller and has trouble pushing him, he would benefit greatly from an electric scooter to help relieve his house and get out and be able to do things for his own and transportation to and from his chemotherapy visits he is not strong enough to use a walker or cane and realistically not strong enough to use a manual wheelchair on his own in his upper body.    Follow up plan: Return in about 3 months (around 03/04/2023), or if symptoms worsen or fail to improve, for pain recheck and sleep apnea and diabetes.  [facility] [place] home health. Electric scooter.    I discussed the assessment and treatment plan with the patient. The patient was provided an opportunity to ask questions and all were answered. The patient agreed with the plan and demonstrated an understanding of the instructions.   The patient was advised to call back or seek an in-person evaluation if the symptoms worsen or  if the condition fails to improve as anticipated.  The above assessment and management plan was discussed with the patient. The patient verbalized understanding of and has agreed to the management plan. Patient is aware to call the clinic if symptoms persist or worsen. Patient is aware when to return to the clinic for a follow-up visit. Patient educated on when it is appropriate to go to the emergency department.    I provided 23 minutes of non-face-to-face time during this encounter.    Nils Pyle, MD

## 2022-12-03 ENCOUNTER — Telehealth: Payer: Self-pay | Admitting: Family Medicine

## 2022-12-03 NOTE — Telephone Encounter (Signed)
Needs overnight oximetry tested, if he has not had this they need an order to do the overnight oximetry test. If calling back leave message homehealth has no power

## 2022-12-03 NOTE — Telephone Encounter (Signed)
Left message for home health to call back to let Dr. Louanne Skye know what exactly needs to be added to the notes.

## 2022-12-03 NOTE — Telephone Encounter (Signed)
Definitely going for power scooter per the patient, what more documentation to they need, usually they give me an indication of what to put in the visit? Arville Care, MD [facility] 12/03/2022, 9:43 AM

## 2022-12-03 NOTE — Telephone Encounter (Signed)
Need more documentation an if order for power scooter or wheelchair

## 2022-12-04 ENCOUNTER — Telehealth: Payer: Self-pay

## 2022-12-04 DIAGNOSIS — G4730 Sleep apnea, unspecified: Secondary | ICD-10-CM

## 2022-12-04 NOTE — Telephone Encounter (Signed)
Re-faxed with PCP notes and sleep study notes.

## 2022-12-04 NOTE — Telephone Encounter (Signed)
Added more documentation

## 2022-12-04 NOTE — Telephone Encounter (Signed)
Pt aware order for scooter sent to [facility], but we will let Cardiology handle the nocturnal O2 since sleep study is suggesting CPAP

## 2022-12-04 NOTE — Telephone Encounter (Signed)
Sleep Study shows pt needs a CPAP, talks about a CPAP in study. DIscusses Overnight pulse oximetry to see if he needs O2 w/ CPAP. IF only wants nocturnal O2 would need Dx of COPD or CHF w/ order. Are we wanting to order CPAP or which, Please advise

## 2022-12-07 ENCOUNTER — Telehealth: Payer: Self-pay

## 2022-12-07 DIAGNOSIS — G4733 Obstructive sleep apnea (adult) (pediatric): Secondary | ICD-10-CM

## 2022-12-07 NOTE — Telephone Encounter (Signed)
Notified patient's wife, per DPR, of sleep study results and recommendations. Patient's wife prefers his machine and supplies be ordered through [facility] in [place], [place]. She is concerned that he is not stable enough for an overnight visit to our sleep  lab. Patient has been having episode of fainting when he switches positions.

## 2022-12-09 NOTE — Telephone Encounter (Signed)
Patient notified that provider has seen Cardiac MRI and to follow up as needed. Cancelled Oct appointment

## 2022-12-09 NOTE — Telephone Encounter (Signed)
-----   Message from Vishnu P Mallipeddi sent at 12/08/2022  7:19 PM EDT ----- Obtain cardiac MRI per his oncologist's request to definitively r/o cardiac amyloidosis.

## 2022-12-09 NOTE — Telephone Encounter (Signed)
Called and spoke with wife Victorino Dike. Stated that Detrich has already had a Cardiac MRI done at [facility] on 08/02 full report in Care Everywhere. She also stated that he has been in the hospital for passing out spells and will be seeing a Cardiologist on Friday at [facility] for follow up.

## 2022-12-11 DIAGNOSIS — E876 Hypokalemia: Secondary | ICD-10-CM | POA: Diagnosis not present

## 2022-12-11 DIAGNOSIS — C9000 Multiple myeloma not having achieved remission: Secondary | ICD-10-CM | POA: Diagnosis not present

## 2022-12-11 DIAGNOSIS — E271 Primary adrenocortical insufficiency: Secondary | ICD-10-CM | POA: Diagnosis not present

## 2022-12-11 DIAGNOSIS — I951 Orthostatic hypotension: Secondary | ICD-10-CM | POA: Diagnosis not present

## 2022-12-11 DIAGNOSIS — I959 Hypotension, unspecified: Secondary | ICD-10-CM | POA: Diagnosis not present

## 2022-12-14 ENCOUNTER — Telehealth: Payer: Self-pay | Admitting: Internal Medicine

## 2022-12-14 DIAGNOSIS — E1149 Type 2 diabetes mellitus with other diabetic neurological complication: Secondary | ICD-10-CM | POA: Diagnosis not present

## 2022-12-14 NOTE — Telephone Encounter (Signed)
Patient's wife Victorino Dike called stating she spoke with [facility] they told her they haven't received any orders for any CPAP supplies.  She was calling to follow up on that. Please advise.

## 2022-12-16 DIAGNOSIS — E271 Primary adrenocortical insufficiency: Secondary | ICD-10-CM | POA: Diagnosis not present

## 2022-12-16 DIAGNOSIS — C9000 Multiple myeloma not having achieved remission: Secondary | ICD-10-CM | POA: Diagnosis not present

## 2022-12-16 DIAGNOSIS — R0609 Other forms of dyspnea: Secondary | ICD-10-CM | POA: Diagnosis not present

## 2022-12-16 DIAGNOSIS — E860 Dehydration: Secondary | ICD-10-CM | POA: Diagnosis not present

## 2022-12-16 NOTE — Telephone Encounter (Signed)
Patient's daughter is following up. She states [facility] still has not received CPAP order and she would like to speak with someone ASAP because the patient has gone 2 weeks without his machine.

## 2022-12-17 ENCOUNTER — Telehealth: Payer: Self-pay

## 2022-12-17 NOTE — Telephone Encounter (Signed)
Spoke with patient's daughter, per DPR, and notified her that CPAP order was faxed on 12/07/22, 12/16/22, and again this morning 12/17/22 to [facility] in [place], [place]

## 2022-12-17 NOTE — Telephone Encounter (Signed)
Spoke with patient's daughter, per DPR, she gave me correct fax number. Previous orders sent to wrong fax number for [facility] in [place], [place]. Correct number is [phone]

## 2022-12-23 DIAGNOSIS — E860 Dehydration: Secondary | ICD-10-CM | POA: Diagnosis not present

## 2022-12-23 DIAGNOSIS — R0609 Other forms of dyspnea: Secondary | ICD-10-CM | POA: Diagnosis not present

## 2022-12-23 DIAGNOSIS — R42 Dizziness and giddiness: Secondary | ICD-10-CM | POA: Diagnosis not present

## 2022-12-23 DIAGNOSIS — C9000 Multiple myeloma not having achieved remission: Secondary | ICD-10-CM | POA: Diagnosis not present

## 2022-12-25 ENCOUNTER — Telehealth: Payer: Self-pay | Admitting: Family Medicine

## 2022-12-25 ENCOUNTER — Other Ambulatory Visit: Payer: Self-pay | Admitting: Family Medicine

## 2022-12-25 NOTE — Telephone Encounter (Signed)
This is used to raise BP

## 2022-12-25 NOTE — Telephone Encounter (Signed)
[name],  Are you ok refilling Midodrine 10mg  for pt? It has historical provider in med list but Dr. Louanne Skye documented that pt was taking the 10mg  for hypertension on 12/02/22

## 2022-12-29 NOTE — Telephone Encounter (Signed)
Dr Dettinger, can you advise on this? Are you able to refill? Wife calling again.

## 2022-12-29 NOTE — Telephone Encounter (Signed)
Per Eliseo GumBrunetta Genera, CMA   12/17/22    Spoke with patient's daughter, per DPR, she gave me correct fax number. Previous orders sent to wrong fax number for [facility] in [place], [place]. Correct number is [phone]

## 2022-12-30 MED ORDER — MIDODRINE HCL 10 MG PO TABS: 10.0000 mg | ORAL_TABLET | Freq: Three times a day (TID) | ORAL | 1 refills | Status: DC

## 2022-12-30 NOTE — Telephone Encounter (Signed)
Sent new pressure 10 mg tablets

## 2022-12-31 MED ORDER — MIDODRINE HCL 10 MG PO TABS: 10.0000 mg | ORAL_TABLET | Freq: Three times a day (TID) | ORAL | 0 refills | Status: DC

## 2022-12-31 NOTE — Telephone Encounter (Signed)
Spouse says that pt is out of the medication and asking to keep the mail order in process but to send a refill to [facility] [place] until the mail order comes in.

## 2022-12-31 NOTE — Telephone Encounter (Signed)
Aware 30-d supply sent to local [facility]

## 2023-01-01 DIAGNOSIS — C9000 Multiple myeloma not having achieved remission: Secondary | ICD-10-CM | POA: Diagnosis not present

## 2023-01-01 DIAGNOSIS — I951 Orthostatic hypotension: Secondary | ICD-10-CM | POA: Diagnosis not present

## 2023-01-01 DIAGNOSIS — Z79899 Other long term (current) drug therapy: Secondary | ICD-10-CM | POA: Diagnosis not present

## 2023-01-01 DIAGNOSIS — I4891 Unspecified atrial fibrillation: Secondary | ICD-10-CM | POA: Diagnosis not present

## 2023-01-01 DIAGNOSIS — Z23 Encounter for immunization: Secondary | ICD-10-CM | POA: Diagnosis not present

## 2023-01-01 DIAGNOSIS — I119 Hypertensive heart disease without heart failure: Secondary | ICD-10-CM | POA: Diagnosis not present

## 2023-01-01 DIAGNOSIS — E854 Organ-limited amyloidosis: Secondary | ICD-10-CM | POA: Diagnosis not present

## 2023-01-01 DIAGNOSIS — G4730 Sleep apnea, unspecified: Secondary | ICD-10-CM | POA: Diagnosis not present

## 2023-01-08 DIAGNOSIS — C9000 Multiple myeloma not having achieved remission: Secondary | ICD-10-CM | POA: Diagnosis not present

## 2023-01-12 NOTE — Telephone Encounter (Signed)
Entered in Error

## 2023-01-26 ENCOUNTER — Ambulatory Visit: Payer: Medicare PPO | Admitting: Internal Medicine

## 2023-01-29 DIAGNOSIS — G4733 Obstructive sleep apnea (adult) (pediatric): Secondary | ICD-10-CM | POA: Diagnosis not present

## 2023-02-03 DIAGNOSIS — Z5112 Encounter for antineoplastic immunotherapy: Secondary | ICD-10-CM | POA: Diagnosis not present

## 2023-02-03 DIAGNOSIS — S32040A Wedge compression fracture of fourth lumbar vertebra, initial encounter for closed fracture: Secondary | ICD-10-CM | POA: Diagnosis not present

## 2023-02-03 DIAGNOSIS — N189 Chronic kidney disease, unspecified: Secondary | ICD-10-CM | POA: Diagnosis not present

## 2023-02-03 DIAGNOSIS — C9000 Multiple myeloma not having achieved remission: Secondary | ICD-10-CM | POA: Diagnosis not present

## 2023-02-03 DIAGNOSIS — I951 Orthostatic hypotension: Secondary | ICD-10-CM | POA: Diagnosis not present

## 2023-02-03 DIAGNOSIS — I129 Hypertensive chronic kidney disease with stage 1 through stage 4 chronic kidney disease, or unspecified chronic kidney disease: Secondary | ICD-10-CM | POA: Diagnosis not present

## 2023-02-03 DIAGNOSIS — E1142 Type 2 diabetes mellitus with diabetic polyneuropathy: Secondary | ICD-10-CM | POA: Diagnosis not present

## 2023-02-03 DIAGNOSIS — E1122 Type 2 diabetes mellitus with diabetic chronic kidney disease: Secondary | ICD-10-CM | POA: Diagnosis not present

## 2023-02-03 DIAGNOSIS — E271 Primary adrenocortical insufficiency: Secondary | ICD-10-CM | POA: Diagnosis not present

## 2023-02-03 DIAGNOSIS — S32040D Wedge compression fracture of fourth lumbar vertebra, subsequent encounter for fracture with routine healing: Secondary | ICD-10-CM | POA: Diagnosis not present

## 2023-02-03 DIAGNOSIS — R110 Nausea: Secondary | ICD-10-CM | POA: Diagnosis not present

## 2023-02-09 ENCOUNTER — Telehealth: Payer: Self-pay | Admitting: Family Medicine

## 2023-02-09 DIAGNOSIS — I4891 Unspecified atrial fibrillation: Secondary | ICD-10-CM | POA: Diagnosis not present

## 2023-02-09 DIAGNOSIS — I517 Cardiomegaly: Secondary | ICD-10-CM | POA: Diagnosis not present

## 2023-02-09 DIAGNOSIS — C9000 Multiple myeloma not having achieved remission: Secondary | ICD-10-CM | POA: Diagnosis not present

## 2023-02-09 DIAGNOSIS — I951 Orthostatic hypotension: Secondary | ICD-10-CM | POA: Diagnosis not present

## 2023-02-09 MED ORDER — MIDODRINE HCL 10 MG PO TABS: 10.0000 mg | ORAL_TABLET | Freq: Three times a day (TID) | ORAL | 2 refills | Status: DC

## 2023-02-09 NOTE — Telephone Encounter (Signed)
Aware refill sent to pharmacy ?

## 2023-02-12 ENCOUNTER — Telehealth: Payer: Self-pay | Admitting: Family Medicine

## 2023-02-12 NOTE — Telephone Encounter (Signed)
Looks like pt will have enough medication until 11/6. Appt scheduled for 11/4 at 3:55pm. Wife made aware.

## 2023-02-16 DIAGNOSIS — E1122 Type 2 diabetes mellitus with diabetic chronic kidney disease: Secondary | ICD-10-CM | POA: Diagnosis not present

## 2023-02-16 DIAGNOSIS — Z4821 Encounter for aftercare following heart transplant: Secondary | ICD-10-CM | POA: Diagnosis not present

## 2023-02-16 DIAGNOSIS — Z8679 Personal history of other diseases of the circulatory system: Secondary | ICD-10-CM | POA: Diagnosis not present

## 2023-02-16 DIAGNOSIS — C9000 Multiple myeloma not having achieved remission: Secondary | ICD-10-CM | POA: Diagnosis not present

## 2023-02-16 DIAGNOSIS — N189 Chronic kidney disease, unspecified: Secondary | ICD-10-CM | POA: Diagnosis not present

## 2023-02-16 DIAGNOSIS — I951 Orthostatic hypotension: Secondary | ICD-10-CM | POA: Diagnosis not present

## 2023-02-16 DIAGNOSIS — I517 Cardiomegaly: Secondary | ICD-10-CM | POA: Diagnosis not present

## 2023-02-16 DIAGNOSIS — I129 Hypertensive chronic kidney disease with stage 1 through stage 4 chronic kidney disease, or unspecified chronic kidney disease: Secondary | ICD-10-CM | POA: Diagnosis not present

## 2023-03-01 ENCOUNTER — Ambulatory Visit (INDEPENDENT_AMBULATORY_CARE_PROVIDER_SITE_OTHER): Payer: Medicare PPO | Admitting: Family Medicine

## 2023-03-01 VITALS — BP 112/72 | Ht 75.0 in | Wt 231.0 lb

## 2023-03-01 DIAGNOSIS — E785 Hyperlipidemia, unspecified: Secondary | ICD-10-CM

## 2023-03-01 DIAGNOSIS — I152 Hypertension secondary to endocrine disorders: Secondary | ICD-10-CM

## 2023-03-01 DIAGNOSIS — E1159 Type 2 diabetes mellitus with other circulatory complications: Secondary | ICD-10-CM

## 2023-03-01 DIAGNOSIS — M5441 Lumbago with sciatica, right side: Secondary | ICD-10-CM

## 2023-03-01 DIAGNOSIS — E1149 Type 2 diabetes mellitus with other diabetic neurological complication: Secondary | ICD-10-CM

## 2023-03-01 DIAGNOSIS — Z79899 Other long term (current) drug therapy: Secondary | ICD-10-CM | POA: Diagnosis not present

## 2023-03-01 DIAGNOSIS — M5442 Lumbago with sciatica, left side: Secondary | ICD-10-CM

## 2023-03-01 DIAGNOSIS — M8448XA Pathological fracture, other site, initial encounter for fracture: Secondary | ICD-10-CM

## 2023-03-01 DIAGNOSIS — C9000 Multiple myeloma not having achieved remission: Secondary | ICD-10-CM

## 2023-03-01 DIAGNOSIS — G4733 Obstructive sleep apnea (adult) (pediatric): Secondary | ICD-10-CM | POA: Diagnosis not present

## 2023-03-01 DIAGNOSIS — E1169 Type 2 diabetes mellitus with other specified complication: Secondary | ICD-10-CM

## 2023-03-01 MED ORDER — OXYCODONE-ACETAMINOPHEN 10-325 MG PO TABS
1.0000 | ORAL_TABLET | Freq: Three times a day (TID) | ORAL | 0 refills | Status: DC | PRN
Start: 2023-04-29 — End: 2023-04-30

## 2023-03-01 MED ORDER — OXYCODONE-ACETAMINOPHEN 10-325 MG PO TABS
1.0000 | ORAL_TABLET | Freq: Three times a day (TID) | ORAL | 0 refills | Status: DC | PRN
Start: 2023-03-01 — End: 2023-04-30

## 2023-03-01 MED ORDER — OXYCODONE-ACETAMINOPHEN 10-325 MG PO TABS
1.0000 | ORAL_TABLET | Freq: Three times a day (TID) | ORAL | 0 refills | Status: DC | PRN
Start: 2023-03-30 — End: 2023-04-30

## 2023-03-01 MED ORDER — BOOST PO LIQD: 237.0000 mL | ORAL | 3 refills | Status: DC

## 2023-03-01 NOTE — Progress Notes (Signed)
BP 112/72   Ht 6\' 3"  (1.905 m)   Wt 231 lb (104.8 kg)   SpO2 98%   BMI 28.87 kg/m    Subjective:   Patient ID: Ethan Hacker., male    DOB: 13-Jul-1955, 67 y.o.   MRN: [id number]  HPI: Ethan Raulston. is a 68 y.o. male presenting on 03/01/2023 for Medical Management of Chronic Issues   HPI Pain assessment: Cause of pain-low back pain and down his legs.  Secondary to multiply myeloma causing a degenerative lesion in his spine Pain location-lumbar and lower legs Pain on scale of 1-10-8 Frequency-daily What increases pain-sitting or standing or transferring What makes pain Better-medication and laying down Effects on ADL -very limited, mostly wheelchair-bound or bedbound Any change in general medical condition-on chemotherapy for multiple myeloma  Current opioids rx-oxycodone 10-3 25 every 8 hours as needed # meds rx-90/month Effectiveness of current meds-was at 60/month and it was not lasting through the day so we increased it to 90 or 3 times a day Adverse reactions from pain meds-none Morphine equivalent-45  Pill count performed-No Last drug screen -12/26/2021 ( high risk q39m, moderate risk q68m, low risk yearly ) Urine drug screen today- Yes Was the N[facility] reviewed-yes  If yes were their any concerning findings? -None Pain contract signed on: Today  Type 2 diabetes mellitus Patient comes in today for recheck of his diabetes. Patient has been currently taking Basaglar 33 units daily and Humalog 3 to 4 units 3 times daily with meals and Trulicity. Patient is not currently on an ACE inhibitor/ARB. Patient has not seen an ophthalmologist this year.  Patient has mostly numbness in his feet but denies any sores. The symptom started onset as an adult neuropathy and hypertension and hyperlipidemia ARE RELATED TO DM   Hypertension/now hypotension Patient is currently on amiodarone and midodrine, and their blood pressure today is 112/72. Patient denies any lightheadedness or  dizziness. Patient denies headaches, blurred vision, chest pains, shortness of breath, or weakness. Denies any side effects from medication and is content with current medication.   Hyperlipidemia Patient is coming in for recheck of his hyperlipidemia. The patient is currently taking atorvastatin. They deny any issues with myalgias or history of liver damage from it. They deny any focal numbness or weakness or chest pain.   Relevant past medical, surgical, family and social history reviewed and updated as indicated. Interim medical history since our last visit reviewed. Allergies and medications reviewed and updated.  Review of Systems  Constitutional:  Negative for chills and fever.  Eyes:  Negative for visual disturbance.  Respiratory:  Negative for shortness of breath and wheezing.   Cardiovascular:  Negative for chest pain and leg swelling.  Musculoskeletal:  Positive for arthralgias, back pain and gait problem.  Skin:  Negative for rash.  Neurological:  Negative for dizziness, weakness and light-headedness.  All other systems reviewed and are negative.   Per HPI unless specifically indicated above   Allergies as of 03/01/2023       Reactions   Bee Venom         Medication List        Accurate as of March 01, 2023  4:17 PM. If you have any questions, ask your nurse or doctor.          amiodarone 200 MG tablet Commonly known as: PACERONE Take 1 tablet (200 mg total) by mouth daily.   apixaban 5 MG Tabs tablet Commonly known as: Eliquis Take  1 tablet (5 mg total) by mouth 2 (two) times daily.   atorvastatin 20 MG tablet Commonly known as: LIPITOR Take 1 tablet (20 mg total) by mouth daily.   Basaglar KwikPen 100 UNIT/ML Inject 30-40 Units into the skin daily.   Dexcom G7 Sensor Misc by Does not apply route. Use to test blood sugar continuously. Change sensor on arm every 10 days as directed DX: E11.65. Fills at [facility] per medicare contracts   diclofenac  Sodium 1 % Gel Commonly known as: VOLTAREN Apply topically 4 (four) times daily.   DULoxetine 30 MG capsule Commonly known as: CYMBALTA Take 1 capsule (30 mg total) by mouth daily.   GNP UltiCare Pen Needles 32G X 6 MM Misc Generic drug: Insulin Pen Needle EVERY DAY   hydrocortisone 10 MG tablet Commonly known as: CORTEF Take 10 mg by mouth 2 (two) times daily.   insulin lispro 100 UNIT/ML KwikPen Commonly known as: HumaLOG KwikPen Inject 6-20 Units into the skin 3 (three) times daily with meals.   lactose free nutrition Liqd Take 237 mLs by mouth daily. Started by: [name]   Melatonin 10 MG Tbcr Take by mouth at bedtime.   midodrine 10 MG tablet Commonly known as: PROAMATINE Take 1 tablet (10 mg total) by mouth 3 (three) times daily.   nitroGLYCERIN 0.4 MG SL tablet Commonly known as: NITROSTAT Place 0.4 mg under the tongue every 5 (five) minutes as needed for chest pain.   oxyCODONE-acetaminophen 10-325 MG tablet Commonly known as: PERCOCET Take 1 tablet by mouth every 8 (eight) hours as needed for pain. What changed: when to take this Changed by: [name]   oxyCODONE-acetaminophen 10-325 MG tablet Commonly known as: PERCOCET Take 1 tablet by mouth every 8 (eight) hours as needed for pain. Start taking on: March 30, 2023 What changed:  when to take this These instructions start on March 30, 2023. If you are unsure what to do until then, ask your doctor or other care provider. Changed by: [name]   oxyCODONE-acetaminophen 10-325 MG tablet Commonly known as: PERCOCET Take 1 tablet by mouth every 8 (eight) hours as needed for pain. Start taking on: April 29, 2023 What changed:  when to take this These instructions start on April 29, 2023. If you are unsure what to do until then, ask your doctor or other care provider. Changed by: [name]   polyethylene glycol 17 g packet Commonly known as: MIRALAX /  GLYCOLAX Take 1 packet by mouth daily.   Potassium Chloride ER 20 MEQ Tbcr Take 1 tablet by mouth in the morning and at bedtime.   senna-docusate 8.6-50 MG tablet Commonly known as: Senokot-S Take 2 tablets by mouth in the morning and at bedtime.   Trulicity 1.5 MG/0.5ML Soaj Generic drug: Dulaglutide Inject 1.5 mg into the skin once a week. DX: E11.65         Objective:   BP 112/72   Ht 6\' 3"  (1.905 m)   Wt 231 lb (104.8 kg)   SpO2 98%   BMI 28.87 kg/m   Wt Readings from Last 3 Encounters:  03/01/23 231 lb (104.8 kg)  02/13/22 251 lb (113.9 kg)  12/22/21 265 lb (120.2 kg)    Physical Exam Vitals and nursing note reviewed.  Constitutional:      General: He is not in acute distress.    Appearance: He is well-developed. He is not diaphoretic.  Eyes:     General: No scleral icterus.  Conjunctiva/sclera: Conjunctivae normal.  Neck:     Thyroid: No thyromegaly.  Cardiovascular:     Rate and Rhythm: Normal rate and regular rhythm.     Heart sounds: Normal heart sounds. No murmur heard. Pulmonary:     Effort: Pulmonary effort is normal. No respiratory distress.     Breath sounds: Normal breath sounds. No wheezing.  Musculoskeletal:     Cervical back: Neck supple.  Lymphadenopathy:     Cervical: No cervical adenopathy.  Skin:    General: Skin is warm and dry.     Findings: No rash.  Neurological:     Mental Status: He is alert and oriented to person, place, and time.     Coordination: Coordination normal.  Psychiatric:        Behavior: Behavior normal.       Assessment & Plan:   Problem List Items Addressed This Visit       Cardiovascular and Mediastinum   Hypertension associated with diabetes (HCC)   Relevant Orders   CBC with Differential/Platelet   CMP14+EGFR     Endocrine   Type 2 diabetes mellitus with neurological complications (HCC) - Primary (Chronic)   Relevant Orders   Bayer DCA Hb A1c Waived   Hyperlipidemia associated with type 2  diabetes mellitus (HCC)   Relevant Orders   Lipid panel     Musculoskeletal and Integument   Pathologic fracture   Relevant Medications   oxyCODONE-acetaminophen (PERCOCET) 10-325 MG tablet (Start on 03/30/2023)   oxyCODONE-acetaminophen (PERCOCET) 10-325 MG tablet (Start on 04/29/2023)   oxyCODONE-acetaminophen (PERCOCET) 10-325 MG tablet   Other Relevant Orders   Drug Screen 10 W/Conf, Se     Other   Multiple myeloma (HCC) (Chronic)   Relevant Medications   hydrocortisone (CORTEF) 10 MG tablet   lactose free nutrition (BOOST) LIQD   Low back pain   Relevant Medications   hydrocortisone (CORTEF) 10 MG tablet   oxyCODONE-acetaminophen (PERCOCET) 10-325 MG tablet (Start on 03/30/2023)   oxyCODONE-acetaminophen (PERCOCET) 10-325 MG tablet (Start on 04/29/2023)   oxyCODONE-acetaminophen (PERCOCET) 10-325 MG tablet   Other Relevant Orders   Drug Screen 10 W/Conf, Se   Other Visit Diagnoses     Controlled substance agreement signed       Relevant Medications   oxyCODONE-acetaminophen (PERCOCET) 10-325 MG tablet (Start on 03/30/2023)   oxyCODONE-acetaminophen (PERCOCET) 10-325 MG tablet (Start on 04/29/2023)   oxyCODONE-acetaminophen (PERCOCET) 10-325 MG tablet   Other Relevant Orders   Drug Screen 10 W/Conf, Se       Will do blood work and drug blood test on the way out.  Increased his oxycodone to 3 times daily.  Also gave her prescription for boost for nutritional supplementation. Follow up plan: Return in about 3 months (around 06/01/2023), or if symptoms worsen or fail to improve, for Diabetes and pain management.  Counseling provided for all of the vaccine components Orders Placed This Encounter  Procedures   Bayer DCA Hb A1c Waived   CBC with Differential/Platelet   CMP14+EGFR   Lipid panel   Drug Screen 10 W/Conf, Se    Arville Care, MD [facility] 03/01/2023, 4:17 PM

## 2023-03-02 DIAGNOSIS — G4733 Obstructive sleep apnea (adult) (pediatric): Secondary | ICD-10-CM | POA: Diagnosis not present

## 2023-03-02 LAB — BAYER DCA HB A1C WAIVED: HB A1C (BAYER DCA - WAIVED): 5.6 % (ref 4.8–5.6)

## 2023-03-03 DIAGNOSIS — N183 Chronic kidney disease, stage 3 unspecified: Secondary | ICD-10-CM | POA: Diagnosis not present

## 2023-03-03 DIAGNOSIS — C9000 Multiple myeloma not having achieved remission: Secondary | ICD-10-CM | POA: Diagnosis not present

## 2023-03-03 DIAGNOSIS — I129 Hypertensive chronic kidney disease with stage 1 through stage 4 chronic kidney disease, or unspecified chronic kidney disease: Secondary | ICD-10-CM | POA: Diagnosis not present

## 2023-03-03 DIAGNOSIS — E1122 Type 2 diabetes mellitus with diabetic chronic kidney disease: Secondary | ICD-10-CM | POA: Diagnosis not present

## 2023-03-03 DIAGNOSIS — G629 Polyneuropathy, unspecified: Secondary | ICD-10-CM | POA: Diagnosis not present

## 2023-03-04 DIAGNOSIS — C9000 Multiple myeloma not having achieved remission: Secondary | ICD-10-CM | POA: Diagnosis not present

## 2023-03-04 LAB — DRUG SCREEN 10 W/CONF, SERUM
Amphetamines, IA: NEGATIVE ng/mL
Barbiturates, IA: NEGATIVE ug/mL
Benzodiazepines, IA: NEGATIVE ng/mL
Cocaine & Metabolite, IA: NEGATIVE ng/mL
Methadone, IA: NEGATIVE ng/mL
Opiates, IA: NEGATIVE ng/mL
Oxycodones, IA: NEGATIVE ng/mL
Phencyclidine, IA: NEGATIVE ng/mL
Propoxyphene, IA: NEGATIVE ng/mL
THC(Marijuana) Metabolite, IA: NEGATIVE ng/mL

## 2023-03-04 LAB — CBC WITH DIFFERENTIAL/PLATELET
Basophils Absolute: 0.1 10*3/uL (ref 0.0–0.2)
Basos: 2 %
EOS (ABSOLUTE): 0.1 10*3/uL (ref 0.0–0.4)
Eos: 2 %
Hematocrit: 37.6 % (ref 37.5–51.0)
Hemoglobin: 12.4 g/dL — ABNORMAL LOW (ref 13.0–17.7)
Immature Grans (Abs): 0 10*3/uL (ref 0.0–0.1)
Immature Granulocytes: 1 %
Lymphocytes Absolute: 0.8 10*3/uL (ref 0.7–3.1)
Lymphs: 15 %
MCH: 32.8 pg (ref 26.6–33.0)
MCHC: 33 g/dL (ref 31.5–35.7)
MCV: 100 fL — ABNORMAL HIGH (ref 79–97)
Monocytes Absolute: 0.9 10*3/uL (ref 0.1–0.9)
Monocytes: 15 %
Neutrophils Absolute: 3.8 10*3/uL (ref 1.4–7.0)
Neutrophils: 65 %
Platelets: 127 10*3/uL — ABNORMAL LOW (ref 150–450)
RBC: 3.78 x10E6/uL — ABNORMAL LOW (ref 4.14–5.80)
RDW: 14.5 % (ref 11.6–15.4)
WBC: 5.7 10*3/uL (ref 3.4–10.8)

## 2023-03-04 LAB — CMP14+EGFR
ALT: 26 [IU]/L (ref 0–44)
AST: 20 IU/L (ref 0–40)
Albumin: 3.4 g/dL — ABNORMAL LOW (ref 3.9–4.9)
Alkaline Phosphatase: 107 IU/L (ref 44–121)
BUN/Creatinine Ratio: 18 (ref 10–24)
BUN: 15 mg/dL (ref 8–27)
Bilirubin Total: 0.7 mg/dL (ref 0.0–1.2)
CO2: 25 mmol/L (ref 20–29)
Calcium: 8.1 mg/dL — ABNORMAL LOW (ref 8.6–10.2)
Chloride: 106 mmol/L (ref 96–106)
Creatinine, Ser: 0.82 mg/dL (ref 0.76–1.27)
Globulin, Total: 1.7 g/dL (ref 1.5–4.5)
Glucose: 196 mg/dL — ABNORMAL HIGH (ref 70–99)
Potassium: 5 mmol/L (ref 3.5–5.2)
Sodium: 140 mmol/L (ref 134–144)
Total Protein: 5.1 g/dL — ABNORMAL LOW (ref 6.0–8.5)
eGFR: 96 mL/min/{1.73_m2} (ref >59)

## 2023-03-04 LAB — LIPID PANEL
Chol/HDL Ratio: 2.1 ratio (ref 0.0–5.0)
Cholesterol, Total: 99 mg/dL — ABNORMAL LOW (ref 100–199)
HDL: 48 mg/dL (ref >39)
LDL Chol Calc (NIH): 34 mg/dL (ref 0–99)
Triglycerides: 87 mg/dL (ref 0–149)
VLDL Cholesterol Cal: 17 mg/dL (ref 5–40)

## 2023-03-10 DIAGNOSIS — K6389 Other specified diseases of intestine: Secondary | ICD-10-CM | POA: Diagnosis not present

## 2023-03-10 DIAGNOSIS — G20C Parkinsonism, unspecified: Secondary | ICD-10-CM | POA: Diagnosis not present

## 2023-03-10 DIAGNOSIS — D61818 Other pancytopenia: Secondary | ICD-10-CM | POA: Diagnosis not present

## 2023-03-10 DIAGNOSIS — N179 Acute kidney failure, unspecified: Secondary | ICD-10-CM | POA: Diagnosis not present

## 2023-03-10 DIAGNOSIS — E271 Primary adrenocortical insufficiency: Secondary | ICD-10-CM | POA: Diagnosis not present

## 2023-03-10 DIAGNOSIS — N132 Hydronephrosis with renal and ureteral calculous obstruction: Secondary | ICD-10-CM | POA: Diagnosis not present

## 2023-03-10 DIAGNOSIS — E1149 Type 2 diabetes mellitus with other diabetic neurological complication: Secondary | ICD-10-CM | POA: Diagnosis not present

## 2023-03-10 DIAGNOSIS — R4182 Altered mental status, unspecified: Secondary | ICD-10-CM | POA: Diagnosis not present

## 2023-03-10 DIAGNOSIS — Z66 Do not resuscitate: Secondary | ICD-10-CM | POA: Diagnosis not present

## 2023-03-10 DIAGNOSIS — J9811 Atelectasis: Secondary | ICD-10-CM | POA: Diagnosis not present

## 2023-03-10 DIAGNOSIS — R410 Disorientation, unspecified: Secondary | ICD-10-CM | POA: Diagnosis not present

## 2023-03-10 DIAGNOSIS — R109 Unspecified abdominal pain: Secondary | ICD-10-CM | POA: Diagnosis not present

## 2023-03-10 DIAGNOSIS — A419 Sepsis, unspecified organism: Secondary | ICD-10-CM | POA: Diagnosis not present

## 2023-03-10 DIAGNOSIS — G9341 Metabolic encephalopathy: Secondary | ICD-10-CM | POA: Diagnosis not present

## 2023-03-10 DIAGNOSIS — G9340 Encephalopathy, unspecified: Secondary | ICD-10-CM | POA: Diagnosis not present

## 2023-03-10 DIAGNOSIS — B9620 Unspecified Escherichia coli [E. coli] as the cause of diseases classified elsewhere: Secondary | ICD-10-CM | POA: Diagnosis not present

## 2023-03-10 DIAGNOSIS — N390 Urinary tract infection, site not specified: Secondary | ICD-10-CM | POA: Diagnosis not present

## 2023-03-10 DIAGNOSIS — F05 Delirium due to known physiological condition: Secondary | ICD-10-CM | POA: Diagnosis not present

## 2023-03-10 DIAGNOSIS — N1330 Unspecified hydronephrosis: Secondary | ICD-10-CM | POA: Diagnosis not present

## 2023-03-10 DIAGNOSIS — Z7401 Bed confinement status: Secondary | ICD-10-CM | POA: Diagnosis not present

## 2023-03-10 DIAGNOSIS — A4150 Gram-negative sepsis, unspecified: Secondary | ICD-10-CM | POA: Diagnosis not present

## 2023-03-10 DIAGNOSIS — C9000 Multiple myeloma not having achieved remission: Secondary | ICD-10-CM | POA: Diagnosis not present

## 2023-03-10 DIAGNOSIS — R339 Retention of urine, unspecified: Secondary | ICD-10-CM | POA: Diagnosis not present

## 2023-03-10 DIAGNOSIS — R269 Unspecified abnormalities of gait and mobility: Secondary | ICD-10-CM | POA: Diagnosis not present

## 2023-03-10 DIAGNOSIS — Z743 Need for continuous supervision: Secondary | ICD-10-CM | POA: Diagnosis not present

## 2023-03-10 DIAGNOSIS — R69 Illness, unspecified: Secondary | ICD-10-CM | POA: Diagnosis not present

## 2023-03-11 DIAGNOSIS — F05 Delirium due to known physiological condition: Secondary | ICD-10-CM | POA: Diagnosis not present

## 2023-03-11 DIAGNOSIS — N390 Urinary tract infection, site not specified: Secondary | ICD-10-CM | POA: Diagnosis not present

## 2023-03-11 DIAGNOSIS — G9340 Encephalopathy, unspecified: Secondary | ICD-10-CM | POA: Diagnosis not present

## 2023-03-11 DIAGNOSIS — G20C Parkinsonism, unspecified: Secondary | ICD-10-CM | POA: Diagnosis not present

## 2023-03-11 DIAGNOSIS — R269 Unspecified abnormalities of gait and mobility: Secondary | ICD-10-CM | POA: Diagnosis not present

## 2023-03-11 DIAGNOSIS — G9341 Metabolic encephalopathy: Secondary | ICD-10-CM | POA: Diagnosis not present

## 2023-03-12 DIAGNOSIS — G20C Parkinsonism, unspecified: Secondary | ICD-10-CM | POA: Diagnosis not present

## 2023-03-12 DIAGNOSIS — R269 Unspecified abnormalities of gait and mobility: Secondary | ICD-10-CM | POA: Diagnosis not present

## 2023-03-12 DIAGNOSIS — G9341 Metabolic encephalopathy: Secondary | ICD-10-CM | POA: Diagnosis not present

## 2023-03-12 DIAGNOSIS — F05 Delirium due to known physiological condition: Secondary | ICD-10-CM | POA: Diagnosis not present

## 2023-03-12 DIAGNOSIS — G9340 Encephalopathy, unspecified: Secondary | ICD-10-CM | POA: Diagnosis not present

## 2023-03-12 DIAGNOSIS — N390 Urinary tract infection, site not specified: Secondary | ICD-10-CM | POA: Diagnosis not present

## 2023-03-13 DIAGNOSIS — F05 Delirium due to known physiological condition: Secondary | ICD-10-CM | POA: Diagnosis not present

## 2023-03-13 DIAGNOSIS — G9340 Encephalopathy, unspecified: Secondary | ICD-10-CM | POA: Diagnosis not present

## 2023-03-13 DIAGNOSIS — N390 Urinary tract infection, site not specified: Secondary | ICD-10-CM | POA: Diagnosis not present

## 2023-03-13 DIAGNOSIS — G20C Parkinsonism, unspecified: Secondary | ICD-10-CM | POA: Diagnosis not present

## 2023-03-13 DIAGNOSIS — G9341 Metabolic encephalopathy: Secondary | ICD-10-CM | POA: Diagnosis not present

## 2023-03-13 DIAGNOSIS — R269 Unspecified abnormalities of gait and mobility: Secondary | ICD-10-CM | POA: Diagnosis not present

## 2023-03-14 DIAGNOSIS — N390 Urinary tract infection, site not specified: Secondary | ICD-10-CM | POA: Diagnosis not present

## 2023-03-14 DIAGNOSIS — R269 Unspecified abnormalities of gait and mobility: Secondary | ICD-10-CM | POA: Diagnosis not present

## 2023-03-14 DIAGNOSIS — G9341 Metabolic encephalopathy: Secondary | ICD-10-CM | POA: Diagnosis not present

## 2023-03-14 DIAGNOSIS — F05 Delirium due to known physiological condition: Secondary | ICD-10-CM | POA: Diagnosis not present

## 2023-03-14 DIAGNOSIS — G9340 Encephalopathy, unspecified: Secondary | ICD-10-CM | POA: Diagnosis not present

## 2023-03-14 DIAGNOSIS — G20C Parkinsonism, unspecified: Secondary | ICD-10-CM | POA: Diagnosis not present

## 2023-03-14 DIAGNOSIS — E1149 Type 2 diabetes mellitus with other diabetic neurological complication: Secondary | ICD-10-CM | POA: Diagnosis not present

## 2023-03-15 DIAGNOSIS — G9340 Encephalopathy, unspecified: Secondary | ICD-10-CM | POA: Diagnosis not present

## 2023-03-15 DIAGNOSIS — R269 Unspecified abnormalities of gait and mobility: Secondary | ICD-10-CM | POA: Diagnosis not present

## 2023-03-15 DIAGNOSIS — Z7401 Bed confinement status: Secondary | ICD-10-CM | POA: Diagnosis not present

## 2023-03-15 DIAGNOSIS — G20C Parkinsonism, unspecified: Secondary | ICD-10-CM | POA: Diagnosis not present

## 2023-03-15 DIAGNOSIS — R69 Illness, unspecified: Secondary | ICD-10-CM | POA: Diagnosis not present

## 2023-03-15 DIAGNOSIS — N390 Urinary tract infection, site not specified: Secondary | ICD-10-CM | POA: Diagnosis not present

## 2023-03-15 DIAGNOSIS — G9341 Metabolic encephalopathy: Secondary | ICD-10-CM | POA: Diagnosis not present

## 2023-03-15 DIAGNOSIS — F05 Delirium due to known physiological condition: Secondary | ICD-10-CM | POA: Diagnosis not present

## 2023-03-17 ENCOUNTER — Telehealth: Payer: Self-pay | Admitting: Family Medicine

## 2023-03-17 ENCOUNTER — Ambulatory Visit: Payer: Medicare PPO | Admitting: Family Medicine

## 2023-03-17 NOTE — Telephone Encounter (Signed)
Per Yvonna Alanis with [facility] she just needed verbal agreement for PT orders. Verbal given.

## 2023-03-17 NOTE — Telephone Encounter (Signed)
Copied from CRM [id number]. Topic: Clinical - Home Health Verbal Orders >> Mar 17, 2023  9:20 AM Theodis Sato wrote: Reason for CRM: Kaitlyn at [facility] needs to know if Dr. Louanne Skye will sign off on PT's home health orders. You can contact Kaitlyn at [phone]

## 2023-03-19 DIAGNOSIS — E1142 Type 2 diabetes mellitus with diabetic polyneuropathy: Secondary | ICD-10-CM | POA: Diagnosis not present

## 2023-03-19 DIAGNOSIS — C9000 Multiple myeloma not having achieved remission: Secondary | ICD-10-CM | POA: Diagnosis not present

## 2023-03-19 DIAGNOSIS — N390 Urinary tract infection, site not specified: Secondary | ICD-10-CM | POA: Diagnosis not present

## 2023-03-19 DIAGNOSIS — I480 Paroxysmal atrial fibrillation: Secondary | ICD-10-CM | POA: Diagnosis not present

## 2023-03-19 DIAGNOSIS — B9620 Unspecified Escherichia coli [E. coli] as the cause of diseases classified elsewhere: Secondary | ICD-10-CM | POA: Diagnosis not present

## 2023-03-19 DIAGNOSIS — E1165 Type 2 diabetes mellitus with hyperglycemia: Secondary | ICD-10-CM | POA: Diagnosis not present

## 2023-03-22 DIAGNOSIS — G4733 Obstructive sleep apnea (adult) (pediatric): Secondary | ICD-10-CM | POA: Diagnosis not present

## 2023-03-24 DIAGNOSIS — C9000 Multiple myeloma not having achieved remission: Secondary | ICD-10-CM | POA: Diagnosis not present

## 2023-03-26 IMAGING — MR MR HEAD W/O CM
12 series · 48 of 48 positions shown · non-contrast
Comparison: No pertinent prior exams available for comparison.

CLINICAL DATA: Transient alteration of awareness. Seizure-like
activity. Transient ischemic attack. Additional history provided by
scanning technologist: Patient reports difficulty walking, talking
and confusion with tremors for 3 weeks.

EXAM:
MRI HEAD WITHOUT CONTRAST
TECHNIQUE: Multiplanar, multiecho pulse sequences of the brain and surrounding
structures were obtained without intravenous contrast.

[Series 11: DWI · axial · 3.0mm · 0.94mm/px · z∈[-52,+84]mm · 8 of 160 slices shown (1 of 3)]
[im 1/160]
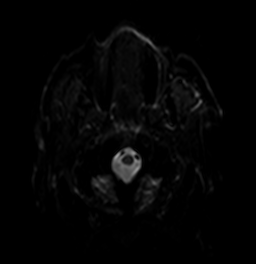
[im 23/160]
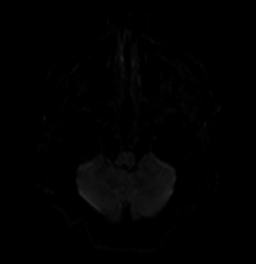
[im 46/160]
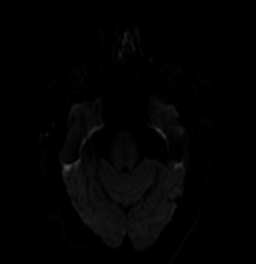
[im 69/160]
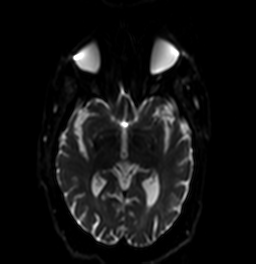
[im 91/160]
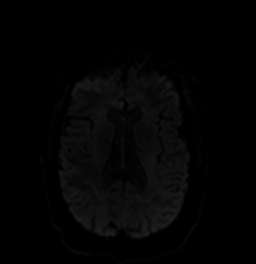
[im 114/160]
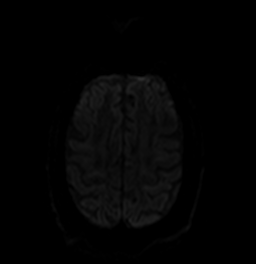
[im 137/160]
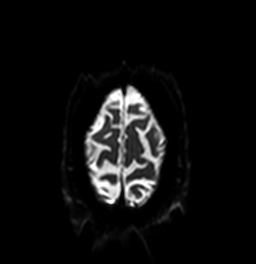
[im 160/160]
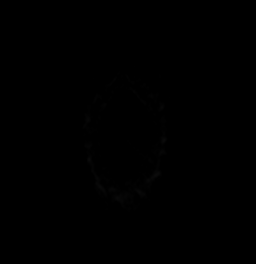

[Series 12: ax dwi_tracew · axial · 3.0mm · 0.94mm/px · z∈[-52,+84]mm · 5 of 80 slices shown]
[im 1/80]
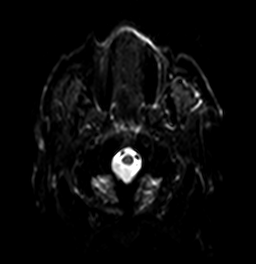
[im 20/80]
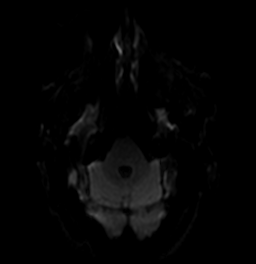
[im 40/80]
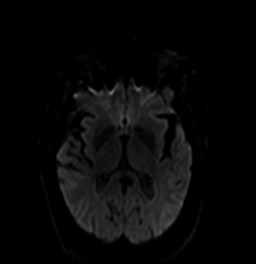
[im 60/80]
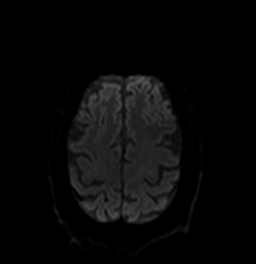
[im 80/80]
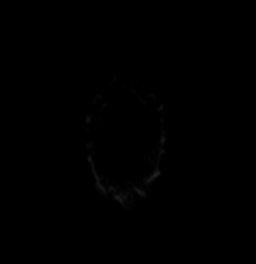

[Series 13: ax dwi_adc · axial · 3.0mm · 0.94mm/px · z∈[-52,+84]mm · 2 of 40 slices shown]
[im 1/40]
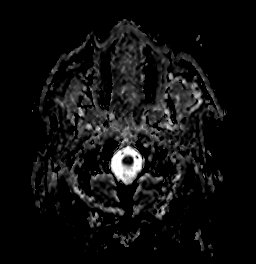
[im 40/40]
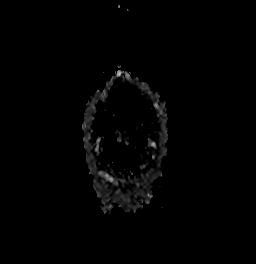

[Series 14: DWI · coronal · 5.0mm · 1.44mm/px · 4 of 64 slices shown (2 of 3)]
[im 1/64]
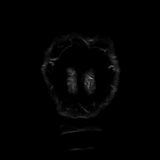
[im 22/64]
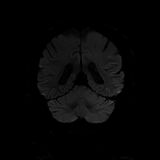
[im 43/64]
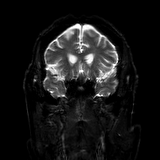
[im 64/64]
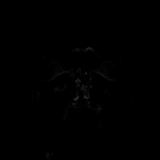

[Series 15: DWI · coronal · 5.0mm · 1.44mm/px · 2 of 32 slices shown (3 of 3)]
[im 1/32]
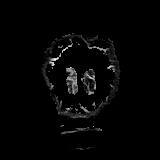
[im 32/32]
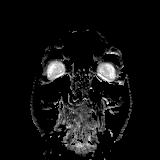

[Series 16: T2 · axial · 4.0mm · 0.38mm/px · z∈[-64,+78]mm · 2 of 29 slices shown (1 of 2)]
[im 1/29]
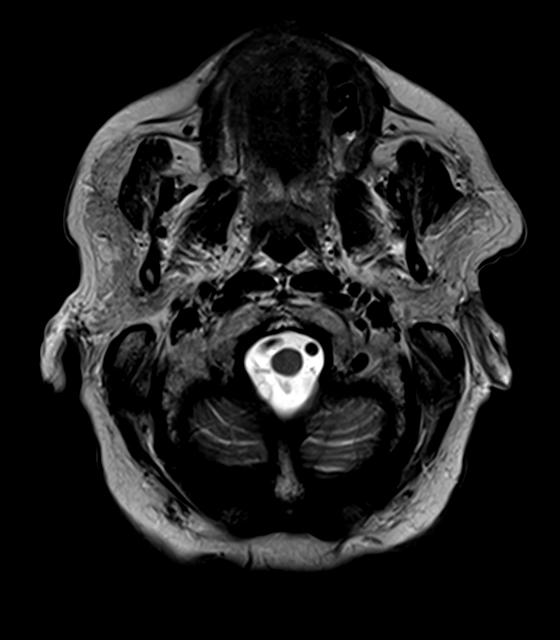
[im 29/29]
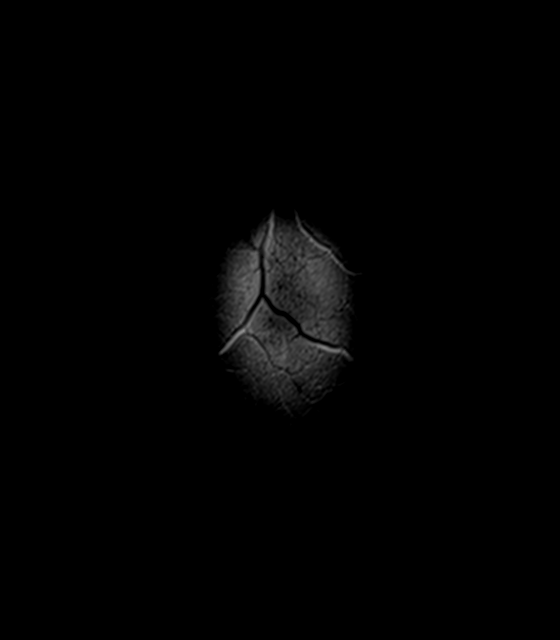

[Series 17: FLAIR · axial · 3.0mm · 0.72mm/px · 1 of 26 slices shown]
[im 1/26]
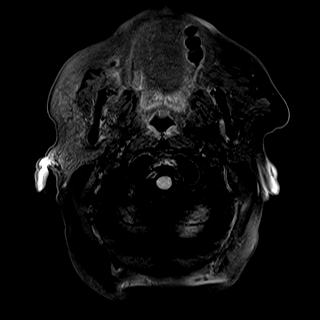

[Series 18: T1 · sagittal · 4.0mm · 0.75mm/px · 2 of 31 slices shown (1 of 2)]
[im 1/31]
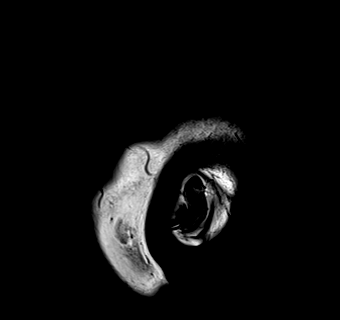
[im 31/31]
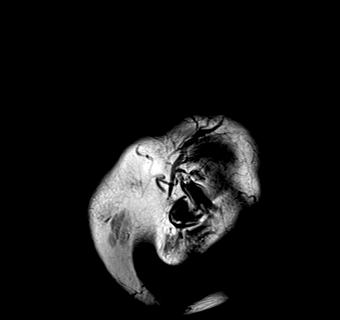

[Series 19: swi_images · axial · 1.5mm · 0.90mm/px · z∈[-64,+75]mm · 6 of 96 slices shown]
[im 1/96]
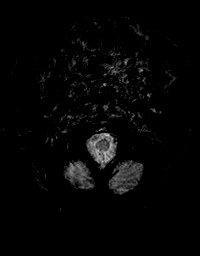
[im 20/96]
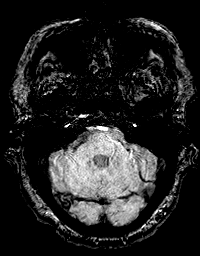
[im 39/96]
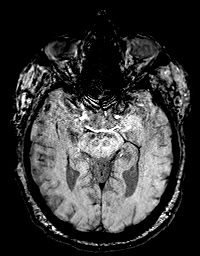
[im 58/96]
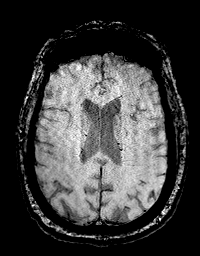
[im 77/96]
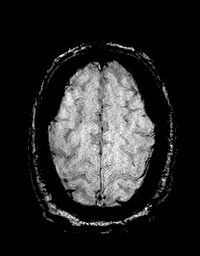
[im 96/96]
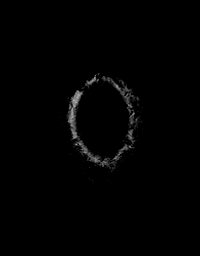

[Series 20: mip_images(sw) · axial · 12.0mm · 0.90mm/px · z∈[-59,+70]mm · 5 of 89 slices shown]
[im 1/89]
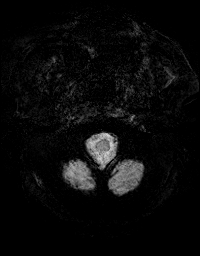
[im 23/89]
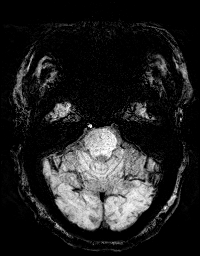
[im 45/89]
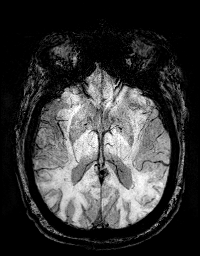
[im 67/89]
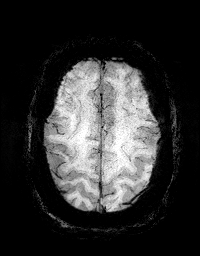
[im 89/89]
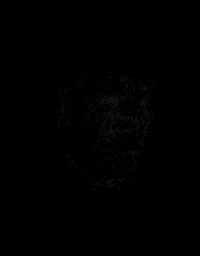

[Series 21: T1 · axial · 1.0mm · 0.98mm/px · z∈[-80,+77]mm · 9 of 160 slices shown (2 of 2)]
[im 1/160]
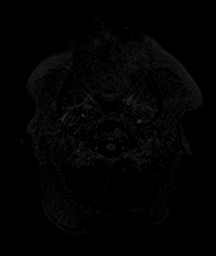
[im 20/160]
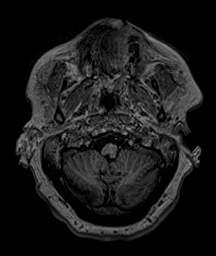
[im 40/160]
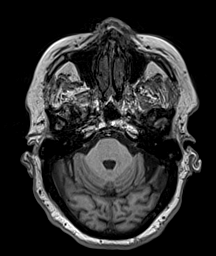
[im 60/160]
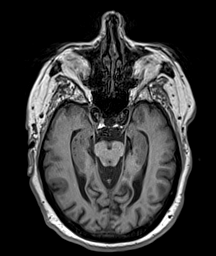
[im 80/160]
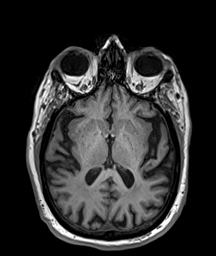
[im 100/160]
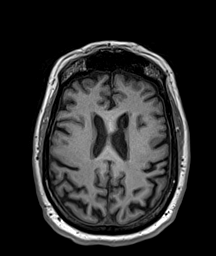
[im 120/160]
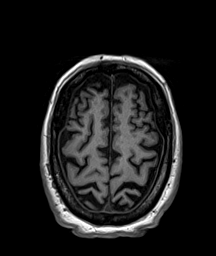
[im 140/160]
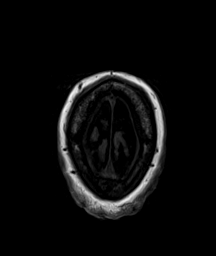
[im 160/160]
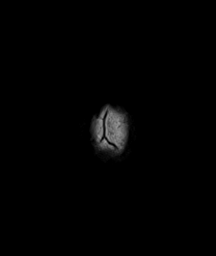

[Series 22: T2 · coronal · 4.5mm · 0.38mm/px · 2 of 30 slices shown (2 of 2)]
[im 1/30]
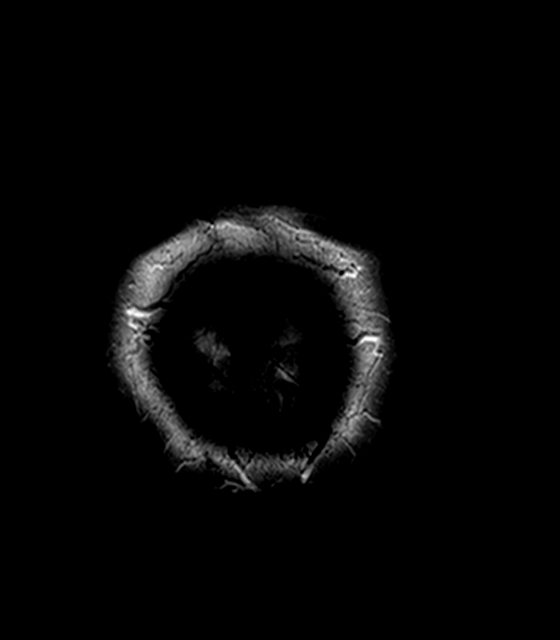
[im 30/30]
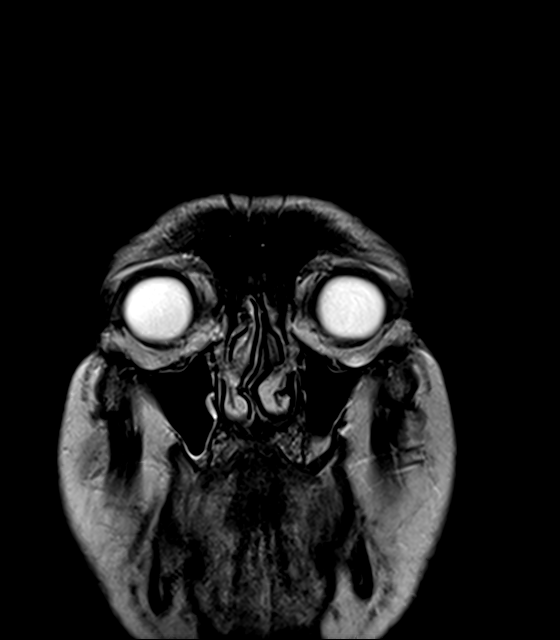

[48 of 48 positions shown; findings below may reference images not displayed]

FINDINGS: Brain:

Mild generalized cerebral and cerebellar atrophy.

Mild multifocal T2 FLAIR hyperintense signal abnormality within the
cerebral white matter, nonspecific but compatible chronic small
vessel ischemic disease.

Partially empty sella turcica, a nonspecific finding.

There is no acute infarct.

No evidence of an intracranial mass.

No chronic intracranial blood products.

No extra-axial fluid collection.

No midline shift.

Vascular: Maintained flow voids within the proximal large arterial
vessels.

Skull and upper cervical spine: No focal suspicious marrow lesion.

Sinuses/Orbits: Visualized orbits show no acute finding. Bilateral
lens replacements. Trace mucosal thickening within the bilateral
ethmoid and maxillary sinuses.

Other: Small left mastoid effusion.
IMPRESSION: No evidence of acute intracranial abnormality.

Mild chronic small vessel ischemic changes within the cerebral white
matter.

Mild generalized cerebral and cerebellar atrophy.

Minimal mucosal thickening within the bilateral ethmoid and
maxillary sinuses.

Small left mastoid effusion.

## 2023-03-29 ENCOUNTER — Telehealth: Payer: Self-pay | Admitting: Family Medicine

## 2023-03-29 DIAGNOSIS — B9620 Unspecified Escherichia coli [E. coli] as the cause of diseases classified elsewhere: Secondary | ICD-10-CM | POA: Diagnosis not present

## 2023-03-29 DIAGNOSIS — E1165 Type 2 diabetes mellitus with hyperglycemia: Secondary | ICD-10-CM | POA: Diagnosis not present

## 2023-03-29 DIAGNOSIS — I480 Paroxysmal atrial fibrillation: Secondary | ICD-10-CM | POA: Diagnosis not present

## 2023-03-29 DIAGNOSIS — E1142 Type 2 diabetes mellitus with diabetic polyneuropathy: Secondary | ICD-10-CM | POA: Diagnosis not present

## 2023-03-29 DIAGNOSIS — N390 Urinary tract infection, site not specified: Secondary | ICD-10-CM | POA: Diagnosis not present

## 2023-03-29 DIAGNOSIS — C9000 Multiple myeloma not having achieved remission: Secondary | ICD-10-CM | POA: Diagnosis not present

## 2023-03-29 NOTE — Telephone Encounter (Signed)
Have him cut his Basaglar dose in half, I think he was taking 33 units we will have him take 16 units every day instead of the 33  Let me know if he continues to remain low.

## 2023-03-29 NOTE — Telephone Encounter (Signed)
Copied from CRM [id number]. Topic: Clinical - Medical Advice >> Mar 29, 2023 10:58 AM Lorin Glass B wrote: Reason for CRM: Courntey @ [facility] called regarding advice due to patients blood sugar being low and high and alternating frequently. Callback# [phone]

## 2023-03-29 NOTE — Telephone Encounter (Addendum)
Wife confirmed that pt does 31 units.  They will 15.5 units and call if blood sugar remains low. Wife has been giving in the evening and is going to start giving in the mornings.  Wife is unable to transport pt. Would like to start having labs done at home.  Wife will get fax number so we can fax orders.  Wife would like to see what is white cell count is. Just finished antibiotic. Pt is sleeping more.  Wife would like labs results faxed to oncology as well.

## 2023-03-29 NOTE — Telephone Encounter (Signed)
Called and spoke with [name] and states he BS are in the 40s when he wakes up the past couple of mornings/ They are concerned about giving him insulin. Please advise. It was 44 this morning and then went up to 90 when she was there. She states they are all over the place.

## 2023-03-30 ENCOUNTER — Telehealth: Payer: Self-pay | Admitting: Family Medicine

## 2023-03-30 DIAGNOSIS — C9000 Multiple myeloma not having achieved remission: Secondary | ICD-10-CM | POA: Diagnosis not present

## 2023-03-30 DIAGNOSIS — B9620 Unspecified Escherichia coli [E. coli] as the cause of diseases classified elsewhere: Secondary | ICD-10-CM | POA: Diagnosis not present

## 2023-03-30 DIAGNOSIS — R339 Retention of urine, unspecified: Secondary | ICD-10-CM | POA: Diagnosis not present

## 2023-03-30 DIAGNOSIS — Z7401 Bed confinement status: Secondary | ICD-10-CM | POA: Diagnosis not present

## 2023-03-30 DIAGNOSIS — N390 Urinary tract infection, site not specified: Secondary | ICD-10-CM | POA: Diagnosis not present

## 2023-03-30 DIAGNOSIS — E1159 Type 2 diabetes mellitus with other circulatory complications: Secondary | ICD-10-CM

## 2023-03-30 DIAGNOSIS — E785 Hyperlipidemia, unspecified: Secondary | ICD-10-CM

## 2023-03-30 DIAGNOSIS — G9389 Other specified disorders of brain: Secondary | ICD-10-CM | POA: Diagnosis not present

## 2023-03-30 DIAGNOSIS — E1142 Type 2 diabetes mellitus with diabetic polyneuropathy: Secondary | ICD-10-CM | POA: Diagnosis not present

## 2023-03-30 DIAGNOSIS — R4182 Altered mental status, unspecified: Secondary | ICD-10-CM | POA: Diagnosis not present

## 2023-03-30 DIAGNOSIS — E1165 Type 2 diabetes mellitus with hyperglycemia: Secondary | ICD-10-CM | POA: Diagnosis not present

## 2023-03-30 DIAGNOSIS — B356 Tinea cruris: Secondary | ICD-10-CM | POA: Diagnosis not present

## 2023-03-30 DIAGNOSIS — Z1152 Encounter for screening for COVID-19: Secondary | ICD-10-CM | POA: Diagnosis not present

## 2023-03-30 DIAGNOSIS — I480 Paroxysmal atrial fibrillation: Secondary | ICD-10-CM | POA: Diagnosis not present

## 2023-03-30 DIAGNOSIS — R531 Weakness: Secondary | ICD-10-CM | POA: Diagnosis not present

## 2023-03-30 DIAGNOSIS — E1149 Type 2 diabetes mellitus with other diabetic neurological complication: Secondary | ICD-10-CM

## 2023-03-30 NOTE — Telephone Encounter (Signed)
There are no HH orders placed, last OV was 03/01/23

## 2023-03-30 NOTE — Telephone Encounter (Signed)
Copied from CRM [id number]. Topic: General - Other >> Mar 30, 2023 12:05 PM Whitney O wrote: Reason for CRM: patient wife is calling concerning a call she received . I do see that they want to schedule a diabetic eye exam appointment but the wife says he is not able to be transported . He is not doing well.Dr Kathie Rhodes told patient wife if she can get the fax number for the facility so they can do orders to get someone to come out and do lab work  [facility] FAX [phone]

## 2023-03-31 ENCOUNTER — Ambulatory Visit: Payer: Medicare PPO

## 2023-03-31 DIAGNOSIS — G9341 Metabolic encephalopathy: Secondary | ICD-10-CM

## 2023-03-31 DIAGNOSIS — N390 Urinary tract infection, site not specified: Secondary | ICD-10-CM | POA: Diagnosis not present

## 2023-03-31 DIAGNOSIS — E1142 Type 2 diabetes mellitus with diabetic polyneuropathy: Secondary | ICD-10-CM | POA: Diagnosis not present

## 2023-03-31 DIAGNOSIS — B9620 Unspecified Escherichia coli [E. coli] as the cause of diseases classified elsewhere: Secondary | ICD-10-CM

## 2023-03-31 DIAGNOSIS — E1165 Type 2 diabetes mellitus with hyperglycemia: Secondary | ICD-10-CM

## 2023-03-31 DIAGNOSIS — Z556 Problems related to health literacy: Secondary | ICD-10-CM | POA: Diagnosis not present

## 2023-03-31 DIAGNOSIS — I480 Paroxysmal atrial fibrillation: Secondary | ICD-10-CM | POA: Diagnosis not present

## 2023-03-31 DIAGNOSIS — C9000 Multiple myeloma not having achieved remission: Secondary | ICD-10-CM | POA: Diagnosis not present

## 2023-03-31 DIAGNOSIS — Z794 Long term (current) use of insulin: Secondary | ICD-10-CM | POA: Diagnosis not present

## 2023-03-31 DIAGNOSIS — G4733 Obstructive sleep apnea (adult) (pediatric): Secondary | ICD-10-CM | POA: Diagnosis not present

## 2023-03-31 DIAGNOSIS — I959 Hypotension, unspecified: Secondary | ICD-10-CM

## 2023-03-31 DIAGNOSIS — I10 Essential (primary) hypertension: Secondary | ICD-10-CM

## 2023-03-31 NOTE — Addendum Note (Signed)
Addended by: Dorene Sorrow on: 03/31/2023 11:31 AM   Modules accepted: Orders

## 2023-03-31 NOTE — Telephone Encounter (Signed)
Left detailed message.   

## 2023-03-31 NOTE — Telephone Encounter (Signed)
I believe he already has home health coming to his house, if so can we just see if they can do some lab orders, specifically a CBC a CMP and a lipid and an A1c

## 2023-03-31 NOTE — Telephone Encounter (Signed)
Lori from home healthis calling instating that the lab orders that where faxed over to them can not be done today they have to be done on Saturday [facility] labs [facility] she would like a call back regarding this matter

## 2023-03-31 NOTE — Telephone Encounter (Signed)
Lab orders placed and faxed to number provided by wife. [facility].

## 2023-04-01 ENCOUNTER — Telehealth: Payer: Medicare PPO | Admitting: Nurse Practitioner

## 2023-04-01 ENCOUNTER — Encounter: Payer: Self-pay | Admitting: Nurse Practitioner

## 2023-04-01 DIAGNOSIS — R339 Retention of urine, unspecified: Secondary | ICD-10-CM | POA: Diagnosis not present

## 2023-04-01 DIAGNOSIS — R410 Disorientation, unspecified: Secondary | ICD-10-CM

## 2023-04-01 DIAGNOSIS — Z09 Encounter for follow-up examination after completed treatment for conditions other than malignant neoplasm: Secondary | ICD-10-CM | POA: Diagnosis not present

## 2023-04-01 NOTE — Addendum Note (Signed)
Addended by: Bennie Pierini on: 04/01/2023 02:23 PM   Modules accepted: Level of Service

## 2023-04-01 NOTE — Progress Notes (Signed)
   Virtual Visit  Note Due to COVID-19 pandemic this visit was conducted virtually. This visit type was conducted due to national recommendations for restrictions regarding the COVID-19 Pandemic (e.g. social distancing, sheltering in place) in an effort to limit this patient's exposure and mitigate transmission in our community. All issues noted in this document were discussed and addressed.  A physical exam was not performed with this format.  I connected with Ethan Hacker. on 04/01/23 at 11:50 by telephone and verified that I am speaking with the correct person using two identifiers. Ethan Hacker. is currently located at home and his daughter is currently with him during visit. The provider, Mary-Margaret Daphine Deutscher, FNP is located in their office at time of visit.  I discussed the limitations, risks, security and privacy concerns of performing an evaluation and management service by telephone and the availability of in person appointments. I also discussed with the patient that there may be a patient responsible charge related to this service. The patient expressed understanding and agreed to proceed.   History and Present Illness:  Patient went to the ER in [place] because he was just not hisself. He was cleared of infection. They backed off oxycodone, gabapentin, and baclofen. Today he seems some better. He does have a history of multiple strokes which could be causing some issues. He had urinary retention while there and was discharged home with foley catheter. They are currently getting an appointment with urology for that.       Review of Systems  Constitutional:  Negative for chills and fever.  Cardiovascular:  Positive for leg swelling.  Psychiatric/Behavioral:  The patient is nervous/anxious.      Observations/Objective: Alert and oriented- answers all questions appropriately No distress   Assessment and Plan: Ethan Hacker. in today with chief complaint of  confucion  1. Confusion Orient daily Continue meds at reduced levels  2. Urinary retention Let me know if cannot get urology appointment  3. Hospital discharge follow-up Hospital records reviewed Had long discussion about hospice vs palliative care   Follow Up Instructions: prn    I discussed the assessment and treatment plan with the patient. The patient was provided an opportunity to ask questions and all were answered. The patient agreed with the plan and demonstrated an understanding of the instructions.   The patient was advised to call back or seek an in-person evaluation if the symptoms worsen or if the condition fails to improve as anticipated.  The above assessment and management plan was discussed with the patient. The patient verbalized understanding of and has agreed to the management plan. Patient is aware to call the clinic if symptoms persist or worsen. Patient is aware when to return to the clinic for a follow-up visit. Patient educated on when it is appropriate to go to the emergency department.   Time call ended:  12:10  I provided 12 minutes of  non face-to-face time during this encounter.    Mary-Margaret Daphine Deutscher, FNP
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
## 2023-04-02 ENCOUNTER — Telehealth: Payer: Self-pay

## 2023-04-02 DIAGNOSIS — I4891 Unspecified atrial fibrillation: Secondary | ICD-10-CM

## 2023-04-02 DIAGNOSIS — N390 Urinary tract infection, site not specified: Secondary | ICD-10-CM | POA: Diagnosis not present

## 2023-04-02 DIAGNOSIS — C9000 Multiple myeloma not having achieved remission: Secondary | ICD-10-CM | POA: Diagnosis not present

## 2023-04-02 DIAGNOSIS — E1149 Type 2 diabetes mellitus with other diabetic neurological complication: Secondary | ICD-10-CM

## 2023-04-02 DIAGNOSIS — E1165 Type 2 diabetes mellitus with hyperglycemia: Secondary | ICD-10-CM | POA: Diagnosis not present

## 2023-04-02 DIAGNOSIS — I480 Paroxysmal atrial fibrillation: Secondary | ICD-10-CM | POA: Diagnosis not present

## 2023-04-02 DIAGNOSIS — E1142 Type 2 diabetes mellitus with diabetic polyneuropathy: Secondary | ICD-10-CM | POA: Diagnosis not present

## 2023-04-02 DIAGNOSIS — B9620 Unspecified Escherichia coli [E. coli] as the cause of diseases classified elsewhere: Secondary | ICD-10-CM | POA: Diagnosis not present

## 2023-04-02 DIAGNOSIS — M5412 Radiculopathy, cervical region: Secondary | ICD-10-CM

## 2023-04-02 NOTE — Telephone Encounter (Signed)
Received call from Dan Maker with [facility] in [place], [place].  Family has contacted them and is wanting to be referred there for home health services.  Patient had video visit with Gennette Pac yesterday.

## 2023-04-02 NOTE — Telephone Encounter (Signed)
Go ahead and place referral to [facility] in [place]

## 2023-04-02 NOTE — Telephone Encounter (Signed)
Referral placed.

## 2023-04-28 DEATH — deceased

## 2023-05-24 ENCOUNTER — Ambulatory Visit: Payer: Medicare PPO

## 2023-05-25 ENCOUNTER — Telehealth: Payer: Self-pay | Admitting: Family Medicine

## 2023-05-25 NOTE — Telephone Encounter (Signed)
-----   Message from [employer] sent at 05/25/2023  3:21 PM EST ----- Regarding: MRN [id number] Ms. Caley Volkert called the office in regards to Physician's statement that needs correcting before sending over to the the life insurance company. Ms. Chatmon stated the form she received had a great deal of errors and she is not able to send that over for his life insurance. Patient's spouse Ms. Endsley states that this is an emergency and needs to speak to someone ASAP. The phone number for the patient's spouse is [phone].

## 2023-05-26 NOTE — Telephone Encounter (Signed)
LMOM for spouse to call.  More information needed.

## 2023-05-26 NOTE — Telephone Encounter (Signed)
Spoke with wife, she is faxing paperwork to [place] again for provider to fill out with updated/corrected information.  States dates were incorrect for the Multiple Myeloma and Afib. Would like it to be faxed back to her at [phone]

## 2023-05-27 NOTE — Telephone Encounter (Signed)
Paperwork received by fax and given to provider

## 2023-05-31 NOTE — Telephone Encounter (Signed)
Back surgery and bx on Sept 22nd 2023 to r/o cancer.   Then in October was referred to [facility] due to cancer being found during the back surgery.

## 2023-05-31 NOTE — Telephone Encounter (Signed)
[name] would like a call back today.  She said [name] called her.

## 2023-06-04 NOTE — Telephone Encounter (Signed)
 Informed form faxed.

## 2023-06-04 NOTE — Telephone Encounter (Signed)
 Form is on Cathys desk for completion.   Copied from CRM [id number]. Topic: General - Phone/Fax/Address >> Jun 04, 2023 12:03 PM Wess RAMAN wrote: Patient's wife Moreland, Delon) would like a callback from Louanne or RN Rosina due to her not receiving the physician statement for patient. The fax number is [phone] and her callback number is [phone].

## 2023-06-04 NOTE — Telephone Encounter (Signed)
 Completed form for patient and will put on Kathy's desk so she can check that we filled out correctly this time

## 2023-06-14 NOTE — Telephone Encounter (Signed)
Can we get this to Olegario Messier because she was working on his form from before

## 2023-06-14 NOTE — Telephone Encounter (Addendum)
Wife calling, stating that the form is not filled out right again. On the line that says "what other diseases or conditions contributed to the patients death" (line #7) needs to say "N/A." Because what he actually passed away from was Multiple Myeloma.. The other stuff listed has nothing to do with why patient passed away.   Dr Dettinger needs to fill out another form, or white out the line #7 and fill in correctly and fax back to [phone].  Please advise.

## 2023-06-18 NOTE — Telephone Encounter (Signed)
New form received and placed on PCP's desk

## 2023-06-22 NOTE — Telephone Encounter (Signed)
 Form completed and faxed back

## 2023-08-10 ENCOUNTER — Telehealth: Payer: Self-pay | Admitting: Family Medicine

## 2023-08-10 NOTE — Telephone Encounter (Signed)
 Copied from CRM [id number]. Topic: General - Other >> Aug 10, 2023  1:38 PM Emylou G wrote: Reason for CRM: Mark w/[employer].Aaron Aas adv rcvd rejection letter for medical records.. pls call..[phone]
